# Patient Record
Sex: Male | Born: 1960 | Race: White | Hispanic: No | Marital: Married | Smoking: Former smoker
Health system: Southern US, Community
[De-identification: ages and names within clinical notes are randomized; demographics above are authoritative.]

## PROBLEM LIST (undated history)

## (undated) DIAGNOSIS — K219 Gastro-esophageal reflux disease without esophagitis: Secondary | ICD-10-CM

## (undated) DIAGNOSIS — M1990 Unspecified osteoarthritis, unspecified site: Secondary | ICD-10-CM

## (undated) DIAGNOSIS — T8859XA Other complications of anesthesia, initial encounter: Secondary | ICD-10-CM

## (undated) DIAGNOSIS — E669 Obesity, unspecified: Secondary | ICD-10-CM

## (undated) DIAGNOSIS — T4145XA Adverse effect of unspecified anesthetic, initial encounter: Secondary | ICD-10-CM

## (undated) DIAGNOSIS — G4710 Hypersomnia, unspecified: Secondary | ICD-10-CM

## (undated) DIAGNOSIS — M26609 Unspecified temporomandibular joint disorder, unspecified side: Secondary | ICD-10-CM

## (undated) DIAGNOSIS — G4730 Sleep apnea, unspecified: Secondary | ICD-10-CM

## (undated) DIAGNOSIS — Z8719 Personal history of other diseases of the digestive system: Secondary | ICD-10-CM

## (undated) DIAGNOSIS — I4430 Unspecified atrioventricular block: Secondary | ICD-10-CM

## (undated) DIAGNOSIS — Z973 Presence of spectacles and contact lenses: Secondary | ICD-10-CM

## (undated) DIAGNOSIS — C4490 Unspecified malignant neoplasm of skin, unspecified: Secondary | ICD-10-CM

## (undated) DIAGNOSIS — E119 Type 2 diabetes mellitus without complications: Secondary | ICD-10-CM

## (undated) DIAGNOSIS — E1140 Type 2 diabetes mellitus with diabetic neuropathy, unspecified: Secondary | ICD-10-CM

## (undated) DIAGNOSIS — G43909 Migraine, unspecified, not intractable, without status migrainosus: Secondary | ICD-10-CM

## (undated) DIAGNOSIS — M259 Joint disorder, unspecified: Secondary | ICD-10-CM

## (undated) DIAGNOSIS — I729 Aneurysm of unspecified site: Secondary | ICD-10-CM

## (undated) DIAGNOSIS — J329 Chronic sinusitis, unspecified: Secondary | ICD-10-CM

## (undated) HISTORY — DX: Sleep apnea, unspecified: G47.30

## (undated) HISTORY — DX: Unspecified malignant neoplasm of skin, unspecified: C44.90

## (undated) HISTORY — DX: Hypersomnia, unspecified: G47.10

## (undated) HISTORY — PX: KNEE ARTHROSCOPY: SUR90

## (undated) HISTORY — DX: Type 2 diabetes mellitus without complications: E11.9

## (undated) HISTORY — PX: COLONOSCOPY W/ BIOPSIES AND POLYPECTOMY: SHX1376

## (undated) HISTORY — DX: Migraine, unspecified, not intractable, without status migrainosus: G43.909

## (undated) HISTORY — PX: CARPAL TUNNEL RELEASE: SHX101

## (undated) HISTORY — DX: Unspecified temporomandibular joint disorder, unspecified side: M26.609

## (undated) HISTORY — PX: MOHS SURGERY: SUR867

## (undated) HISTORY — PX: TONSILLECTOMY: SUR1361

## (undated) HISTORY — DX: Joint disorder, unspecified: M25.9

## (undated) HISTORY — PX: LUMBAR DISC SURGERY: SHX700

## (undated) HISTORY — DX: Obesity, unspecified: E66.9

---

## 2008-09-04 DIAGNOSIS — I4430 Unspecified atrioventricular block: Secondary | ICD-10-CM

## 2008-09-04 HISTORY — DX: Unspecified atrioventricular block: I44.30

## 2013-06-10 ENCOUNTER — Telehealth: Payer: Self-pay

## 2013-06-10 NOTE — Telephone Encounter (Signed)
Pt was referred by Dr. Hassan Rowan at [facility] for colonoscopy. I spoke to pt and he said his last one was in [place] 3 years ago and he had polyps ( he doesn't know what kind). [place] Spaniel he was told he would need one every 3 years. He is scheduled an ov with Tana Coast, PA on 07/02/2013 at 10:00 AM.

## 2013-06-11 ENCOUNTER — Telehealth: Payer: Self-pay | Admitting: *Deleted

## 2013-06-11 NOTE — Telephone Encounter (Signed)
Thanks Chelsey!

## 2013-06-11 NOTE — Telephone Encounter (Signed)
I mailed pt a sign of release form, so we can get records from [facility], I also called pt and informed him of the release form, pt states he will get it back to our office, pt is aware we would like to get records before his appt.

## 2013-06-19 ENCOUNTER — Ambulatory Visit (INDEPENDENT_AMBULATORY_CARE_PROVIDER_SITE_OTHER): Payer: BC Managed Care – PPO | Admitting: Otolaryngology

## 2013-06-19 ENCOUNTER — Encounter (INDEPENDENT_AMBULATORY_CARE_PROVIDER_SITE_OTHER): Payer: Self-pay

## 2013-06-19 DIAGNOSIS — J310 Chronic rhinitis: Secondary | ICD-10-CM

## 2013-06-19 DIAGNOSIS — J343 Hypertrophy of nasal turbinates: Secondary | ICD-10-CM

## 2013-07-02 ENCOUNTER — Ambulatory Visit: Payer: Self-pay | Admitting: Gastroenterology

## 2013-07-02 ENCOUNTER — Telehealth: Payer: Self-pay | Admitting: Gastroenterology

## 2013-07-02 ENCOUNTER — Encounter: Payer: Self-pay | Admitting: Gastroenterology

## 2013-07-02 NOTE — Telephone Encounter (Signed)
MAILED LETTER °

## 2013-07-02 NOTE — Telephone Encounter (Signed)
Pt was a no show

## 2013-07-24 ENCOUNTER — Encounter (INDEPENDENT_AMBULATORY_CARE_PROVIDER_SITE_OTHER): Payer: Self-pay

## 2013-07-24 ENCOUNTER — Ambulatory Visit (INDEPENDENT_AMBULATORY_CARE_PROVIDER_SITE_OTHER): Payer: BC Managed Care – PPO | Admitting: Otolaryngology

## 2013-07-24 DIAGNOSIS — J343 Hypertrophy of nasal turbinates: Secondary | ICD-10-CM

## 2013-07-24 DIAGNOSIS — J310 Chronic rhinitis: Secondary | ICD-10-CM

## 2013-08-14 ENCOUNTER — Ambulatory Visit (INDEPENDENT_AMBULATORY_CARE_PROVIDER_SITE_OTHER): Payer: BC Managed Care – PPO | Admitting: Otolaryngology

## 2013-08-14 ENCOUNTER — Encounter (INDEPENDENT_AMBULATORY_CARE_PROVIDER_SITE_OTHER): Payer: Self-pay

## 2013-08-14 DIAGNOSIS — J343 Hypertrophy of nasal turbinates: Secondary | ICD-10-CM

## 2013-08-14 DIAGNOSIS — J310 Chronic rhinitis: Secondary | ICD-10-CM

## 2013-08-21 ENCOUNTER — Other Ambulatory Visit (HOSPITAL_COMMUNITY): Payer: Self-pay | Admitting: Family Medicine

## 2013-08-21 ENCOUNTER — Ambulatory Visit (HOSPITAL_COMMUNITY)
Admission: RE | Admit: 2013-08-21 | Discharge: 2013-08-21 | Disposition: A | Discharge status: Home / Self Care | Payer: BC Managed Care – PPO | Source: Ambulatory Visit | Attending: Family Medicine | Admitting: Family Medicine

## 2013-08-21 DIAGNOSIS — C4491 Basal cell carcinoma of skin, unspecified: Secondary | ICD-10-CM

## 2013-08-21 DIAGNOSIS — M542 Cervicalgia: Secondary | ICD-10-CM

## 2013-08-21 DIAGNOSIS — R209 Unspecified disturbances of skin sensation: Secondary | ICD-10-CM | POA: Insufficient documentation

## 2013-08-21 DIAGNOSIS — M5030 Other cervical disc degeneration, unspecified cervical region: Secondary | ICD-10-CM | POA: Insufficient documentation

## 2013-08-21 DIAGNOSIS — R200 Anesthesia of skin: Secondary | ICD-10-CM

## 2013-09-08 ENCOUNTER — Ambulatory Visit (INDEPENDENT_AMBULATORY_CARE_PROVIDER_SITE_OTHER): Payer: BC Managed Care – PPO | Admitting: Neurology

## 2013-09-08 ENCOUNTER — Encounter (INDEPENDENT_AMBULATORY_CARE_PROVIDER_SITE_OTHER): Payer: Self-pay

## 2013-09-08 ENCOUNTER — Encounter: Payer: Self-pay | Admitting: Neurology

## 2013-09-08 VITALS — BP 139/84 | HR 85 | Resp 17 | Ht 68.0 in | Wt 282.0 lb

## 2013-09-08 DIAGNOSIS — G4710 Hypersomnia, unspecified: Secondary | ICD-10-CM

## 2013-09-08 DIAGNOSIS — E662 Morbid (severe) obesity with alveolar hypoventilation: Secondary | ICD-10-CM

## 2013-09-08 DIAGNOSIS — G4730 Sleep apnea, unspecified: Principal | ICD-10-CM

## 2013-09-08 HISTORY — DX: Hypersomnia, unspecified: G47.10

## 2013-09-08 NOTE — Patient Instructions (Signed)
Calorie Counting Diet A calorie counting diet requires you to eat the number of calories that are right for you in a day. Calories are the measurement of how much energy you get from the food you eat. Eating the right amount of calories is important for staying at a healthy weight. If you eat too many calories, your body will store them as fat and you may gain weight. If you eat too few calories, you may lose weight. Counting the number of calories you eat during a day will help you know if you are eating the right amount. A Registered Dietitian can determine how many calories you need in a day. The amount of calories needed varies from person to person. If your goal is to lose weight, you will need to eat fewer calories. Losing weight can benefit you if you are overweight or have health problems such as heart disease, high blood pressure, or diabetes. If your goal is to gain weight, you will need to eat more calories. Gaining weight may be necessary if you have a certain health problem that causes your body to need more energy. TIPS Whether you are increasing or decreasing the number of calories you eat during a day, it may be hard to get used to changes in what you eat and drink. The following are tips to help you keep track of the number of calories you eat.  Measure foods at home with measuring cups. This helps you know the amount of food and number of calories you are eating.  Restaurants often serve food in amounts that are larger than 1 serving. While eating out, estimate how many servings of a food you are given. For example, a serving of cooked rice is  cup or about the size of half of a fist. Knowing serving sizes will help you be aware of how much food you are eating at restaurants.  Ask for smaller portion sizes or child-size portions at restaurants.  Plan to eat half of a meal at a restaurant. Take the rest home or share the other half with a friend.  Read the Nutrition Facts panel on  food labels for calorie content and serving size. You can find out how many servings are in a package, the size of a serving, and the number of calories each serving has.  For example, a package might contain 3 cookies. The Nutrition Facts panel on that package says that 1 serving is 1 cookie. Below that, it will say there are 3 servings in the container. The calories section of the Nutrition Facts label says there are 90 calories. This means there are 90 calories in 1 cookie (1 serving). If you eat 1 cookie you have eaten 90 calories. If you eat all 3 cookies, you have eaten 270 calories (3 servings x 90 calories = 270 calories). The list below tells you how big or small some common portion sizes are.  1 oz.........4 stacked dice.  3 oz.........Deck of cards.  1 tsp........Tip of little finger.  1 tbs........Thumb.  2 tbs........Golf ball.   cup.......Half of a fist.  1 cup........A fist. KEEP A FOOD LOG Write down every food item you eat, the amount you eat, and the number of calories in each food you eat during the day. At the end of the day, you can add up the total number of calories you have eaten. It may help to keep a list like the one below. Find out the calorie information by reading the   Nutrition Facts panel on food labels. Breakfast  Bran cereal (1 cup, 110 calories).  Fat-free milk ( cup, 45 calories). Snack  Apple (1 medium, 80 calories). Lunch  Spinach (1 cup, 20 calories).  Tomato ( medium, 20 calories).  Chicken breast strips (3 oz, 165 calories).  Shredded cheddar cheese ( cup, 110 calories).  Light Italian dressing (2 tbs, 60 calories).  Whole-wheat bread (1 slice, 80 calories).  Tub margarine (1 tsp, 35 calories).  Vegetable soup (1 cup, 160 calories). Dinner  Pork chop (3 oz, 190 calories).  Brown rice (1 cup, 215 calories).  Steamed broccoli ( cup, 20 calories).  Strawberries (1  cup, 65 calories).  Whipped cream (1 tbs, 50  calories). Daily Calorie Total: 1425 Document Released: 08/21/2005 Document Revised: 11/13/2011 Document Reviewed: 02/15/2007 ExitCare Patient Information 2014 [employer].  

## 2013-09-08 NOTE — Progress Notes (Signed)
[facility]  Provider:  Larey Seat, M D  Referring Provider: Rocky Morel* Primary Care Physician:  Rocky Morel, MD  Chief Complaint  Patient presents with  . OSA    NP, paper referral, Rm 10    HPI:  Nicholas Wheeler is a 53 y.o. male  Is seen here as a referral   from Dr. Ethlyn Gallery for a sleep consultation.   Mr. Nicholas Wheeler on reports today that his primary care physician is concerned about having a high likelihood of sleep apnea. Part of the high risk factor is his body mass index, but also his diabetes type 2. The patient has no history of a pulmonary disorder. He sometimes has suffered hypoglycemic episodes in the past when his blood sugars would drop to the 60s with physical activity. He also had left arm and hand numbness while sitting reported last December. This has been attributed to degenerative disc disease at the neck.  Patient had been diagnosed with sleep apnea as far back as the 1990s and has been using of asthma machine ever since. His main concern at that time was loud snoring that have bothered his wife. He had been seen at the [facility] and titrated to CPAP in 1998. 4 years ago he was evaluated at [facility] here in [place] for sleep disordered breathing. A split night polysomnography was performed on 01-24-10. Diagnostic portion revealed an AHI of 49. 62 obstructive apneas were noted. Oxygen nadir was 88%. He was fitted with an extra large ResMed Mirage full face mask was added humidity. A pressure of 14 cm water was prescribed and the patient had reported feeling more rested when using it. His diagnosis was severe obstructive sleep apnea syndrome with hypersomnia.  The patient CPAP machine has been well failed to serve him well, he reports that the humidifier is not longer working, but the machine shut itself off in the middle of the night. In other words he is no longer relying. He had an older machine available at home  and has record of this in the meanwhile to serve him. 14 cm water is used currently.  The patient  definitely needs a new  CPAP machine, it is a question if he needs a new sleep study or not to justify and qualify for a new machine.  The patient works from [employer] for [employer], and also he is regularly asked to work for 8 hours, he reports frequently working from 9 AM to up to 1 AM and he still has to also visit Customers on call. He estimates his overall date time of sleep at about 7 hours. He has not time to now ulcer he has a desire and feels that he would love to. He drinks caffeinated beverages, mainly in the morning 2 cups of coffee , he drinks on sweetened iced tea in p.m. 1 or 2 glasses, and does not drink sodas.  He is using CPAP each night, after going  to bed he will fall asleep quickly, currently his sleep is often interrupted by the mechanical difficulties of the CPAP machine. Before this problem a rules however he would sleep through the reports nor nocturia. He will sometimes rarely wake up as a morning headache, usually a bifrontal headache. He would wake up as a dry mouth in spite of using the humidifier.  He could never tolerate nasal pillows partially due to the pressure requirements , he had developed epistaxis and irritation of the nostrils.  Rhinitis  in season, he takes Zyrtec.    Family history of sleep disorders, mother has CPAP for 3 decades, OSA. Brothers have OSA.    The patient is interested in weight loss surgery.     Review of Systems: Out of a complete 14 system review, the patient complains of only the following symptoms, and all other reviewed systems are negative. Nasal rhinitis, Epworth on CPAP at  7 points,  FSS 58 points, GDS 3 points.   History   Social History  . Marital Status: Married    Spouse Name: elizabeth    Number of Children: 2  . Years of Education: [place]   Occupational History  . [employer]    Social History Main Topics  . Smoking status: Never  Smoker   . Smokeless tobacco: Not on file  . Alcohol Use: Yes  . Drug Use: No  . Sexual Activity: Not on file   Other Topics Concern  . Not on file   Social History Narrative  . No narrative on file    History reviewed. No pertinent family history.  Past Medical History  Diagnosis Date  . Diabetes   . Skin cancer   . Joint disorder     degenerated  . Migraine   . TMJ (temporomandibular joint disorder)   . Obesity (BMI 35.0-39.9 without comorbidity)   . Hypersomnia with sleep apnea, unspecified 09/08/2013    Past Surgical History  Procedure Laterality Date  . Lumbar disc surgery    . Knee arthroscopy    . Carpal tunnel release    . Tonsillectomy    . Mole surgery      Current Outpatient Prescriptions  Medication Sig Dispense Refill  . glipiZIDE (GLUCOTROL) 5 MG tablet Take 5 mg by mouth daily.      Marland Kitchen losartan (COZAAR) 25 MG tablet Take 25 mg by mouth daily.      . metFORMIN (GLUCOPHAGE) 500 MG tablet Take 500 mg by mouth 2 (two) times daily.      . montelukast (SINGULAIR) 10 MG tablet Take 10 mg by mouth daily.      . QNASL 80 MCG/ACT AERS Place 80 mcg into the nose daily.       No current facility-administered medications for this visit.    Allergies as of 09/08/2013 - Review Complete 09/08/2013  Allergen Reaction Noted  . Keflex [cephalexin]  09/08/2013    Vitals: BP 139/84  Pulse 85  Resp 17  Ht 5\' 8"  (1.727 m)  Wt 282 lb (127.914 kg)  BMI 42.89 kg/m2 Last Weight:  Wt Readings from Last 1 Encounters:  09/08/13 282 lb (127.914 kg)   Last Height:   Ht Readings from Last 1 Encounters:  09/08/13 5\' 8"  (1.727 m)    Physical exam:  General: The patient is awake, alert and appears not in acute distress. The patient is well groomed. Head: Normocephalic, atraumatic. Neck is supple. Mallampati 4, neck circumference: 20 inches .  Cardiovascular:  Regular rate and rhythm, without  murmurs or carotid bruit, and without distended neck veins. Respiratory:  Lungs are clear to auscultation. Skin:  Without evidence of edema, or rash Trunk: BMI is elevated and patient  has normal posture.  Neurologic exam : The patient is awake and alert, oriented to place and time.  Memory subjective described as intact.  There is a normal attention span & concentration ability. Speech is fluent without dysarthria, dysphonia or aphasia.  Mood and affect are appropriate.  Cranial nerves: Pupils are equal and  briskly reactive to light. Funduscopic exam without  evidence of pallor or edema. Extraocular movements  in vertical and horizontal planes intact and without nystagmus. Visual fields by finger perimetry are intact. Hearing to finger rub intact.  Facial sensation intact to fine touch. Facial motor strength is symmetric and tongue and uvula move midline.  Motor exam:   Normal tone and normal muscle bulk and symmetric normal strength in all extremities.  Sensory:  Fine touch, pinprick and vibration were tested in all extremities. Proprioception is normal.  Coordination: Rapid alternating movements in the fingers/hands is tested and normal, without evidence of ataxia, dysmetria or tremor.  Gait and station: Patient walks without assistive device . Deep tendon reflexes: in the  upper and lower extremities are symmetric and intact. Left knee had surgery - partial meniscus and ACL repair.   Babinski maneuver response is downgoing.   Assessment:  After physical and neurologic examination, review of laboratory studies, imaging, neurophysiology testing and pre-existing records, assessment is  1) OSA with well documented history, since last SPLIT study there was  an increase in CPA pressure needed to 14 cm water.   He likes FFM, and he likes CPAP. Has TMJ and didn't want to persue alternatives after a dental device failed.    Plan:  Treatment plan and additional workup :  If no Sleep study for requalification is needed ( according to [facility] ), I will be happy to  prescribe an S 10 machine for the patient.  If needed, he will undergo a SPLIt study , 3 % score and AHi of 15 or higher to be titrated. Supine sleep, CO2.   weight loss advise discussed and questions answered, visit 45 minutes,

## 2013-12-25 ENCOUNTER — Telehealth: Payer: Self-pay | Admitting: Neurology

## 2013-12-25 NOTE — Telephone Encounter (Signed)
Pt called back wanted to see what was going on, states the pharmacy says they have not recd anything. Janett Billow can you tell me who we need to refer this pt over to concerning this matter. Thanks

## 2013-12-25 NOTE — Telephone Encounter (Signed)
Patient called to state that he is going out of the [place] tomorrow morning and needs to get his CPAP script sorted out. Please return call to patient.

## 2013-12-25 NOTE — Telephone Encounter (Signed)
Pt's CPAP prescription and notes were faxed to [facility].  Fax confirmation was received.  Pt is aware.

## 2015-05-31 ENCOUNTER — Ambulatory Visit (INDEPENDENT_AMBULATORY_CARE_PROVIDER_SITE_OTHER): Payer: BLUE CROSS/BLUE SHIELD | Admitting: Diagnostic Neuroimaging

## 2015-05-31 ENCOUNTER — Encounter: Payer: Self-pay | Admitting: *Deleted

## 2015-05-31 VITALS — BP 141/87 | HR 75 | Ht 68.0 in | Wt 256.4 lb

## 2015-05-31 DIAGNOSIS — G43909 Migraine, unspecified, not intractable, without status migrainosus: Secondary | ICD-10-CM | POA: Diagnosis not present

## 2015-05-31 DIAGNOSIS — H532 Diplopia: Secondary | ICD-10-CM | POA: Diagnosis not present

## 2015-05-31 DIAGNOSIS — G43109 Migraine with aura, not intractable, without status migrainosus: Secondary | ICD-10-CM | POA: Diagnosis not present

## 2015-05-31 NOTE — Progress Notes (Signed)
[facility]  PATIENT: Nicholas Wheeler DOB: 08-04-1961  REFERRING CLINICIAN: Koberlein HISTORY FROM: patient and wife REASON FOR VISIT: new consult    HISTORICAL  CHIEF COMPLAINT:  Chief Complaint  Patient presents with  . Migraine    rm 7, New Patient, wife- Becky    HISTORY OF PRESENT ILLNESS:   54 year old right-handed male with history of diabetes, migraine, basal cell skin cancer, chronic back pain, here for evaluation of double vision and migraine headaches.  In the 1990s patient had onset of severe headaches. He had bilateral frontal, vertex and occipital headaches, vision changes, throbbing pain, nausea, sweating, migratory numbness, photophobia, phonophobia, sometimes word finding difficulties associated with these headaches. Ultimately he was diagnosed with migraine headaches. He was evaluated at [facility] at that time. He was tried on Imitrex which she could not tolerate due to side effects. He tried Topamax but this was ineffective. 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 Headaches have continued intermittently since they 1990s. Sometimes he will have one headache per year, other times he will have up to 15-30 days of headache in a row. In the past 2 years he is averaging 1 migraine attack every 3 months, each attack lasting up to 2 days. Triggering factors include flashing lights, computer screens, Parmesan cheese.  In the past 3 weeks patient has had constant headache with severe pain, increasing vision changes. He has not been able to work in the past 3 weeks. He typically spends up to 16 hours per day in front of her computer screen.  Also of note for the past 3 years patient has had onset of constant double vision of all objects in his visual fields, near and far. Double vision is persistent when he closes either the right eye, left eye or keeps both eyes open. The difference in distance between the  2 images, sometimes 3 or 4 images that he sees, changes depending on random factors versus increasing headaches. Sometimes if he focuses on the person's face, and the double vision is too intense, this "triggers a migraine". He has seen optometry in the past for eye issues, but not ophthalmology.  Patient also has diagnosis of sleep apnea, on CPAP therapy. He saw my colleague Dr. Brett Fairy in 2015 for this issue.  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 Since coming to [place] he has weaned off of this medication due to apparent difficulties in regular urine drug screening, follow up appts and associated cost.   REVIEW OF SYSTEMS: Full 14 system review of systems performed and notable only for fatigue swelling in legs double vision headache dizziness snoring joint pain allergies decreased energy.   ALLERGIES: Allergies  Allergen Reactions  . Keflex [Cephalexin] Swelling    Throat swelling  . Verapamil Other (See Comments)    AV block    HOME MEDICATIONS: Outpatient Prescriptions Prior to Visit  Medication Sig Dispense Refill  . metFORMIN (GLUCOPHAGE) 500 MG tablet Take 500 mg by mouth 2 (two) times daily.    Marland Kitchen glipiZIDE (GLUCOTROL) 5 MG tablet Take 5 mg by mouth daily.    Marland Kitchen losartan (COZAAR) 25 MG tablet Take 25 mg by mouth daily.    . montelukast (SINGULAIR) 10 MG tablet Take 10 mg by mouth daily.    . QNASL 80 MCG/ACT AERS Place 80 mcg into the  nose daily.     No facility-administered medications prior to visit.    PAST MEDICAL HISTORY: Past Medical History  Diagnosis Date  . Diabetes   . Skin cancer   . Joint disorder     degenerated  . Migraine   . TMJ (temporomandibular joint disorder)   . Obesity (BMI 35.0-39.9 without comorbidity)   . Hypersomnia with sleep apnea, unspecified 09/08/2013  . Migraine   . Sleep apnea     saw Dr Brett Fairy  in 09/2013    PAST SURGICAL HISTORY: Past Surgical History  Procedure Laterality Date  . Lumbar disc surgery    . Knee arthroscopy    . Carpal tunnel release    . Tonsillectomy    . Mole surgery      FAMILY HISTORY: Family History  Problem Relation Age of Onset  . Seizures Mother   . Schizophrenia Father   . Cancer Paternal Grandfather     SOCIAL HISTORY:  Social History   Social History  . Marital Status: Married    Spouse Name: elizabeth  . Number of Children: 2  . Years of Education: [name]   Occupational History  . [employer]    Social History Main Topics  . Smoking status: Former Research scientist (life sciences)  . Smokeless tobacco: Not on file     Comment: 05/31/15 quit 29 yrs ago  . Alcohol Use: Yes  . Drug Use: No  . Sexual Activity: Not on file   Other Topics Concern  . Not on file   Social History Narrative   Lives at home with wife    Caffeine use- coffee, 5 cups daily     PHYSICAL EXAM  GENERAL EXAM/CONSTITUTIONAL: Vitals:  Filed Vitals:   05/31/15 1444  BP: 141/87  Pulse: 75  Height: 5\' 8"  (1.727 m)  Weight: 256 lb 6.4 oz (116.302 kg)     Body mass index is 38.99 kg/(m^2).  Visual Acuity Screening   Right eye Left eye Both eyes  Without correction:     With correction: 20/40 20/70      Patient is in no distress; well developed, nourished and groomed; neck is supple  CARDIOVASCULAR:  Examination of carotid arteries is normal; no carotid bruits  Regular rate and rhythm, no murmurs  Examination of peripheral vascular system by observation and palpation is normal  EYES:  Ophthalmoscopic exam of optic discs and posterior segments is normal; no papilledema or hemorrhages  MUSCULOSKELETAL:  Gait, strength, tone, movements noted in Neurologic exam below  NEUROLOGIC: MENTAL STATUS:  No flowsheet data found.  awake, alert, oriented to person, place and time  recent and remote memory intact  normal attention and concentration  language fluent,  comprehension intact, naming intact,   fund of knowledge appropriate  CRANIAL NERVE:   2nd - no papilledema on fundoscopic exam  2nd, 3rd, 4th, 6th - pupils equal and reactive to light, visual fields full to confrontation, extraocular muscles intact, no nystagmus; SUBJECTIVE MONOCULAR AND BINOCULAR DOUBLE VISION (HORIZONTAL AND VERTICAL); SOMETIMES INTERMITTENT TRIPLE AND QUADRUPLE VISION (MONOCULAR AND BINOCULAR)  5th - facial sensation symmetric  7th - facial strength symmetric  8th - hearing intact  9th - palate elevates symmetrically, uvula midline  11th - shoulder shrug symmetric  12th - tongue protrusion midline  MOTOR:   normal bulk and tone, full strength in the BUE, BLE  SENSORY:   normal and symmetric to light touch, temperature, vibration; DECR PP IN THE FEET  COORDINATION:   finger-nose-finger, fine finger movements normal  REFLEXES:   deep tendon reflexes TRACE and symmetric  GAIT/STATION:   narrow based gait; romberg is negative    DIAGNOSTIC DATA (LABS, IMAGING, TESTING) - I reviewed patient records, labs, notes, testing and imaging myself where available.  No results found for: WBC, HGB, HCT, MCV, PLT No results found for: NA, K, CL, CO2, GLUCOSE, BUN, CREATININE, CALCIUM, PROT, ALBUMIN, AST, ALT, ALKPHOS, BILITOT, GFRNONAA, GFRAA No results found for: CHOL, HDL, LDLCALC, LDLDIRECT, TRIG, CHOLHDL No results found for: HGBA1C No results found for: VITAMINB12 No results found for: TSH  MRI BRAIN - per patient, had "normal" MRI brain and "vein mapping" at [facility] around 2000.    ASSESSMENT AND PLAN  54 y.o. year old male here with chronic persistent, fluctuating intensity double vision, since past 3-5 years. Also with migraine with aura since 0370W, including complicated migraine. In last 3 weeks has had more intense severe headaches, constant. Other factors include diabetes, obesity, hypertension, obstructive sleep apnea, sedentary  lifestyle.  Ddx: migraine phenomenon vs neuro-muscular junction d/o vs other CNS etiology   Double vision - Plan: MR Brain W Wo Contrast, Acetylcholine Receptor, Binding  Migraine with aura and without status migrainosus, not intractable - Plan: MR Brain W Wo Contrast  Complicated migraine - Plan: MR Brain W Wo Contrast    PLAN: - check MRI brain - check AchR ab - refer to neuro-ophthalmology ([facility]) for chronic diplopia issues (mono-ocular and binocular) - healthy lifestyle habits reviewed with patient  Orders Placed This Encounter  Procedures  . MR Brain W Wo Contrast  . Acetylcholine Receptor, Binding   Return in about 3 months (around 08/30/2015).    Penni Bombard, MD 8/88/9169, 4:50 PM Certified in Neurology, Neurophysiology and Neuroimaging  [facility] [address] [place], [place] [address] [phone]

## 2015-05-31 NOTE — Patient Instructions (Addendum)
Thank you for coming to see [place] at [facility]. I hope we have been able to provide you high quality care today.  You may receive a patient satisfaction survey over the next few weeks. We would appreciate your feedback and comments so that we may continue to improve ourselves and the health of our patients.  - check MRI brain  - check labs - I will setup referral to neuro-ophthalmolgy - continue current medications   ~~~~~~~~~~~~~~~~~~~~~~~~~~~~~~~~~~~~~~~~~~~~~~~~~~~~~~~~~~~~~~~~~  DR. PENUMALLI'S GUIDE TO HAPPY AND HEALTHY LIVING These are some of my general health and wellness recommendations. Some of them may apply to you better than others. Please use common sense as you try these suggestions and feel free to ask me any questions.   ACTIVITY/FITNESS Mental, social, emotional and physical stimulation are very important for brain and body health. Try learning a new activity (arts, music, language, sports, games).  Keep moving your body to the best of your abilities. You can do this at home, inside or outside, the park, community center, gym or anywhere you like. Consider a physical therapist or personal trainer to get started. Consider the app Sworkit. Fitness trackers such as smart-watches, smart-phones or Fitbits can help as well.   NUTRITION Eat more plants: colorful vegetables, nuts, seeds and berries.  Eat less sugar, salt, preservatives and processed foods.  Avoid toxins such as cigarettes and alcohol.  Drink water when you are thirsty. Warm water with a slice of lemon is an excellent morning drink to start the day.  Consider these websites for more information The Nutrition Source (https://www.henry-hernandez.biz/) Precision Nutrition (WindowBlog.ch)   RELAXATION Consider practicing mindfulness meditation or other relaxation techniques such as deep breathing, prayer, yoga, tai chi, massage. See website  mindful.org or the apps Headspace or Calm to help get started.   SLEEP Try to get at least 7-8+ hours sleep per day. Regular exercise and reduced caffeine will help you sleep better. Practice good sleep hygeine techniques. See website sleep.org for more information.   PLANNING Prepare estate planning, living will, healthcare POA documents. Sometimes this is best planned with the help of an attorney. Theconversationproject.org and agingwithdignity.org are excellent resources.

## 2015-06-02 ENCOUNTER — Telehealth: Payer: Self-pay | Admitting: *Deleted

## 2015-06-02 LAB — ACETYLCHOLINE RECEPTOR, BINDING: ACHR BINDING AB, SERUM: 0.1 nmol/L (ref 0.00–0.24)

## 2015-06-02 NOTE — Telephone Encounter (Signed)
Left vm informing patient, per Dr Leta Baptist, his lab result is normal. Left this caller's name, number for questions.

## 2015-06-02 NOTE — Telephone Encounter (Signed)
-----   Message from Penni Bombard, MD sent at 06/02/2015  7:51 AM EDT ----- pls call with normal lab result. -VRP

## 2015-06-23 ENCOUNTER — Ambulatory Visit (INDEPENDENT_AMBULATORY_CARE_PROVIDER_SITE_OTHER): Payer: BLUE CROSS/BLUE SHIELD

## 2015-06-23 DIAGNOSIS — H532 Diplopia: Secondary | ICD-10-CM

## 2015-06-23 DIAGNOSIS — G43909 Migraine, unspecified, not intractable, without status migrainosus: Secondary | ICD-10-CM | POA: Diagnosis not present

## 2015-06-23 DIAGNOSIS — G43109 Migraine with aura, not intractable, without status migrainosus: Secondary | ICD-10-CM

## 2015-06-24 MED ORDER — GADOPENTETATE DIMEGLUMINE 469.01 MG/ML IV SOLN: 20.0000 mL | Freq: Once | INTRAVENOUS | Status: AC | PRN

## 2015-06-29 ENCOUNTER — Telehealth: Payer: Self-pay | Admitting: *Deleted

## 2015-06-29 NOTE — Telephone Encounter (Signed)
Spoke with patient and informed him, per Dr Leta Baptist, his MRI results are normal. He verbalized understanding. He inquired about his referral to opthalmology. Informed him his referral was sent to [facility]  on 06/02/15. He requests this be followed up on and that he be called back. Will route to [employer] to FU on this referral.

## 2015-06-30 ENCOUNTER — Other Ambulatory Visit: Payer: Self-pay | Admitting: *Deleted

## 2015-06-30 DIAGNOSIS — H532 Diplopia: Secondary | ICD-10-CM

## 2015-06-30 NOTE — Telephone Encounter (Signed)
Patient has apt with Dr. Dr. Katy Fitch 08/01/2015. At 1:45pm Patient has been called and aware of details.

## 2015-06-30 NOTE — Telephone Encounter (Signed)
Spoke with patient and informed him a new referral has been placed to [facility]. Informed him Rance Muir will call today to make his apt and call him with date/time. He verbalized understanding, appreciation.

## 2015-07-07 NOTE — H&P (Signed)
  NTS SOAP Note  Vital Signs:  Vitals as of: 00/09/7492: Systolic 496: Diastolic 96: Heart Rate 69: Temp 97.70F: Height 68ft 7.5in: Weight 262Lbs 0 Ounces: BMI 40.43  BMI : 40.43 kg/m2  Subjective: This 54 year old male presents for of personal h/o colon polyps.  Last had a TCS four years ago.  Denies any gi complaints.  No family h/o colon carcinoma.  Review of Symptoms:  Constitutional:fatigue headache Eyes:blurred vision bilateral sinus problems Cardiovascular:  unremarkable Respiratory:unremarkable Gastrointestinal:  unremarkable   Genitourinary:unremarkable   joint, neck, and back pain Skin:unremarkable Hematolgic/Lymphatic:unremarkable   Allergic/Immunologic:unremarkable   Past Medical History:  Reviewed  Past Medical History  Surgical History: knee, carpal tunnel Medical Problems: colon polyps, NIDDM Allergies: keflex, verapmainl, old ct dye Medications: propranolol, glucophage, zyrtect   Social History:Reviewed  Social History  Preferred Language: English Race:  White Ethnicity: Not Hispanic / Latino Age: 54 year Marital Status:  M Alcohol: no   Smoking Status: Never smoker reviewed on 07/06/2015 Functional Status reviewed on 07/06/2015 ------------------------------------------------ Bathing: Normal Cooking: Normal Dressing: Normal Driving: Normal Eating: Normal Managing Meds: Normal Oral Care: Normal Shopping: Normal Toileting: Normal Transferring: Normal Walking: Normal Cognitive Status reviewed on 07/06/2015 ------------------------------------------------ Attention: Normal Decision Making: Normal Language: Normal Memory: Normal Motor: Normal Perception: Normal Problem Solving: Normal Visual and Spatial: Normal   Family History:Reviewed  Family Health History Mother, Living; Congestive heart failure;  Father, Living; Healthy;     Objective Information: General:Well appearing, well nourished in no  distress. Heart:RRR, no murmur Lungs:  CTA bilaterally, no wheezes, rhonchi, rales.  Breathing unlabored. Abdomen:Soft, NT/ND, no HSM, no masses. deferred to procedure  Assessment:colon polyps  Diagnoses: V12.72  Z86.010 History of polyp of colon (Personal history of colonic polyps)  Procedures: 75916 - OFFICE OUTPATIENT NEW 20 MINUTES    Plan:  Scheduled for TCS on 07/20/15.   Patient Education:Alternative treatments to surgery were discussed with patient (and family).  Risks and benefits  of procedure including bleeding and perforation were fully explained to the patient (and family) who gave informed consent. Patient/family questions were addressed.  Follow-up:Pending Surgery

## 2015-07-08 ENCOUNTER — Encounter: Payer: Self-pay | Admitting: Diagnostic Neuroimaging

## 2015-07-12 NOTE — Telephone Encounter (Signed)
I called patient x 2. No answer.

## 2015-07-16 ENCOUNTER — Encounter: Payer: Self-pay | Admitting: Diagnostic Neuroimaging

## 2015-07-16 DIAGNOSIS — G43109 Migraine with aura, not intractable, without status migrainosus: Secondary | ICD-10-CM

## 2015-07-19 NOTE — Telephone Encounter (Signed)
I called patient. Still with intermittent visual disturbances (seeing flashing lights). Still with intermittent headaches. May represent migraine phenomenon. He is not able to work. Still with constellation of symptoms. Will set up second opinion at [facility] neurology.    Penni Bombard, MD [id number], [id number] PM Certified in Neurology, Neurophysiology and Neuroimaging  [facility] [address] [place], [place] [address] [phone]

## 2015-07-20 ENCOUNTER — Ambulatory Visit (HOSPITAL_COMMUNITY)
Admission: RE | Admit: 2015-07-20 | Payer: BLUE CROSS/BLUE SHIELD | Source: Ambulatory Visit | Admitting: General Surgery

## 2015-07-20 ENCOUNTER — Encounter (HOSPITAL_COMMUNITY): Admission: RE | Payer: Self-pay | Source: Ambulatory Visit

## 2015-07-20 SURGERY — COLONOSCOPY
Anesthesia: Moderate Sedation

## 2015-08-04 ENCOUNTER — Telehealth: Payer: Self-pay | Admitting: *Deleted

## 2015-08-04 ENCOUNTER — Encounter: Payer: Self-pay | Admitting: Diagnostic Neuroimaging

## 2015-08-04 ENCOUNTER — Encounter: Payer: Self-pay | Admitting: *Deleted

## 2015-08-04 NOTE — Telephone Encounter (Signed)
Spoke with patient and informed him, per Dr Leta Baptist that he will wait for second opinion before considering LP. Patient verbalized understanding, appreciation for call.

## 2015-08-04 NOTE — Telephone Encounter (Signed)
Spoke with patient re: My Chart message requesting information, last notes for his work.  Patient states he needs notes from Dr Katy Fitch; informed him this office cannot release notes from other physician practices, and advised he call Dr Zenia Resides office and request. He inquired about LP, stating he and Dr Leta Baptist discussed that on the phone. Informed him that Dr Leta Baptist is likely going to wait until patient has second opinion at with [facility] in order to prevent having patient go through invasive procedure unnecessarily.  Patient requested this RN verify with Dr Leta Baptist and call him back. Will discuss with dr.

## 2015-08-04 NOTE — Telephone Encounter (Signed)
Hold off on LP. Recommend second opinion first. -VRP

## 2015-09-07 ENCOUNTER — Ambulatory Visit: Payer: BLUE CROSS/BLUE SHIELD | Admitting: Diagnostic Neuroimaging

## 2015-09-09 ENCOUNTER — Ambulatory Visit: Payer: BLUE CROSS/BLUE SHIELD | Admitting: Diagnostic Neuroimaging

## 2015-09-09 ENCOUNTER — Telehealth: Payer: Self-pay | Admitting: Diagnostic Neuroimaging

## 2015-09-09 NOTE — Telephone Encounter (Signed)
Pt called and says he is running a fever and will not come in for his appt today. He wants to know if the physician can call him to discuss his referral to another physician . Please call and advise [phone]

## 2015-09-10 NOTE — Telephone Encounter (Signed)
I called patient. I reviewed outside neurologist consultation note from Dr. Verlin Fester (headache specialist, [facility]) with patient. I agree with outside neurology assessment. Although patient may have some amount of migraine headaches, I do not think his reading, focusing and vision problems are migraine related at this time. They do not fit with any particular typical neurologic disease pattern or neuro anatomical correlate. Therefore possibility of conversion reaction or stress related etiology should be raised. Patient does not report any specific overt psychological stress factors.  In terms of migraine headache management, he has failed Imitrex, verapamil, topiramate, propranolol, Fioricet, amitriptyline, nortriptyline, Atarax. Headache specialist also had no further medication treatment options at this time. Unfortunately I do not have additional medications to try for patient to help his headaches or his vision symptoms.  At this point I recommend follow-up with his PCP. They may consider referral to psychiatrist or psychologist to explore possibility of deep-seated or undiagnosed stress/psychological factors. Patient agree with plan.   Penni Bombard, MD [id number], [id number] PM Certified in Neurology, Neurophysiology and Neuroimaging  [facility] [address], [place], [place] [address] [phone]

## 2015-09-10 NOTE — Telephone Encounter (Signed)
Called home phone, no answer. Called mobile number and spoke with pt who states he does not want referral to another dr, but wants to discuss his apt with [facility] dr with Dr Leta Baptist. He states he "wants to know what to do next " for his headaches.  Scheduled him for FU with Dr Leta Baptist. He then stated [facility] dr prescribed Hydroxizine HCL 10 mg for his headaches. Pt states it does not help HA at all but gives him relief from sinus pressure and allergy symptoms. He requests refill on Fioricet today, stating it is helpful, pharmacy verified. Informed him would route to Dr Leta Baptist. He verbalized understanding, appreciation.

## 2015-09-14 ENCOUNTER — Encounter: Payer: Self-pay | Admitting: Diagnostic Neuroimaging

## 2015-09-15 ENCOUNTER — Telehealth: Payer: Self-pay | Admitting: Diagnostic Neuroimaging

## 2015-09-15 NOTE — Telephone Encounter (Signed)
Appointment canceled.

## 2015-09-15 NOTE — Telephone Encounter (Signed)
I called patient and advised him to follow up with PCP. -VRP

## 2015-09-15 NOTE — Telephone Encounter (Signed)
Spoke with patient and informed him per Dr Leta Baptist, he recommends patient FU with PCP for other possible referrals. Patient stated that he does have apt with PCP but wants to see Dr Leta Baptist on his currently scheduled FU on 09/23/15 to discuss "the unknown issue in the focal center". He stated "both Dr Katy Fitch and Dr Cena Benton agreed on that." Patient stated he was not going to see a psychologist, stating  "that is not the issue".  Patient verbalized appreciation for call.

## 2015-09-23 ENCOUNTER — Ambulatory Visit: Payer: Self-pay | Admitting: Diagnostic Neuroimaging

## 2015-10-27 ENCOUNTER — Encounter: Payer: Self-pay | Admitting: Diagnostic Neuroimaging

## 2015-11-25 ENCOUNTER — Encounter: Payer: Self-pay | Admitting: Diagnostic Neuroimaging

## 2015-12-03 ENCOUNTER — Other Ambulatory Visit (HOSPITAL_COMMUNITY): Payer: Self-pay | Admitting: Interventional Radiology

## 2015-12-03 DIAGNOSIS — I729 Aneurysm of unspecified site: Secondary | ICD-10-CM

## 2015-12-17 ENCOUNTER — Ambulatory Visit (HOSPITAL_COMMUNITY)
Admission: RE | Admit: 2015-12-17 | Discharge: 2015-12-17 | Disposition: A | Discharge status: Home / Self Care | Payer: BLUE CROSS/BLUE SHIELD | Source: Ambulatory Visit | Attending: Interventional Radiology | Admitting: Interventional Radiology

## 2015-12-17 DIAGNOSIS — I729 Aneurysm of unspecified site: Secondary | ICD-10-CM

## 2015-12-17 DIAGNOSIS — I671 Cerebral aneurysm, nonruptured: Secondary | ICD-10-CM | POA: Diagnosis not present

## 2015-12-17 DIAGNOSIS — R51 Headache: Secondary | ICD-10-CM | POA: Diagnosis not present

## 2015-12-23 ENCOUNTER — Other Ambulatory Visit (HOSPITAL_COMMUNITY): Payer: Self-pay | Admitting: Interventional Radiology

## 2015-12-23 DIAGNOSIS — I729 Aneurysm of unspecified site: Secondary | ICD-10-CM

## 2015-12-30 DIAGNOSIS — H539 Unspecified visual disturbance: Secondary | ICD-10-CM | POA: Diagnosis not present

## 2015-12-30 DIAGNOSIS — E119 Type 2 diabetes mellitus without complications: Secondary | ICD-10-CM | POA: Diagnosis not present

## 2016-01-20 ENCOUNTER — Other Ambulatory Visit: Payer: Self-pay | Admitting: Radiology

## 2016-01-20 NOTE — Progress Notes (Unsigned)
Patient ID: Nicholas Wheeler, male   DOB: [birth year], 55 y.o.   MRN: [id number]  Pt scheduled for cerebral arteriogram in IR 5/19 am  Allergy list includes "IVP dye" Lists Hives as reaction  Spoke to pt on phone this am Asked him about said allergy He states he had hives many years ago with IVP dye Has since had CT scans with contrast without reaction  Discussed with Dr Estanislado Pandy Will not plan for any premedication

## 2016-01-21 ENCOUNTER — Other Ambulatory Visit (HOSPITAL_COMMUNITY): Payer: Self-pay | Admitting: Interventional Radiology

## 2016-01-21 ENCOUNTER — Ambulatory Visit (HOSPITAL_COMMUNITY)
Admission: RE | Admit: 2016-01-21 | Discharge: 2016-01-21 | Disposition: A | Discharge status: Home / Self Care | Payer: BLUE CROSS/BLUE SHIELD | Source: Ambulatory Visit | Attending: Interventional Radiology | Admitting: Interventional Radiology

## 2016-01-21 ENCOUNTER — Encounter (HOSPITAL_COMMUNITY): Payer: Self-pay

## 2016-01-21 DIAGNOSIS — Z881 Allergy status to other antibiotic agents status: Secondary | ICD-10-CM | POA: Insufficient documentation

## 2016-01-21 DIAGNOSIS — Z6838 Body mass index (BMI) 38.0-38.9, adult: Secondary | ICD-10-CM | POA: Insufficient documentation

## 2016-01-21 DIAGNOSIS — E119 Type 2 diabetes mellitus without complications: Secondary | ICD-10-CM | POA: Diagnosis not present

## 2016-01-21 DIAGNOSIS — Z87891 Personal history of nicotine dependence: Secondary | ICD-10-CM | POA: Insufficient documentation

## 2016-01-21 DIAGNOSIS — Z7982 Long term (current) use of aspirin: Secondary | ICD-10-CM | POA: Insufficient documentation

## 2016-01-21 DIAGNOSIS — I671 Cerebral aneurysm, nonruptured: Secondary | ICD-10-CM | POA: Insufficient documentation

## 2016-01-21 DIAGNOSIS — I729 Aneurysm of unspecified site: Secondary | ICD-10-CM

## 2016-01-21 DIAGNOSIS — E669 Obesity, unspecified: Secondary | ICD-10-CM | POA: Diagnosis not present

## 2016-01-21 DIAGNOSIS — Z91041 Radiographic dye allergy status: Secondary | ICD-10-CM | POA: Insufficient documentation

## 2016-01-21 DIAGNOSIS — G4710 Hypersomnia, unspecified: Secondary | ICD-10-CM | POA: Insufficient documentation

## 2016-01-21 DIAGNOSIS — Z79899 Other long term (current) drug therapy: Secondary | ICD-10-CM | POA: Diagnosis not present

## 2016-01-21 DIAGNOSIS — R51 Headache: Secondary | ICD-10-CM | POA: Diagnosis not present

## 2016-01-21 DIAGNOSIS — Z7984 Long term (current) use of oral hypoglycemic drugs: Secondary | ICD-10-CM | POA: Insufficient documentation

## 2016-01-21 DIAGNOSIS — Z85828 Personal history of other malignant neoplasm of skin: Secondary | ICD-10-CM | POA: Insufficient documentation

## 2016-01-21 DIAGNOSIS — G4730 Sleep apnea, unspecified: Secondary | ICD-10-CM | POA: Insufficient documentation

## 2016-01-21 DIAGNOSIS — Z888 Allergy status to other drugs, medicaments and biological substances status: Secondary | ICD-10-CM | POA: Insufficient documentation

## 2016-01-21 LAB — GLUCOSE, CAPILLARY
GLUCOSE-CAPILLARY: 128 mg/dL — AB (ref 65–99)
Glucose-Capillary: 173 mg/dL — ABNORMAL HIGH (ref 65–99)

## 2016-01-21 LAB — CBC
HCT: 43.2 % (ref 39.0–52.0)
HEMOGLOBIN: 14.6 g/dL (ref 13.0–17.0)
MCH: 28.4 pg (ref 26.0–34.0)
MCHC: 33.8 g/dL (ref 30.0–36.0)
MCV: 84 fL (ref 78.0–100.0)
Platelets: 233 10*3/uL (ref 150–400)
RBC: 5.14 MIL/uL (ref 4.22–5.81)
RDW: 13 % (ref 11.5–15.5)
WBC: 8.2 10*3/uL (ref 4.0–10.5)

## 2016-01-21 LAB — BASIC METABOLIC PANEL
ANION GAP: 9 (ref 5–15)
BUN: 11 mg/dL (ref 6–20)
CALCIUM: 8.8 mg/dL — AB (ref 8.9–10.3)
CO2: 26 mmol/L (ref 22–32)
Chloride: 102 mmol/L (ref 101–111)
Creatinine, Ser: 1.07 mg/dL (ref 0.61–1.24)
Glucose, Bld: 165 mg/dL — ABNORMAL HIGH (ref 65–99)
Potassium: 4.2 mmol/L (ref 3.5–5.1)
SODIUM: 137 mmol/L (ref 135–145)

## 2016-01-21 LAB — PROTIME-INR
INR: 1.01 (ref 0.00–1.49)
PROTHROMBIN TIME: 13.5 s (ref 11.6–15.2)

## 2016-01-21 LAB — APTT: APTT: 35 s (ref 24–37)

## 2016-01-21 MED ORDER — FENTANYL CITRATE (PF) 100 MCG/2ML IJ SOLN
INTRAMUSCULAR | Status: DC | PRN
  Administered 2016-01-21: 25 ug via INTRAVENOUS

## 2016-01-21 MED ORDER — IOPAMIDOL (ISOVUE-300) INJECTION 61%
INTRAVENOUS | Status: AC
  Administered 2016-01-21: 65 mL
  Filled 2016-01-21: qty 150

## 2016-01-21 MED ORDER — HEPARIN SODIUM (PORCINE) 1000 UNIT/ML IJ SOLN
INTRAMUSCULAR | Status: AC | PRN
  Administered 2016-01-21: 1000 [IU] via INTRAVENOUS

## 2016-01-21 MED ORDER — SODIUM CHLORIDE 0.9 % IV SOLN
Freq: Once | INTRAVENOUS | Status: AC
  Administered 2016-01-21: 09:00:00 via INTRAVENOUS

## 2016-01-21 MED ORDER — SODIUM CHLORIDE 0.9 % IV SOLN: INTRAVENOUS | Status: AC

## 2016-01-21 MED ORDER — FENTANYL CITRATE (PF) 100 MCG/2ML IJ SOLN
INTRAMUSCULAR | Status: AC
  Filled 2016-01-21: qty 2

## 2016-01-21 MED ORDER — HEPARIN SODIUM (PORCINE) 1000 UNIT/ML IJ SOLN
INTRAMUSCULAR | Status: AC
  Filled 2016-01-21: qty 1

## 2016-01-21 MED ORDER — MIDAZOLAM HCL 2 MG/2ML IJ SOLN
INTRAMUSCULAR | Status: AC | PRN
  Administered 2016-01-21: 1 mg via INTRAVENOUS

## 2016-01-21 MED ORDER — OXYCODONE-ACETAMINOPHEN 5-325 MG PO TABS
1.0000 | ORAL_TABLET | Freq: Four times a day (QID) | ORAL | Status: DC | PRN
  Administered 2016-01-21: 1 via ORAL

## 2016-01-21 MED ORDER — IOPAMIDOL (ISOVUE-300) INJECTION 61%
INTRAVENOUS | Status: AC
  Administered 2016-01-21: 25 mL
  Filled 2016-01-21: qty 50

## 2016-01-21 MED ORDER — FENTANYL CITRATE (PF) 100 MCG/2ML IJ SOLN
INTRAMUSCULAR | Status: AC | PRN
  Administered 2016-01-21 (×2): 25 ug via INTRAVENOUS

## 2016-01-21 MED ORDER — OXYCODONE-ACETAMINOPHEN 5-325 MG PO TABS
ORAL_TABLET | ORAL | Status: AC
  Filled 2016-01-21: qty 1

## 2016-01-21 MED ORDER — LIDOCAINE HCL 1 % IJ SOLN
INTRAMUSCULAR | Status: AC
  Administered 2016-01-21: 10 mL
  Filled 2016-01-21: qty 20

## 2016-01-21 MED ORDER — MIDAZOLAM HCL 2 MG/2ML IJ SOLN
INTRAMUSCULAR | Status: AC
  Filled 2016-01-21: qty 2

## 2016-01-21 NOTE — Procedures (Signed)
S/P 4 vessel cerebral arteriogram. Rt CFA approach  Findings. 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

## 2016-01-21 NOTE — Sedation Documentation (Signed)
5 Fr. Exoseal to right groin 

## 2016-01-21 NOTE — H&P (Signed)
Chief Complaint: headache with MCA aneurysm  Referring Physician: Andrey Spearman  Supervising Physician: Luanne Bras  Patient Status: Out-pt  HPI: Nicholas Wheeler is an 55 y.o. male who was referred to Dr. Estanislado Pandy secondary to chronic headaches as well as a recent MRA finding of a 67mm (R) MCA aneurysm.  The patient has been having a "constant" HA for the last 7-8 months he says.  He presents today for a cerebral angiogram for further evaluation.  No change in his history since he recently saw Dr. Estanislado Pandy.  Past Medical History:  Past Medical History  Diagnosis Date  . Diabetes ([place])   . Skin cancer   . Joint disorder     degenerated  . Migraine   . TMJ (temporomandibular joint disorder)   . Obesity (BMI 35.0-39.9 without comorbidity) ([place])   . Hypersomnia with sleep apnea, unspecified 09/08/2013  . Migraine   . Sleep apnea     saw Dr Brett Fairy in 09/2013    Past Surgical History:  Past Surgical History  Procedure Laterality Date  . Lumbar disc surgery    . Knee arthroscopy    . Carpal tunnel release    . Tonsillectomy    . Mole surgery      Family History:  Family History  Problem Relation Age of Onset  . Seizures Mother   . Schizophrenia Father   . Cancer Paternal Grandfather     Social History:  reports that he has quit smoking. He does not have any smokeless tobacco history on file. He reports that he drinks alcohol. He reports that he does not use illicit drugs.  Allergies:  Allergies  Allergen Reactions  . Glipizide Other (See Comments)    Makes heart feel funny  . Ivp Dye [Iodinated Diagnostic Agents] Hives    Dye used in the 80s   . Keflex [Cephalexin] Swelling    Throat swelling  . Verapamil Other (See Comments)    AV block    Medications:   Medication List    ASK your doctor about these medications        aspirin 81 MG tablet  Take 81 mg by mouth daily.     cetirizine 10 MG tablet  Commonly known as:  ZYRTEC  Take 10 mg by  mouth daily.     EQL NASAL SPRAY NO DRIP 0.05 % nasal spray  Generic drug:  oxymetazoline  Place 1-2 sprays into both nostrils daily as needed for congestion.     glucosamine-chondroitin 500-400 MG tablet  Take 1 tablet by mouth 2 (two) times daily.     loteprednol 0.5 % ophthalmic suspension  Commonly known as:  LOTEMAX  Place 1 drop into both eyes daily as needed (Chronic Red Eye).     metFORMIN 1000 MG tablet  Commonly known as:  GLUCOPHAGE  Take 1,000 mg by mouth 2 (two) times daily with a meal.     multivitamin with minerals tablet  Take 1 tablet by mouth daily.     propranolol 10 MG tablet  Commonly known as:  INDERAL  Take 10 mg by mouth 2 (two) times daily.     Saw Palmetto 160 MG Caps  Take 1 capsule by mouth 2 (two) times daily.        Please HPI for pertinent positives, otherwise complete 10 system ROS negative.  Mallampati Score: MD Evaluation Airway: WNL Heart: WNL Abdomen: WNL Chest/ Lungs: WNL ASA  Classification: 2 Mallampati/Airway Score: Two  Physical Exam: BP  129/85 mmHg  Pulse 77  Temp(Src) 98.9 F (37.2 C)  Resp 18  Ht 5\' 7"  (1.702 m)  Wt 247 lb (112.038 kg)  BMI 38.68 kg/m2  SpO2 98% Body mass index is 38.68 kg/(m^2). General: pleasant, obese white male who is laying in bed in NAD HEENT: head is normocephalic, atraumatic.  Sclera are noninjected.  PERRL.  Ears and nose without any masses or lesions.  Mouth is pink and moist Heart: regular, rate, and rhythm.  Normal s1,s2. No obvious murmurs, gallops, or rubs noted.  Palpable radial and pedal pulses bilaterally Lungs: CTAB, no wheezes, rhonchi, or rales noted.  Respiratory effort nonlabored Abd: soft, NT, ND, +BS, no masses, hernias, or organomegaly MS: all 4 extremities are symmetrical with no cyanosis, clubbing, or edema. Psych: A&Ox3 with an appropriate affect.   Labs: Results for orders placed or performed during the hospital encounter of 01/21/16 (from the past 48 hour(s))  CBC      Status: None   Collection Time: 01/21/16  9:00 AM  Result Value Ref Range   WBC 8.2 4.0 - 10.5 K/uL   RBC 5.14 4.22 - 5.81 MIL/uL   Hemoglobin 14.6 13.0 - 17.0 g/dL   HCT 43.2 39.0 - 52.0 %   MCV 84.0 78.0 - 100.0 fL   MCH 28.4 26.0 - 34.0 pg   MCHC 33.8 30.0 - 36.0 g/dL   RDW 13.0 11.5 - 15.5 %   Platelets 233 150 - 400 K/uL  Glucose, capillary     Status: Abnormal   Collection Time: 01/21/16  9:08 AM  Result Value Ref Range   Glucose-Capillary 173 (H) 65 - 99 mg/dL   Comment 1 Notify RN     Imaging: No results found.  Assessment/Plan 1. 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 -vitals have been reviewed.  Labs, except CBC, are currently still pending. -Risks and Benefits discussed with the patient including, but not limited to bleeding, infection, vascular injury or contrast induced renal failure. All of the patient's questions were answered, patient is agreeable to proceed. Consent signed and in chart.  Thank you for this interesting consult.  I greatly enjoyed meeting Nicholas Wheeler and look forward to participating in their care.  A copy of this report was sent to the requesting provider on this date.  Electronically Signed: Henreitta Cea 01/21/2016, 9:48 AM   I spent a total of    30 minutes in face to face in clinical consultation, greater than 50% of which was counseling/coordinating care for (R) MCA aneurysm

## 2016-01-21 NOTE — Sedation Documentation (Signed)
ETCO2 removed per Dr. Deveshwar 

## 2016-01-21 NOTE — Discharge Instructions (Signed)
Angiogram, Care After °Refer to this sheet in the next few weeks. These instructions provide you with information about caring for yourself after your procedure. Your health care provider may also give you more specific instructions. Your treatment has been planned according to current medical practices, but problems sometimes occur. Call your health care provider if you have any problems or questions after your procedure. °WHAT TO EXPECT AFTER THE PROCEDURE °After your procedure, it is typical to have the following: °· Bruising at the catheter insertion site that usually fades within 1-2 weeks. °· Blood collecting in the tissue (hematoma) that may be painful to the touch. It should usually decrease in size and tenderness within 1-2 weeks. °HOME CARE INSTRUCTIONS °· Take medicines only as directed by your health care provider. °· You may shower 24-48 hours after the procedure or as directed by your health care provider. Remove the bandage (dressing) and gently wash the site with plain soap and water. Pat the area dry with a clean towel. Do not rub the site, because this may cause bleeding. °· Do not take baths, swim, or use a hot tub until your health care provider approves. °· Check your insertion site every day for redness, swelling, or drainage. °· Do not apply powder or lotion to the site. °· Do not lift over 10 lb (4.5 kg) for 5 days after your procedure or as directed by your health care provider. °· Ask your health care provider when it is okay to: °¨ Return to work or school. °¨ Resume usual physical activities or sports. °¨ Resume sexual activity. °· Do not drive home if you are discharged the same day as the procedure. Have someone else drive you. °· You may drive 24 hours after the procedure unless otherwise instructed by your health care provider. °· Do not operate machinery or power tools for 24 hours after the procedure or as directed by your health care provider. °· If your procedure was done as an  outpatient procedure, which means that you went home the same day as your procedure, a responsible adult should be with you for the first 24 hours after you arrive home. °· Keep all follow-up visits as directed by your health care provider. This is important. °SEEK MEDICAL CARE IF: °· You have a fever. °· You have chills. °· You have increased bleeding from the catheter insertion site. Hold pressure on the site and call 911. °SEEK IMMEDIATE MEDICAL CARE IF: °· You have unusual pain at the catheter insertion site. °· You have redness, warmth, or swelling at the catheter insertion site. °· You have drainage (other than a small amount of blood on the dressing) from the catheter insertion site. °· The catheter insertion site is bleeding, and the bleeding does not stop after 30 minutes of holding steady pressure on the site. °· The area near or just beyond the catheter insertion site becomes pale, cool, tingly, or numb. °  °This information is not intended to replace advice given to you by your health care provider. Make sure you discuss any questions you have with your health care provider. °  °Document Released: 03/09/2005 Document Revised: 09/11/2014 Document Reviewed: 01/22/2013 °Elsevier Interactive Patient Education ©2016 Elsevier Inc. ° °

## 2016-01-26 ENCOUNTER — Other Ambulatory Visit (HOSPITAL_COMMUNITY): Payer: Self-pay | Admitting: Interventional Radiology

## 2016-01-26 DIAGNOSIS — I671 Cerebral aneurysm, nonruptured: Secondary | ICD-10-CM

## 2016-02-09 ENCOUNTER — Encounter (HOSPITAL_COMMUNITY)
Admission: RE | Admit: 2016-02-09 | Discharge: 2016-02-09 | Disposition: A | Discharge status: Home / Self Care | Payer: BLUE CROSS/BLUE SHIELD | Source: Ambulatory Visit | Attending: Interventional Radiology | Admitting: Interventional Radiology

## 2016-02-09 ENCOUNTER — Other Ambulatory Visit: Payer: Self-pay | Admitting: Radiology

## 2016-02-09 ENCOUNTER — Encounter (HOSPITAL_COMMUNITY): Payer: Self-pay

## 2016-02-09 DIAGNOSIS — Z7902 Long term (current) use of antithrombotics/antiplatelets: Secondary | ICD-10-CM | POA: Insufficient documentation

## 2016-02-09 DIAGNOSIS — E119 Type 2 diabetes mellitus without complications: Secondary | ICD-10-CM | POA: Insufficient documentation

## 2016-02-09 DIAGNOSIS — Z87891 Personal history of nicotine dependence: Secondary | ICD-10-CM | POA: Diagnosis not present

## 2016-02-09 DIAGNOSIS — Z79899 Other long term (current) drug therapy: Secondary | ICD-10-CM | POA: Diagnosis not present

## 2016-02-09 DIAGNOSIS — Z7984 Long term (current) use of oral hypoglycemic drugs: Secondary | ICD-10-CM | POA: Diagnosis not present

## 2016-02-09 DIAGNOSIS — I729 Aneurysm of unspecified site: Secondary | ICD-10-CM | POA: Insufficient documentation

## 2016-02-09 DIAGNOSIS — Z01812 Encounter for preprocedural laboratory examination: Secondary | ICD-10-CM | POA: Diagnosis not present

## 2016-02-09 DIAGNOSIS — K219 Gastro-esophageal reflux disease without esophagitis: Secondary | ICD-10-CM | POA: Diagnosis not present

## 2016-02-09 DIAGNOSIS — Z01818 Encounter for other preprocedural examination: Secondary | ICD-10-CM | POA: Insufficient documentation

## 2016-02-09 DIAGNOSIS — Z7982 Long term (current) use of aspirin: Secondary | ICD-10-CM | POA: Insufficient documentation

## 2016-02-09 HISTORY — DX: Type 2 diabetes mellitus with diabetic neuropathy, unspecified: E11.40

## 2016-02-09 HISTORY — DX: Aneurysm of unspecified site: I72.9

## 2016-02-09 HISTORY — DX: Presence of spectacles and contact lenses: Z97.3

## 2016-02-09 HISTORY — DX: Gastro-esophageal reflux disease without esophagitis: K21.9

## 2016-02-09 LAB — CBC WITH DIFFERENTIAL/PLATELET
BASOS PCT: 0 %
Basophils Absolute: 0 10*3/uL (ref 0.0–0.1)
EOS ABS: 0.3 10*3/uL (ref 0.0–0.7)
EOS PCT: 3 %
HCT: 44.7 % (ref 39.0–52.0)
Hemoglobin: 14.7 g/dL (ref 13.0–17.0)
LYMPHS ABS: 2 10*3/uL (ref 0.7–4.0)
Lymphocytes Relative: 20 %
MCH: 28.3 pg (ref 26.0–34.0)
MCHC: 32.9 g/dL (ref 30.0–36.0)
MCV: 86 fL (ref 78.0–100.0)
Monocytes Absolute: 0.8 10*3/uL (ref 0.1–1.0)
Monocytes Relative: 8 %
Neutro Abs: 6.8 10*3/uL (ref 1.7–7.7)
Neutrophils Relative %: 69 %
PLATELETS: 252 10*3/uL (ref 150–400)
RBC: 5.2 MIL/uL (ref 4.22–5.81)
RDW: 13.1 % (ref 11.5–15.5)
WBC: 10 10*3/uL (ref 4.0–10.5)

## 2016-02-09 LAB — PROTIME-INR
INR: 0.96 (ref 0.00–1.49)
PROTHROMBIN TIME: 13 s (ref 11.6–15.2)

## 2016-02-09 LAB — COMPREHENSIVE METABOLIC PANEL
ALK PHOS: 51 U/L (ref 38–126)
ALT: 19 U/L (ref 17–63)
AST: 24 U/L (ref 15–41)
Albumin: 3.9 g/dL (ref 3.5–5.0)
Anion gap: 8 (ref 5–15)
BILIRUBIN TOTAL: 0.6 mg/dL (ref 0.3–1.2)
BUN: 9 mg/dL (ref 6–20)
CALCIUM: 9.1 mg/dL (ref 8.9–10.3)
CO2: 25 mmol/L (ref 22–32)
CREATININE: 1 mg/dL (ref 0.61–1.24)
Chloride: 104 mmol/L (ref 101–111)
Glucose, Bld: 150 mg/dL — ABNORMAL HIGH (ref 65–99)
Potassium: 4.2 mmol/L (ref 3.5–5.1)
SODIUM: 137 mmol/L (ref 135–145)
TOTAL PROTEIN: 6.9 g/dL (ref 6.5–8.1)

## 2016-02-09 LAB — APTT: aPTT: 35 seconds (ref 24–37)

## 2016-02-09 LAB — GLUCOSE, CAPILLARY: Glucose-Capillary: 142 mg/dL — ABNORMAL HIGH (ref 65–99)

## 2016-02-09 NOTE — Progress Notes (Signed)
Pt denies SOB, chest pain, and being under the care of a cardiologist. Pt denies having a cardiac cath. Pt denies having a chest x ray within the last year but stated that an EKG was done this year at [facility]; records requested. Pt stated that his fasting blood sugar ranges from 120-180. Delsa Sale, RN provided pt with pre-op medication instructions and a prescription that needs to be started 13 hours  prior to procedure. Pt stated that he will start his Plavix this morning and that he generally takes Aspirin in the A.M. already. Pt advised to take both medications together in the morning ( per Delsa Sale, Therapist, sports).  Pt chart forwarded to anesthesia as requested for consult.

## 2016-02-09 NOTE — Pre-Procedure Instructions (Signed)
DEREKE BINION  02/09/2016      [id number] [place] [place] [address] Phone: [phone] Fax: [phone]  [id number] [place], [place] [address] Phone: [phone] Fax: [phone]    Your procedure is scheduled on Wednesday, February 16, 2016  Report to [facility] Admitting at 6:30 A.M.  Call this number if you have problems the morning of surgery:  [phone]   Remember: Please be sure to follow doctors instructions regarding pre-medication with  Prednisone and Benadryl starting 13 hours prior to procedure; call Anderson Malta at [phone] with any questions.   Do not eat food or drink liquids after midnight Tuesday, February 15, 2016  Take these medicines the morning of surgery with A SIP OF WATER : Aspirin, Plavix, propranolol (INDERAL), cetirizine (ZYRTEC), if needed: oxymetazoline  nasal spray for congestion  Stop taking vitamins, fish oil and herbal medications such as Saw Palmetto and Glucosamine-Chondroitin . Do not take any NSAIDs ie: Ibuprofen, Advil, Naproxen, BC and Goody Powder; stop now.   How to Manage Your Diabetes Before and After Surgery  Why is it important to control my blood sugar before and after surgery? . Improving blood sugar levels before and after surgery helps healing and can limit problems. . A way of improving blood sugar control is eating a healthy diet by: o  Eating less sugar and carbohydrates o  Increasing activity/exercise o  Talking with your doctor about reaching your blood sugar goals . High blood sugars (greater than 180 mg/dL) can raise your risk of infections and slow your recovery, so you will need to focus on controlling your diabetes during the weeks before surgery. . Make sure that the doctor who takes care of your diabetes knows about your planned surgery including the date and location.  How  do I manage my blood sugar before surgery? . Check your blood sugar at least 4 times a day, starting 2 days before surgery, to make sure that the level is not too high or low. o Check your blood sugar the morning of your surgery when you wake up and every 2 hours until you get to the Short Stay unit. . If your blood sugar is less than 70 mg/dL, you will need to treat for low blood sugar: o Do not take insulin. o Treat a low blood sugar (less than 70 mg/dL) with  cup of clear juice (cranberry or apple), 4 glucose tablets, OR glucose gel. o Recheck blood sugar in 15 minutes after treatment (to make sure it is greater than 70 mg/dL). If your blood sugar is not greater than 70 mg/dL on recheck, call [phone] for further instructions. . Report your blood sugar to the short stay nurse when you get to Short Stay.  . If you are admitted to the hospital after surgery: o Your blood sugar will be checked by the staff and you will probably be given insulin after surgery (instead of oral diabetes medicines) to make sure you have good blood sugar levels. o The goal for blood sugar control after surgery is 80-180 mg/dL.  WHAT DO I DO ABOUT MY DIABETES MEDICATION?   Marland Kitchen Do not take oral diabetes medicines (pills) the morning of surgery such as metFORMIN (GLUCOPHAGE)  Patient Signature:  Date:   Nurse Signature:  Date:   Reviewed and Endorsed by [facility] Patient Education Committee, August 2015  Do not wear jewelry, make-up or nail polish.  Do not wear lotions, powders, or perfumes.  You may not wear deodorant.  Do not shave 48 hours prior to surgery.  Men may shave face and neck.  Do not bring valuables to the hospital.  [facility] is not responsible for any belongings or valuables.  Contacts, dentures or bridgework may not be worn into surgery.  Leave your suitcase in the car.  After surgery it may be brought to your room.  For patients admitted to the hospital, discharge time will be  determined by your treatment team.  Patients discharged the day of surgery will not be allowed to drive home.   Name and phone number of your driver:   Special instructions: Shower the night before surgery and the morning of surgery with CHG.  Please read over the following fact sheets that you were given. Pain Booklet, Coughing and Deep Breathing and Surgical Site Infection Prevention

## 2016-02-10 ENCOUNTER — Encounter (HOSPITAL_COMMUNITY): Payer: Self-pay | Admitting: Vascular Surgery

## 2016-02-10 LAB — HEMOGLOBIN A1C
Hgb A1c MFr Bld: 6.8 % — ABNORMAL HIGH (ref 4.8–5.6)
Mean Plasma Glucose: 148 mg/dL

## 2016-02-10 NOTE — Progress Notes (Addendum)
Anesthesia Chart Review:  Pt is a 55 year old male scheduled for aneurysm embolization with Dr. Estanislado Pandy on 02/16/2016.   PCP is Dr. Micheline Rough.   PMH includes:  DM, OSA, GERD. Former smoker. BMI 40  Medications include: ASA, plavix, metformin, propranolol  Preoperative labs reviewed.  HgbA1c 6.8, glucose 150  EKG received from PCP's office does not have a date on it. EKG will be obtained DOS.   If EKG acceptable DOS, I anticipate pt can proceed with surgery as scheduled.   Willeen Cass, FNP-BC [facility] Short Stay Surgical Center/Anesthesiology Phone: [phone] 02/11/2016 4:25 PM

## 2016-02-14 ENCOUNTER — Other Ambulatory Visit: Payer: Self-pay | Admitting: Radiology

## 2016-02-14 LAB — PLATELET INHIBITION P2Y12: Platelet Function  P2Y12: 2 [PRU] — ABNORMAL LOW (ref 194–418)

## 2016-02-15 ENCOUNTER — Other Ambulatory Visit: Payer: Self-pay | Admitting: Physician Assistant

## 2016-02-16 ENCOUNTER — Ambulatory Visit (HOSPITAL_COMMUNITY)
Admission: RE | Admit: 2016-02-16 | Payer: BLUE CROSS/BLUE SHIELD | Source: Ambulatory Visit | Admitting: Interventional Radiology

## 2016-02-16 ENCOUNTER — Ambulatory Visit (HOSPITAL_COMMUNITY): Admission: RE | Admit: 2016-02-16 | Payer: BLUE CROSS/BLUE SHIELD | Source: Ambulatory Visit

## 2016-02-16 ENCOUNTER — Encounter (HOSPITAL_COMMUNITY): Admission: RE | Payer: Self-pay | Source: Ambulatory Visit

## 2016-02-16 SURGERY — RADIOLOGY WITH ANESTHESIA
Anesthesia: General

## 2016-02-21 ENCOUNTER — Other Ambulatory Visit: Payer: Self-pay | Admitting: Radiology

## 2016-02-21 LAB — PLATELET INHIBITION P2Y12: PLATELET FUNCTION P2Y12: 0 [PRU] — AB (ref 194–418)

## 2016-02-25 ENCOUNTER — Telehealth (HOSPITAL_COMMUNITY): Payer: Self-pay | Admitting: *Deleted

## 2016-02-25 NOTE — Telephone Encounter (Signed)
Called pt with instructions from Dr. Denzil Hughes 45mg  once a day and ASA 81mg  daily repeat P2Y12 in 5 days.  Wife voiced understanding of instructions and repeated back

## 2016-03-01 ENCOUNTER — Other Ambulatory Visit: Payer: Self-pay | Admitting: Physician Assistant

## 2016-03-01 LAB — PLATELET INHIBITION P2Y12: Platelet Function  P2Y12: 6 [PRU] — ABNORMAL LOW (ref 194–418)

## 2016-03-02 ENCOUNTER — Telehealth (HOSPITAL_COMMUNITY): Payer: Self-pay | Admitting: *Deleted

## 2016-03-02 NOTE — Telephone Encounter (Signed)
Left message for patient to return call.  Dr. Estanislado Pandy wants to make medication change r/t lab work.

## 2016-03-06 NOTE — Telephone Encounter (Signed)
Cpt return called instructed to decrease Brilinta to !/2 tab every other day and continue ASA.  He will come back next week for repeat P2Y12.

## 2016-03-15 ENCOUNTER — Other Ambulatory Visit: Payer: Self-pay | Admitting: Radiology

## 2016-03-15 LAB — PLATELET INHIBITION P2Y12: Platelet Function  P2Y12: 185 [PRU] — ABNORMAL LOW (ref 194–418)

## 2016-03-20 ENCOUNTER — Telehealth (HOSPITAL_COMMUNITY): Payer: Self-pay | Admitting: *Deleted

## 2016-03-20 NOTE — Telephone Encounter (Signed)
Called and spoke with patients wife.  Per Dr. Estanislado Pandy pt to conitnue current dose of Brilinta !/2 tab twice a day and ASA 81mg  once a day.  Anderson Malta will call to get patient scheduled

## 2016-04-04 DIAGNOSIS — H1032 Unspecified acute conjunctivitis, left eye: Secondary | ICD-10-CM | POA: Diagnosis not present

## 2016-04-04 DIAGNOSIS — J0100 Acute maxillary sinusitis, unspecified: Secondary | ICD-10-CM | POA: Diagnosis not present

## 2016-04-11 ENCOUNTER — Other Ambulatory Visit: Payer: Self-pay | Admitting: Radiology

## 2016-04-14 ENCOUNTER — Other Ambulatory Visit: Payer: Self-pay | Admitting: Radiology

## 2016-04-14 LAB — PLATELET INHIBITION P2Y12: PLATELET FUNCTION P2Y12: 190 [PRU] — AB (ref 194–418)

## 2016-04-19 ENCOUNTER — Telehealth (HOSPITAL_COMMUNITY): Payer: Self-pay | Admitting: *Deleted

## 2016-04-19 ENCOUNTER — Encounter (HOSPITAL_COMMUNITY): Payer: Self-pay

## 2016-04-19 ENCOUNTER — Other Ambulatory Visit: Payer: Self-pay | Admitting: Radiology

## 2016-04-19 ENCOUNTER — Encounter (HOSPITAL_COMMUNITY)
Admission: RE | Admit: 2016-04-19 | Discharge: 2016-04-19 | Disposition: A | Discharge status: Home / Self Care | Payer: BLUE CROSS/BLUE SHIELD | Source: Ambulatory Visit | Attending: Interventional Radiology | Admitting: Interventional Radiology

## 2016-04-19 DIAGNOSIS — I671 Cerebral aneurysm, nonruptured: Secondary | ICD-10-CM | POA: Diagnosis not present

## 2016-04-19 DIAGNOSIS — Z87891 Personal history of nicotine dependence: Secondary | ICD-10-CM | POA: Insufficient documentation

## 2016-04-19 DIAGNOSIS — Z79899 Other long term (current) drug therapy: Secondary | ICD-10-CM | POA: Insufficient documentation

## 2016-04-19 DIAGNOSIS — E1140 Type 2 diabetes mellitus with diabetic neuropathy, unspecified: Secondary | ICD-10-CM | POA: Diagnosis not present

## 2016-04-19 DIAGNOSIS — Z7982 Long term (current) use of aspirin: Secondary | ICD-10-CM | POA: Diagnosis not present

## 2016-04-19 DIAGNOSIS — G4710 Hypersomnia, unspecified: Secondary | ICD-10-CM | POA: Insufficient documentation

## 2016-04-19 DIAGNOSIS — Z7902 Long term (current) use of antithrombotics/antiplatelets: Secondary | ICD-10-CM | POA: Insufficient documentation

## 2016-04-19 DIAGNOSIS — Z7984 Long term (current) use of oral hypoglycemic drugs: Secondary | ICD-10-CM | POA: Diagnosis not present

## 2016-04-19 DIAGNOSIS — Z01818 Encounter for other preprocedural examination: Secondary | ICD-10-CM | POA: Insufficient documentation

## 2016-04-19 DIAGNOSIS — Z01812 Encounter for preprocedural laboratory examination: Secondary | ICD-10-CM | POA: Insufficient documentation

## 2016-04-19 DIAGNOSIS — G4733 Obstructive sleep apnea (adult) (pediatric): Secondary | ICD-10-CM | POA: Insufficient documentation

## 2016-04-19 HISTORY — DX: Chronic sinusitis, unspecified: J32.9

## 2016-04-19 LAB — APTT: aPTT: 36 seconds (ref 24–36)

## 2016-04-19 LAB — CBC WITH DIFFERENTIAL/PLATELET
Basophils Absolute: 0 10*3/uL (ref 0.0–0.1)
Basophils Relative: 0 %
Eosinophils Absolute: 0.3 10*3/uL (ref 0.0–0.7)
Eosinophils Relative: 3 %
HEMATOCRIT: 45.2 % (ref 39.0–52.0)
HEMOGLOBIN: 14.9 g/dL (ref 13.0–17.0)
LYMPHS ABS: 2.7 10*3/uL (ref 0.7–4.0)
Lymphocytes Relative: 28 %
MCH: 28.5 pg (ref 26.0–34.0)
MCHC: 33 g/dL (ref 30.0–36.0)
MCV: 86.4 fL (ref 78.0–100.0)
MONOS PCT: 6 %
Monocytes Absolute: 0.6 10*3/uL (ref 0.1–1.0)
NEUTROS ABS: 6.2 10*3/uL (ref 1.7–7.7)
NEUTROS PCT: 63 %
Platelets: 257 10*3/uL (ref 150–400)
RBC: 5.23 MIL/uL (ref 4.22–5.81)
RDW: 12.8 % (ref 11.5–15.5)
WBC: 9.8 10*3/uL (ref 4.0–10.5)

## 2016-04-19 LAB — PLATELET INHIBITION P2Y12: Platelet Function  P2Y12: 195 [PRU] (ref 194–418)

## 2016-04-19 LAB — COMPREHENSIVE METABOLIC PANEL
ALBUMIN: 3.9 g/dL (ref 3.5–5.0)
ALK PHOS: 52 U/L (ref 38–126)
ALT: 20 U/L (ref 17–63)
ANION GAP: 8 (ref 5–15)
AST: 19 U/L (ref 15–41)
BILIRUBIN TOTAL: 0.7 mg/dL (ref 0.3–1.2)
BUN: 10 mg/dL (ref 6–20)
CALCIUM: 9 mg/dL (ref 8.9–10.3)
CO2: 22 mmol/L (ref 22–32)
CREATININE: 0.95 mg/dL (ref 0.61–1.24)
Chloride: 106 mmol/L (ref 101–111)
GFR calc non Af Amer: >60 mL/min (ref >60)
GLUCOSE: 126 mg/dL — AB (ref 65–99)
Potassium: 4.4 mmol/L (ref 3.5–5.1)
Sodium: 136 mmol/L (ref 135–145)
TOTAL PROTEIN: 6.7 g/dL (ref 6.5–8.1)

## 2016-04-19 LAB — PROTIME-INR
INR: 0.98
Prothrombin Time: 13 seconds (ref 11.4–15.2)

## 2016-04-19 LAB — GLUCOSE, CAPILLARY: Glucose-Capillary: 140 mg/dL — ABNORMAL HIGH (ref 65–99)

## 2016-04-19 NOTE — Progress Notes (Signed)
Pt denies SOB, chest pain, and being under the care of a cardiologist. Pt denies having a cardiac cath. Pt denies having a chest x ray within the last year but stated that an EKG was done this year at [facility]; records requested. Pt stated that his fasting blood glucose ranges from 120-180. During pt PAT visit on 02/09/16, Anderson Malta, RN, provided pt with pre-op medication instructions and prescriptions for Benadryl and Prednisone. Today, pt stated that he will get the prescriptions filled and verbalized understanding of pre-med instructions. Pt stated that he will start medication 13 hours prior to procedure.  Pt chart forwarded to anesthesia  for consult.

## 2016-04-19 NOTE — Pre-Procedure Instructions (Signed)
Nicholas Wheeler  04/19/2016      [id number] [place] [place] [address] Phone: [phone] Fax: [phone]  [id number] [place], [place] [address] Phone: [phone] Fax: [phone]    Your procedure is scheduled on Monday, April 24, 2016  Report to [facility] Admitting at 6:00 A.M. ( per MD)  Call this number if you have problems the morning of surgery:  [phone]   Remember:  Do not eat food or drink liquids after midnight Sunday, April 23, 2016  Take these medicines the morning of surgery with A SIP OF WATER : Aspirin, Zyrtec, Propranolol ( Inderal) Ticagrelor (Brilinta),  Polytrim eye drops, if needed: nasal spray, Lotemax eye drops  Stop taking vitamins, fish oil and herbal medications such as Saw Palmetto and Glucosamine-Chondroitin. Do not take any NSAIDs ie: Ibuprofen, Advil, Naproxen, BC and Goody powder; stop now.    How to Manage Your Diabetes Before and After Surgery  Why is it important to control my blood sugar before and after surgery? . Improving blood sugar levels before and after surgery helps healing and can limit problems. . A way of improving blood sugar control is eating a healthy diet by: o  Eating less sugar and carbohydrates o  Increasing activity/exercise o  Talking with your doctor about reaching your blood sugar goals . High blood sugars (greater than 180 mg/dL) can raise your risk of infections and slow your recovery, so you will need to focus on controlling your diabetes during the weeks before surgery. . Make sure that the doctor who takes care of your diabetes knows about your planned surgery including the date and location.  How do I manage my blood sugar before surgery? . Check your blood sugar at least 4 times a day, starting 2 days before surgery, to make sure that the level is not too  high or low. o Check your blood sugar the morning of your surgery when you wake up and every 2 hours until you get to the Short Stay unit. . If your blood sugar is less than 70 mg/dL, you will need to treat for low blood sugar: o Do not take insulin. o Treat a low blood sugar (less than 70 mg/dL) with  cup of clear juice (cranberry or apple), 4 glucose tablets, OR glucose gel. o Recheck blood sugar in 15 minutes after treatment (to make sure it is greater than 70 mg/dL). If your blood sugar is not greater than 70 mg/dL on recheck, call [phone] for further instructions. . Report your blood sugar to the short stay nurse when you get to Short Stay.  . If you are admitted to the hospital after surgery: o Your blood sugar will be checked by the staff and you will probably be given insulin after surgery (instead of oral diabetes medicines) to make sure you have good blood sugar levels. o The goal for blood sugar control after surgery is 80-180 mg/dL.  WHAT DO I DO ABOUT MY DIABETES MEDICATION?   Marland Kitchen Do not take oral diabetes medicines (pills) the morning of surgery such as Metformin ( Glucophage)  Patient Signature:  Date:   Nurse Signature:  Date:   Reviewed and Endorsed by [facility] Patient Education Committee, August 2015  Do not wear jewelry, make-up or nail polish.  Do not wear lotions, powders, or perfumes.  You may  not wear deoderant.  Do not shave 48 hours prior to surgery.  Men may shave face and neck.  Do not bring valuables to the hospital.  [facility] is not responsible for any belongings or valuables.  Contacts, dentures or bridgework may not be worn into surgery.  Leave your suitcase in the car.  After surgery it may be brought to your room.  For patients admitted to the hospital, discharge time will be determined by your treatment team.  Patients discharged the day of surgery will not be allowed to drive home.   Name and phone number of your driver:   Special  instructions: Shower the night before surgery and the morning of surgery with CHG.  Please read over the following fact sheets that you were given. Pain Booklet, Coughing and Deep Breathing and Surgical Site Infection Prevention

## 2016-04-19 NOTE — Telephone Encounter (Signed)
Called and left message for pt related to medication.  He is to take Brilinta 90mg  in am and 45mg  in pm and to continue taking ASA 81mg  daily.  Left return number for pt to call if he has questions

## 2016-04-20 ENCOUNTER — Telehealth (HOSPITAL_COMMUNITY): Payer: Self-pay | Admitting: *Deleted

## 2016-04-20 LAB — HEMOGLOBIN A1C
Hgb A1c MFr Bld: 7.9 % — ABNORMAL HIGH (ref 4.8–5.6)
MEAN PLASMA GLUCOSE: 180 mg/dL

## 2016-04-20 NOTE — Progress Notes (Signed)
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 Estanislado Pandy. It appears procedure was initially scheduled for 02/16/16, but was postponed due to decreased p2y12, and he was changed from Plavix to [place].  History includes former smoker, DM2 with diabetic neuropathy, migraines, TMJ, hypersomnia with OSA, GERD, tonsillectomy. BMI is consistent with morbid obesity.   PCP is listed as Dr. Micheline Rough with [facility].  Meds include ASA 81mg , Zyrtec, metformin, propranolol, saw palmetto, Brilinta. He will be getting pre-op medications (Benadryl, Prednisone) due to his contrast allergy.   BP (!) 145/84   Pulse 73   Temp 36.7 C   Resp 20   Ht 5\' 7"  (1.702 m)   Wt 259 lb 11.2 oz (117.8 kg)   SpO2 97%   BMI 40.67 kg/m   06/08/15 EKG ([place]): NSR. Isolated Q wave in III (insignificant in aVF). Baseline wanderer in I, II.  01/21/16 Bilateral CCA and innominate angiography and bilateral vertebral artery angiograms: IMPRESSION: Approximately 4.1 x 3.7 mm bilobed irregular saccular aneurysm arising at the junction of the anterior temporal branch with the main trunk of the right middle cerebral artery.  Preoperative labs note. A1c 7.9.  P2y12 195. (By 04/19/16 notation by Frederico Hamman, RN, "He is to take Brilinta 90mg  in am and 45mg  in pm and to continue taking ASA 81mg  daily.") I spoke with Anderson Malta in IR. Patient will need a repeat p2y12 on the day of surgery.   If p2y12 result felt acceptable and otherwise no acute changes then I would anticipate that he could proceed as planned.  George Hugh [facility] Short Stay Center/Anesthesiology Phone [phone] 04/20/2016 11:19 AM

## 2016-04-20 NOTE — Telephone Encounter (Signed)
Called and clarified with patient that he now after discussion with Dr. Estanislado Pandy is to take Brilinta 1/2 tab twice a day and to continute the ASA 81mg  daily. He voiced understanding

## 2016-04-23 MED ORDER — ASPIRIN EC 325 MG PO TBEC
325.0000 mg | DELAYED_RELEASE_TABLET | Freq: Once | ORAL | Status: AC
  Administered 2016-04-24: 325 mg via ORAL
  Filled 2016-04-23 (×2): qty 1

## 2016-04-23 MED ORDER — VANCOMYCIN HCL 1000 MG IV SOLR
1000.0000 mg | INTRAVENOUS | Status: AC
  Filled 2016-04-23: qty 1000

## 2016-04-23 MED ORDER — SODIUM CHLORIDE 0.9 % IV SOLN: Freq: Once | INTRAVENOUS | Status: DC

## 2016-04-24 ENCOUNTER — Encounter (HOSPITAL_COMMUNITY): Payer: Self-pay | Admitting: Certified Registered Nurse Anesthetist

## 2016-04-24 ENCOUNTER — Ambulatory Visit (HOSPITAL_COMMUNITY): Payer: BLUE CROSS/BLUE SHIELD | Admitting: Anesthesiology

## 2016-04-24 ENCOUNTER — Encounter (HOSPITAL_COMMUNITY)
Admission: AD | Disposition: A | Discharge status: Home / Self Care | Payer: Self-pay | Source: Ambulatory Visit | Attending: Interventional Radiology

## 2016-04-24 ENCOUNTER — Encounter (HOSPITAL_COMMUNITY): Payer: Self-pay

## 2016-04-24 ENCOUNTER — Inpatient Hospital Stay (HOSPITAL_COMMUNITY)
Admission: AD | Admit: 2016-04-24 | Discharge: 2016-04-25 | DRG: 026 | Disposition: A | Discharge status: Home / Self Care | Payer: BLUE CROSS/BLUE SHIELD | Source: Ambulatory Visit | Attending: Interventional Radiology | Admitting: Interventional Radiology

## 2016-04-24 ENCOUNTER — Ambulatory Visit (HOSPITAL_COMMUNITY)
Admission: RE | Admit: 2016-04-24 | Discharge: 2016-04-24 | Disposition: A | Discharge status: Home / Self Care | Payer: BLUE CROSS/BLUE SHIELD | Source: Ambulatory Visit | Attending: Interventional Radiology | Admitting: Interventional Radiology

## 2016-04-24 ENCOUNTER — Ambulatory Visit (HOSPITAL_COMMUNITY): Payer: BLUE CROSS/BLUE SHIELD | Admitting: Vascular Surgery

## 2016-04-24 DIAGNOSIS — G43909 Migraine, unspecified, not intractable, without status migrainosus: Secondary | ICD-10-CM | POA: Diagnosis not present

## 2016-04-24 DIAGNOSIS — Z79899 Other long term (current) drug therapy: Secondary | ICD-10-CM | POA: Diagnosis not present

## 2016-04-24 DIAGNOSIS — Z7902 Long term (current) use of antithrombotics/antiplatelets: Secondary | ICD-10-CM

## 2016-04-24 DIAGNOSIS — Z7984 Long term (current) use of oral hypoglycemic drugs: Secondary | ICD-10-CM

## 2016-04-24 DIAGNOSIS — Z6841 Body Mass Index (BMI) 40.0 and over, adult: Secondary | ICD-10-CM | POA: Diagnosis not present

## 2016-04-24 DIAGNOSIS — E1140 Type 2 diabetes mellitus with diabetic neuropathy, unspecified: Secondary | ICD-10-CM | POA: Diagnosis present

## 2016-04-24 DIAGNOSIS — Z87891 Personal history of nicotine dependence: Secondary | ICD-10-CM | POA: Diagnosis not present

## 2016-04-24 DIAGNOSIS — E669 Obesity, unspecified: Secondary | ICD-10-CM | POA: Diagnosis present

## 2016-04-24 DIAGNOSIS — G4730 Sleep apnea, unspecified: Secondary | ICD-10-CM | POA: Diagnosis not present

## 2016-04-24 DIAGNOSIS — K219 Gastro-esophageal reflux disease without esophagitis: Secondary | ICD-10-CM | POA: Diagnosis not present

## 2016-04-24 DIAGNOSIS — I671 Cerebral aneurysm, nonruptured: Secondary | ICD-10-CM | POA: Diagnosis present

## 2016-04-24 DIAGNOSIS — Z7982 Long term (current) use of aspirin: Secondary | ICD-10-CM | POA: Diagnosis not present

## 2016-04-24 DIAGNOSIS — Z85828 Personal history of other malignant neoplasm of skin: Secondary | ICD-10-CM

## 2016-04-24 HISTORY — PX: RADIOLOGY WITH ANESTHESIA: SHX6223

## 2016-04-24 HISTORY — PX: IR GENERIC HISTORICAL: IMG1180011

## 2016-04-24 LAB — BASIC METABOLIC PANEL
Anion gap: 11 (ref 5–15)
BUN: 11 mg/dL (ref 6–20)
CHLORIDE: 102 mmol/L (ref 101–111)
CO2: 23 mmol/L (ref 22–32)
CREATININE: 0.99 mg/dL (ref 0.61–1.24)
Calcium: 9.5 mg/dL (ref 8.9–10.3)
GFR calc non Af Amer: >60 mL/min (ref >60)
Glucose, Bld: 227 mg/dL — ABNORMAL HIGH (ref 65–99)
Potassium: 4.7 mmol/L (ref 3.5–5.1)
Sodium: 136 mmol/L (ref 135–145)

## 2016-04-24 LAB — GLUCOSE, CAPILLARY
GLUCOSE-CAPILLARY: 232 mg/dL — AB (ref 65–99)
GLUCOSE-CAPILLARY: 160 mg/dL — AB (ref 65–99)
GLUCOSE-CAPILLARY: 153 mg/dL — AB (ref 65–99)
Glucose-Capillary: 199 mg/dL — ABNORMAL HIGH (ref 65–99)

## 2016-04-24 LAB — HEPARIN LEVEL (UNFRACTIONATED): Heparin Unfractionated: <0.1 IU/mL — ABNORMAL LOW (ref 0.30–0.70)

## 2016-04-24 LAB — POCT ACTIVATED CLOTTING TIME
ACTIVATED CLOTTING TIME: 164 s
Activated Clotting Time: 153 seconds
Activated Clotting Time: 158 seconds

## 2016-04-24 LAB — PLATELET INHIBITION P2Y12: Platelet Function  P2Y12: 46 [PRU] — ABNORMAL LOW (ref 194–418)

## 2016-04-24 LAB — MRSA PCR SCREENING: MRSA BY PCR: NEGATIVE

## 2016-04-24 SURGERY — RADIOLOGY WITH ANESTHESIA
Anesthesia: General

## 2016-04-24 MED ORDER — IOPAMIDOL (ISOVUE-300) INJECTION 61%
INTRAVENOUS | Status: AC
  Administered 2016-04-24: 75 mL
  Filled 2016-04-24: qty 150

## 2016-04-24 MED ORDER — POLYMYXIN B-TRIMETHOPRIM 10000-0.1 UNIT/ML-% OP SOLN
1.0000 [drp] | Freq: Every day | OPHTHALMIC | Status: DC
  Filled 2016-04-24: qty 10

## 2016-04-24 MED ORDER — ACETAMINOPHEN 650 MG RE SUPP: 650.0000 mg | Freq: Four times a day (QID) | RECTAL | Status: DC | PRN

## 2016-04-24 MED ORDER — ACETAMINOPHEN 500 MG PO TABS
1000.0000 mg | ORAL_TABLET | Freq: Four times a day (QID) | ORAL | Status: DC | PRN
  Administered 2016-04-24 (×2): 1000 mg via ORAL
  Filled 2016-04-24 (×2): qty 2

## 2016-04-24 MED ORDER — MIDAZOLAM HCL 2 MG/2ML IJ SOLN
INTRAMUSCULAR | Status: DC | PRN
  Administered 2016-04-24: 2 mg via INTRAVENOUS

## 2016-04-24 MED ORDER — ESMOLOL HCL 100 MG/10ML IV SOLN: INTRAVENOUS | Status: DC | PRN

## 2016-04-24 MED ORDER — PHENYLEPHRINE HCL 10 MG/ML IJ SOLN
INTRAMUSCULAR | Status: DC | PRN
  Administered 2016-04-24: 120 ug via INTRAVENOUS
  Administered 2016-04-24 (×2): 80 ug via INTRAVENOUS

## 2016-04-24 MED ORDER — FENTANYL CITRATE (PF) 100 MCG/2ML IJ SOLN
INTRAMUSCULAR | Status: DC | PRN
  Administered 2016-04-24 (×2): 50 ug via INTRAVENOUS
  Administered 2016-04-24: 100 ug via INTRAVENOUS

## 2016-04-24 MED ORDER — NICARDIPINE HCL IN NACL 20-0.86 MG/200ML-% IV SOLN
3.0000 mg/h | INTRAVENOUS | Status: DC
  Filled 2016-04-24: qty 200

## 2016-04-24 MED ORDER — LIDOCAINE HCL (CARDIAC) 20 MG/ML IV SOLN
INTRAVENOUS | Status: DC | PRN
  Administered 2016-04-24: 80 mg via INTRAVENOUS

## 2016-04-24 MED ORDER — NITROGLYCERIN 1 MG/10 ML FOR IR/CATH LAB
INTRA_ARTERIAL | Status: AC
  Filled 2016-04-24: qty 10

## 2016-04-24 MED ORDER — LACTATED RINGERS IV SOLN
INTRAVENOUS | Status: DC | PRN
  Administered 2016-04-24 (×2): via INTRAVENOUS

## 2016-04-24 MED ORDER — ONDANSETRON HCL 4 MG/2ML IJ SOLN: 4.0000 mg | Freq: Once | INTRAMUSCULAR | Status: DC | PRN

## 2016-04-24 MED ORDER — PROPRANOLOL HCL 10 MG PO TABS
10.0000 mg | ORAL_TABLET | Freq: Two times a day (BID) | ORAL | Status: DC
  Administered 2016-04-25: 10 mg via ORAL
  Filled 2016-04-24 (×2): qty 1

## 2016-04-24 MED ORDER — LABETALOL HCL 5 MG/ML IV SOLN
INTRAVENOUS | Status: DC | PRN
  Administered 2016-04-24 (×4): 5 mg via INTRAVENOUS

## 2016-04-24 MED ORDER — HEPARIN (PORCINE) IN NACL 100-0.45 UNIT/ML-% IJ SOLN
700.0000 [IU]/h | INTRAMUSCULAR | Status: AC
  Administered 2016-04-25: 700 [IU]/h via INTRAVENOUS
  Filled 2016-04-24: qty 250

## 2016-04-24 MED ORDER — HEPARIN SODIUM (PORCINE) 1000 UNIT/ML IJ SOLN
INTRAMUSCULAR | Status: DC | PRN
  Administered 2016-04-24: 500 [IU] via INTRAVENOUS
  Administered 2016-04-24: 3000 [IU] via INTRAVENOUS
  Administered 2016-04-24 (×2): 500 [IU] via INTRAVENOUS

## 2016-04-24 MED ORDER — PHENYLEPHRINE HCL 10 MG/ML IJ SOLN
INTRAVENOUS | Status: DC | PRN
  Administered 2016-04-24: 5 ug/min via INTRAVENOUS

## 2016-04-24 MED ORDER — LOTEPREDNOL ETABONATE 0.5 % OP SUSP
1.0000 [drp] | Freq: Every day | OPHTHALMIC | Status: DC | PRN
  Filled 2016-04-24: qty 5

## 2016-04-24 MED ORDER — ONDANSETRON HCL 4 MG/2ML IJ SOLN: 4.0000 mg | Freq: Four times a day (QID) | INTRAMUSCULAR | Status: DC | PRN

## 2016-04-24 MED ORDER — ROCURONIUM BROMIDE 100 MG/10ML IV SOLN
INTRAVENOUS | Status: DC | PRN
  Administered 2016-04-24: 10 mg via INTRAVENOUS
  Administered 2016-04-24: 30 mg via INTRAVENOUS
  Administered 2016-04-24: 50 mg via INTRAVENOUS

## 2016-04-24 MED ORDER — SODIUM CHLORIDE 0.9 % IV SOLN
INTRAVENOUS | Status: DC
  Administered 2016-04-24 (×2): via INTRAVENOUS

## 2016-04-24 MED ORDER — PROPOFOL 10 MG/ML IV BOLUS
INTRAVENOUS | Status: DC | PRN
  Administered 2016-04-24: 150 mg via INTRAVENOUS
  Administered 2016-04-24: 40 mg via INTRAVENOUS
  Administered 2016-04-24: 50 mg via INTRAVENOUS

## 2016-04-24 MED ORDER — OXYMETAZOLINE HCL 0.05 % NA SOLN
1.0000 | Freq: Every day | NASAL | Status: DC | PRN
  Filled 2016-04-24: qty 15

## 2016-04-24 MED ORDER — INSULIN ASPART 100 UNIT/ML ~~LOC~~ SOLN
2.0000 [IU] | SUBCUTANEOUS | Status: DC
  Administered 2016-04-24 (×2): 4 [IU] via SUBCUTANEOUS
  Administered 2016-04-25: 2 [IU] via SUBCUTANEOUS
  Administered 2016-04-25: 4 [IU] via SUBCUTANEOUS
  Administered 2016-04-25: 2 [IU] via SUBCUTANEOUS

## 2016-04-24 MED ORDER — LORATADINE 10 MG PO TABS
10.0000 mg | ORAL_TABLET | Freq: Every day | ORAL | Status: DC
  Administered 2016-04-25: 10 mg via ORAL
  Filled 2016-04-24: qty 1

## 2016-04-24 MED ORDER — CEFAZOLIN SODIUM-DEXTROSE 2-4 GM/100ML-% IV SOLN
INTRAVENOUS | Status: AC
  Filled 2016-04-24: qty 100

## 2016-04-24 MED ORDER — SUGAMMADEX SODIUM 200 MG/2ML IV SOLN
INTRAVENOUS | Status: DC | PRN
  Administered 2016-04-24: 200 mg via INTRAVENOUS

## 2016-04-24 MED ORDER — IOPAMIDOL (ISOVUE-300) INJECTION 61%
INTRAVENOUS | Status: AC
  Administered 2016-04-24: 15 mL
  Filled 2016-04-24: qty 50

## 2016-04-24 MED ORDER — ONDANSETRON HCL 4 MG/2ML IJ SOLN
INTRAMUSCULAR | Status: DC | PRN
Start: 2016-04-24 — End: 2016-04-24
  Administered 2016-04-24: 4 mg via INTRAVENOUS

## 2016-04-24 MED ORDER — ESMOLOL HCL 100 MG/10ML IV SOLN
INTRAVENOUS | Status: DC | PRN
  Administered 2016-04-24: 70 mg via INTRAVENOUS
  Administered 2016-04-24: 30 mg via INTRAVENOUS

## 2016-04-24 MED ORDER — TICAGRELOR 90 MG PO TABS
45.0000 mg | ORAL_TABLET | Freq: Two times a day (BID) | ORAL | Status: DC
Start: 2016-04-24 — End: 2016-04-25
  Administered 2016-04-24 – 2016-04-25 (×2): 45 mg via ORAL
  Filled 2016-04-24 (×2): qty 1

## 2016-04-24 MED ORDER — NICARDIPINE HCL IN NACL 20-0.86 MG/200ML-% IV SOLN
5.0000 mg/h | INTRAVENOUS | Status: DC
  Administered 2016-04-24 (×2): 5 mg/h via INTRAVENOUS
  Administered 2016-04-25: 2.5 mg/h via INTRAVENOUS
  Filled 2016-04-24 (×3): qty 200

## 2016-04-24 MED ORDER — IOPAMIDOL (ISOVUE-300) INJECTION 61%
INTRAVENOUS | Status: AC
  Filled 2016-04-24: qty 100

## 2016-04-24 MED ORDER — CEFAZOLIN SODIUM-DEXTROSE 2-3 GM-% IV SOLR
INTRAVENOUS | Status: DC | PRN
  Administered 2016-04-24: 2 g via INTRAVENOUS

## 2016-04-24 MED ORDER — FENTANYL CITRATE (PF) 100 MCG/2ML IJ SOLN: 25.0000 ug | INTRAMUSCULAR | Status: DC | PRN

## 2016-04-24 MED ORDER — FENTANYL CITRATE (PF) 100 MCG/2ML IJ SOLN
25.0000 ug | Freq: Four times a day (QID) | INTRAMUSCULAR | Status: DC | PRN
  Administered 2016-04-24 – 2016-04-25 (×4): 25 ug via INTRAVENOUS
  Filled 2016-04-24 (×4): qty 2

## 2016-04-24 MED ORDER — HEPARIN (PORCINE) IN NACL 100-0.45 UNIT/ML-% IJ SOLN
700.0000 [IU]/h | INTRAMUSCULAR | Status: DC
  Administered 2016-04-24 (×2): 500 [IU]/h via INTRAVENOUS
  Filled 2016-04-24: qty 250

## 2016-04-24 MED ORDER — ASPIRIN 325 MG PO TABS
325.0000 mg | ORAL_TABLET | Freq: Every day | ORAL | Status: DC
  Administered 2016-04-25: 325 mg via ORAL
  Filled 2016-04-24: qty 1

## 2016-04-24 MED ORDER — HEPARIN (PORCINE) IN NACL 100-0.45 UNIT/ML-% IJ SOLN
INTRAMUSCULAR | Status: AC
  Filled 2016-04-24: qty 250

## 2016-04-24 MED ORDER — SAW PALMETTO 160 MG PO CAPS: 160.0000 mg | ORAL_CAPSULE | Freq: Two times a day (BID) | ORAL | Status: DC

## 2016-04-24 MED ORDER — ONDANSETRON HCL 4 MG/2ML IJ SOLN: INTRAMUSCULAR | Status: DC | PRN

## 2016-04-24 NOTE — Procedures (Signed)
S/P RT common  Carotid  arteriogram followed by staged embolization of RT MCA aneurysm with an LVIS JR stent device

## 2016-04-24 NOTE — Progress Notes (Signed)
ANTICOAGULATION CONSULT NOTE - Follow Up Consult  Pharmacy Consult for heparin Indication: embolization of R MCA aneurysm  Allergies  Allergen Reactions  . Keflex [Cephalexin] Anaphylaxis and Swelling    Throat swelling  . Plavix [Clopidogrel] Other (See Comments)    " bleeding and chest burning"  . Ivp Dye [Iodinated Diagnostic Agents] Hives    Dye used in the 80s   . Verapamil Other (See Comments)    AV block  . Glipizide Other (See Comments)    Makes heart feel funny    Patient Measurements: Height: 5\' 7"  (170.2 cm) Weight: 259 lb 11.2 oz (117.8 kg) IBW/kg (Calculated) : 66.1 Heparin Dosing Weight: 93kg  Vital Signs: Temp: 98.3 F (36.8 C) (08/21 1600) Temp Source: Axillary (08/21 1600) BP: 146/73 (08/21 2100) Pulse Rate: 85 (08/21 2100)  Labs:  Recent Labs  04/24/16 0615 04/24/16 2014  [place]  --  <0.10*  CREATININE 0.99  --     Estimated Creatinine Clearance: 102.3 mL/min (by C-G formula based on SCr of 0.99 mg/dL).   Assessment: Nicholas Wheeler s/p embolization of R MCA aneurysm, on IV heparin 500 units/hr, heparin level undetectable. Original order to stop heparin tomorrow at noon.   Goal of Therapy:  Heparin level 0.1-0.25 units/ml Monitor platelets by anticoagulation protocol: Yes   Plan:  - increase heparin rate to 700 units/hr - f/u AM heparin level  Maryanna Shape, PharmD, BCPS  Clinical Pharmacist  Pager: [phone]   04/24/2016,9:26 PM

## 2016-04-24 NOTE — H&P (Deleted)
  The note originally documented on this encounter has been moved the the encounter in which it belongs.  

## 2016-04-24 NOTE — Progress Notes (Signed)
Anesthesia-Kelsey, CRNA present for case

## 2016-04-24 NOTE — Transfer of Care (Signed)
Immediate Anesthesia Transfer of Care Note  Patient: Nicholas Wheeler  Procedure(s) Performed: Procedure(s): EMBOLIZATION (N/A)  Patient Location: PACU  Anesthesia Type:General  Level of Consciousness: awake, alert , oriented and patient cooperative  Airway & Oxygen Therapy: Patient Spontanous Breathing and Patient connected to face mask oxygen  Post-op Assessment: Report given to RN and Post -op Vital signs reviewed and stable  Post vital signs: Reviewed and stable  Last Vitals:  Vitals:   04/24/16 0759  BP: (!) 143/98  Pulse: 89  Resp: 20  Temp: 36.6 C    Last Pain:  Vitals:   04/24/16 0759  TempSrc: Oral  PainSc: 3       Patients Stated Pain Goal: 1 ([id number])  Complications: No apparent anesthesia complications

## 2016-04-24 NOTE — Anesthesia Preprocedure Evaluation (Signed)
Anesthesia Evaluation  Patient identified by MRN, date of birth, ID band Patient awake    Reviewed: Allergy & Precautions, H&P , NPO status , Patient's Chart, lab work & pertinent test results  History of Anesthesia Complications Negative for: history of anesthetic complications  Airway Mallampati: II  TM Distance: >3 FB Neck ROM: full    Dental no notable dental hx.    Pulmonary sleep apnea , former smoker,    Pulmonary exam normal breath sounds clear to auscultation       Cardiovascular + Peripheral Vascular Disease  Normal cardiovascular exam Rhythm:regular Rate:Normal     Neuro/Psych  Headaches,    GI/Hepatic Neg liver ROS, GERD  ,  Endo/Other  diabetesMorbid obesity  Renal/GU negative Renal ROS     Musculoskeletal   Abdominal   Peds  Hematology negative hematology ROS (+)   Anesthesia Other Findings   Reproductive/Obstetrics negative OB ROS                             Anesthesia Physical Anesthesia Plan  ASA: III  Anesthesia Plan: General   Post-op Pain Management:    Induction: Intravenous  Airway Management Planned: Oral ETT  Additional Equipment: Arterial line  Intra-op Plan:   Post-operative Plan: Possible Post-op intubation/ventilation  Informed Consent: I have reviewed the patients History and Physical, chart, labs and discussed the procedure including the risks, benefits and alternatives for the proposed anesthesia with the patient or authorized representative who has indicated his/her understanding and acceptance.   Dental Advisory Given  Plan Discussed with: Anesthesiologist, CRNA and Surgeon  Anesthesia Plan Comments:         Anesthesia Quick Evaluation

## 2016-04-24 NOTE — Anesthesia Procedure Notes (Signed)
Procedure Name: Intubation Date/Time: 04/24/2016 9:05 AM Performed by: Salli Quarry Nizhoni Parlow Pre-anesthesia Checklist: Patient identified, Emergency Drugs available, Suction available and Patient being monitored Patient Re-evaluated:Patient Re-evaluated prior to inductionOxygen Delivery Method: Circle System Utilized Preoxygenation: Pre-oxygenation with 100% oxygen Intubation Type: IV induction Ventilation: Two handed mask ventilation required and Mask ventilation with difficulty Laryngoscope Size: McGraph and 4 Grade View: Grade II Tube type: Oral Tube size: 7.5 mm Number of attempts: 1 Airway Equipment and Method: Stylet Placement Confirmation: ETT inserted through vocal cords under direct vision,  positive ETCO2 and breath sounds checked- equal and bilateral Secured at: 23 cm Tube secured with: Tape Dental Injury: Teeth and Oropharynx as per pre-operative assessment

## 2016-04-24 NOTE — Anesthesia Postprocedure Evaluation (Signed)
Anesthesia Post Note  Patient: Nicholas Wheeler  Procedure(s) Performed: Procedure(s) (LRB): EMBOLIZATION (N/A)  Patient location during evaluation: PACU Anesthesia Type: General Level of consciousness: awake and alert Pain management: pain level controlled Vital Signs Assessment: post-procedure vital signs reviewed and stable Respiratory status: spontaneous breathing, nonlabored ventilation, respiratory function stable and patient connected to nasal cannula oxygen Cardiovascular status: blood pressure returned to baseline and stable Postop Assessment: no signs of nausea or vomiting Anesthetic complications: no    Last Vitals:  Vitals:   04/24/16 0759 04/24/16 1200  BP: (!) 143/98 113/73  Pulse: 89 93  Resp: 20 19  Temp: 36.6 C 36.6 C    Last Pain:  Vitals:   04/24/16 1200  TempSrc:   PainSc: 0-No pain          RLE Sensation: No tingling;No pain;No numbness (04/24/16 1200)      Zenaida Deed

## 2016-04-24 NOTE — Progress Notes (Signed)
Referring Physician(s): Dr Redmond School  Supervising Physician: Luanne Bras  Patient Status:  Inpatient  Chief Complaint:  R middle cerebral artery aneurysm  Subjective:  S/P RT common  Carotid  arteriogram followed by staged embolization of RT MCA aneurysm with an LVIS JR stent device  Doing well Denies N/V +headache Denies visual or speech issues Drinking broth  Allergies: Keflex [cephalexin]; Plavix [clopidogrel]; Ivp dye [iodinated diagnostic agents]; Verapamil; and Glipizide  Medications: Prior to Admission medications   Medication Sig Start Date End Date Taking? Authorizing Provider  aspirin 81 MG tablet Take 81 mg by mouth daily.   Yes Historical Provider, MD  cetirizine (ZYRTEC) 10 MG tablet Take 10 mg by mouth daily.   Yes Historical Provider, MD  loteprednol (LOTEMAX) 0.5 % ophthalmic suspension Place 1 drop into both eyes daily as needed (Chronic Red Eye).   Yes Historical Provider, MD  metFORMIN (GLUCOPHAGE) 1000 MG tablet Take 1,000 mg by mouth 2 (two) times daily with a meal.   Yes Historical Provider, MD  oxymetazoline (EQL NASAL SPRAY NO DRIP) 0.05 % nasal spray Place 1-2 sprays into both nostrils daily as needed for congestion.   Yes Historical Provider, MD  propranolol (INDERAL) 10 MG tablet Take 10 mg by mouth 2 (two) times daily. 05/18/15  Yes Historical Provider, MD  ticagrelor (BRILINTA) 90 MG TABS tablet Take 45 mg by mouth every other day.    Yes Luanne Bras, MD  trimethoprim-polymyxin b (POLYTRIM) ophthalmic solution Place 1 drop into the left eye 6 (six) times daily. 04/04/16  Yes Historical Provider, MD  glucosamine-chondroitin 500-400 MG tablet Take 1 tablet by mouth 2 (two) times daily.    Historical Provider, MD  Multiple Vitamins-Minerals (MULTIVITAMIN WITH MINERALS) tablet Take 1 tablet by mouth daily.    Historical Provider, MD  Saw Palmetto 160 MG CAPS Take 160 mg by mouth 2 (two) times daily.     Historical Provider, MD      Vital Signs: BP 103/67   Pulse 82   Temp 98.1 F (36.7 C) (Axillary)   Resp 19   Ht 5\' 7"  (1.702 m)   Wt 259 lb 11.2 oz (117.8 kg)   SpO2 97%   BMI 40.67 kg/m   Physical Exam  Constitutional: He is oriented to person, place, and time. He appears well-nourished.  HENT:  Face symmetrical Tongue midline   Abdominal: Soft. Bowel sounds are normal.  Musculoskeletal: Normal range of motion.  Good strength and sensation B  Neurological: He is alert and oriented to person, place, and time.  Skin: Skin is warm and dry.  Rt groin NT No bleeding No hematoma Rt foot 2+ pulses  Psychiatric: He has a normal mood and affect. His behavior is normal. Judgment and thought content normal.  Nursing note and vitals reviewed.   Imaging: No results found.  Labs:  CBC:  Recent Labs  01/21/16 0900 02/09/16 0954 04/19/16 1306  WBC 8.2 10.0 9.8  HGB 14.6 14.7 14.9  HCT 43.2 44.7 45.2  PLT 233 252 257    COAGS:  Recent Labs  01/21/16 0900 02/09/16 0954 04/19/16 1306  INR 1.01 0.96 0.98  APTT 35 35 36    BMP:  Recent Labs  01/21/16 0900 02/09/16 0954 04/19/16 1306 04/24/16 0615  NA 137 137 136 136  K 4.2 4.2 4.4 4.7  CL 102 104 106 102  CO2 26 25 22 23   GLUCOSE 165* 150* 126* 227*  BUN 11 9 10 11   CALCIUM  8.8* 9.1 9.0 9.5  CREATININE 1.07 1.00 0.95 0.99  GFRNONAA >60 >60 >60 >60  GFRAA >60 >60 >60 >60    LIVER FUNCTION TESTS:  Recent Labs  02/09/16 0954 04/19/16 1306  BILITOT 0.6 0.7  AST 24 19  ALT 19 20  ALKPHOS 51 52  PROT 6.9 6.7  ALBUMIN 3.9 3.9    Assessment and Plan:  R MCA aneurysm embolization  Doing well Dr Estanislado Pandy has seen and examined pt Will recheck in am---plan for possible dc in am  Electronically Signed: Mitchell Epling A 04/24/2016, 3:48 PM   I spent a total of 15 Minutes at the the patient's bedside AND on the patient's hospital floor or unit, greater than 50% of which was counseling/coordinating care for R MCA  aneurysm embolization

## 2016-04-24 NOTE — H&P (Signed)
Chief Complaint: Patient was seen in consultation today for right middle cerebral artery aneurysm embolization at the request of Dr Redmond School  Referring Physician(s): Dr Redmond School  Supervising Physician: Luanne Bras  Patient Status: Outpatient  History of Present Illness: Nicholas Wheeler is a 55 y.o. male   Hx Migraine headaches Work up revealed R MCA aneurysm 01/21/2016 Cerebral arteriogram: IMPRESSION: Approximately 4.1 x 3.7 mm bilobed irregular saccular aneurysm arising at the junction of the anterior temporal branch with the main trunk of the right middle cerebral artery.  Referred to Dr Estanislado Pandy for evaluation and possible treatment Now scheduled for cerebral arteriogram with possible embolization of Right meddle cerebral artery aneurysm.  Past Medical History:  Diagnosis Date  . Aneurysm ([place])   . Diabetes ([place])   . Diabetic neuropathy ([place])   . GERD (gastroesophageal reflux disease)   . Hypersomnia with sleep apnea, unspecified 09/08/2013  . Joint disorder    degenerated  . Migraine   . Migraine   . Obesity (BMI 35.0-39.9 without comorbidity) ([place])   . Recurrent sinus infections   . Skin cancer   . Sleep apnea    saw Dr Brett Fairy in 09/2013; wears CPAP set at 14  . TMJ (temporomandibular joint disorder)   . Wears glasses     Past Surgical History:  Procedure Laterality Date  . CARPAL TUNNEL RELEASE    . COLONOSCOPY W/ BIOPSIES AND POLYPECTOMY    . KNEE ARTHROSCOPY    . LUMBAR DISC SURGERY    . mole surgery    . TONSILLECTOMY      Allergies: Keflex [cephalexin]; Plavix [clopidogrel]; Ivp dye [iodinated diagnostic agents]; Verapamil; and Glipizide  Medications: Prior to Admission medications   Medication Sig Start Date End Date Taking? Authorizing Provider  aspirin 81 MG tablet Take 81 mg by mouth daily.   Yes Historical Provider, MD  cetirizine (ZYRTEC) 10 MG tablet Take 10 mg by mouth daily.   Yes Historical Provider, MD    glucosamine-chondroitin 500-400 MG tablet Take 1 tablet by mouth 2 (two) times daily.   Yes Historical Provider, MD  loteprednol (LOTEMAX) 0.5 % ophthalmic suspension Place 1 drop into both eyes daily as needed (Chronic Red Eye).   Yes Historical Provider, MD  metFORMIN (GLUCOPHAGE) 1000 MG tablet Take 1,000 mg by mouth 2 (two) times daily with a meal.   Yes Historical Provider, MD  Multiple Vitamins-Minerals (MULTIVITAMIN WITH MINERALS) tablet Take 1 tablet by mouth daily.   Yes Historical Provider, MD  oxymetazoline (EQL NASAL SPRAY NO DRIP) 0.05 % nasal spray Place 1-2 sprays into both nostrils daily as needed for congestion.   Yes Historical Provider, MD  propranolol (INDERAL) 10 MG tablet Take 10 mg by mouth 2 (two) times daily. 05/18/15  Yes Historical Provider, MD  Saw Palmetto 160 MG CAPS Take 160 mg by mouth 2 (two) times daily.    Yes Historical Provider, MD  ticagrelor (BRILINTA) 90 MG TABS tablet Take 45 mg by mouth every other day.    Yes Luanne Bras, MD  trimethoprim-polymyxin b (POLYTRIM) ophthalmic solution Place 1 drop into the left eye 6 (six) times daily. 04/04/16  Yes Historical Provider, MD     Family History  Problem Relation Age of Onset  . Seizures Mother   . Schizophrenia Father   . Cancer Paternal Grandfather     Social History   Social History  . Marital status: Married    Spouse name: elizabeth  . Number of children: 2  .  Years of education: [name]   Occupational History  . [employer]    Social History Main Topics  . Smoking status: Former Research scientist (life sciences)  . Smokeless tobacco: Never Used     Comment: quit smoking 30 years ago 02/09/16  . Alcohol use Yes     Comment: occasional  . Drug use: No  . Sexual activity: Not Asked   Other Topics Concern  . None   Social History Narrative   Lives at home with wife    Caffeine use- coffee, 5 cups daily     Review of Systems: A 12 point ROS discussed and pertinent positives are indicated in the HPI above.  All other  systems are negative.  Review of Systems  Constitutional: Negative for activity change, appetite change, fatigue and fever.  HENT: Negative for tinnitus and trouble swallowing.   Eyes: Negative for visual disturbance.  Respiratory: Negative for wheezing.   Cardiovascular: Negative for chest pain.  Gastrointestinal: Negative for abdominal pain.  Musculoskeletal: Negative for gait problem.  Neurological: Positive for headaches. Negative for dizziness, tremors, seizures, syncope, speech difficulty, weakness, light-headedness and numbness.  Psychiatric/Behavioral: Negative for behavioral problems and confusion.    Vital Signs: There were no vitals taken for this visit.  Physical Exam  Constitutional: He is oriented to person, place, and time. He appears well-developed and well-nourished.  HENT:  Head: Atraumatic.  Eyes: EOM are normal.  Neck: Neck supple.  Cardiovascular: Normal rate, regular rhythm and normal heart sounds.   No murmur heard. Pulmonary/Chest: Effort normal and breath sounds normal. He has no wheezes.  Abdominal: Soft. Bowel sounds are normal. There is no tenderness.  Musculoskeletal: Normal range of motion. He exhibits no edema.  Neurological: He is alert and oriented to person, place, and time.  Skin: Skin is warm and dry.  Psychiatric: He has a normal mood and affect. His behavior is normal. Judgment and thought content normal.  Nursing note and vitals reviewed.   Mallampati Score:  MD Evaluation Airway: WNL Heart: WNL Abdomen: WNL Chest/ Lungs: WNL ASA  Classification: 3 Mallampati/Airway Score: One  Imaging: No results found.  Labs:  CBC:  Recent Labs  01/21/16 0900 02/09/16 0954 04/19/16 1306  WBC 8.2 10.0 9.8  HGB 14.6 14.7 14.9  HCT 43.2 44.7 45.2  PLT 233 252 257    COAGS:  Recent Labs  01/21/16 0900 02/09/16 0954 04/19/16 1306  INR 1.01 0.96 0.98  APTT 35 35 36    BMP:  Recent Labs  01/21/16 0900 02/09/16 0954  04/19/16 1306 04/24/16 0615  NA 137 137 136 136  K 4.2 4.2 4.4 4.7  CL 102 104 106 102  CO2 26 25 22 23   GLUCOSE 165* 150* 126* 227*  BUN 11 9 10 11   CALCIUM 8.8* 9.1 9.0 9.5  CREATININE 1.07 1.00 0.95 0.99  GFRNONAA >60 >60 >60 >60  GFRAA >60 >60 >60 >60    LIVER FUNCTION TESTS:  Recent Labs  02/09/16 0954 04/19/16 1306  BILITOT 0.6 0.7  AST 24 19  ALT 19 20  ALKPHOS 51 52  PROT 6.9 6.7  ALBUMIN 3.9 3.9    TUMOR MARKERS: No results for input(s): AFPTM, CEA, CA199, CHROMGRNA in the last 8760 hours.  Assessment and Plan:  Hx Migraine headaches Right middle cerebral artery aneurysm Scheduled today for embolization in IR with Dr Estanislado Pandy Risks and Benefits discussed with the patient including, but not limited to bleeding, infection, vascular injury, contrast induced renal failure, stroke or even death. All  of the patient's questions were answered, patient is agreeable to proceed. Consent signed and in chart.  Pt aware if intervention performed he will be admitted to Neuro ICU overnight and planned for discharge following day. He and family are aware and agreeable  Thank you for this interesting consult.  I greatly enjoyed meeting LUCIANO SHANNAHAN and look forward to participating in their care.  A copy of this report was sent to the requesting provider on this date.  Electronically Signed: Monia Sabal A 04/24/2016, 8:14 AM   I spent a total of  30 Minutes   in face to face in clinical consultation, greater than 50% of which was counseling/coordinating care for R MCA aneurysm embolization

## 2016-04-25 ENCOUNTER — Encounter (HOSPITAL_COMMUNITY): Payer: Self-pay | Admitting: Interventional Radiology

## 2016-04-25 LAB — GLUCOSE, CAPILLARY
GLUCOSE-CAPILLARY: 141 mg/dL — AB (ref 65–99)
GLUCOSE-CAPILLARY: 179 mg/dL — AB (ref 65–99)
GLUCOSE-CAPILLARY: 189 mg/dL — AB (ref 65–99)
Glucose-Capillary: 137 mg/dL — ABNORMAL HIGH (ref 65–99)

## 2016-04-25 LAB — CBC WITH DIFFERENTIAL/PLATELET
BASOS PCT: 0 %
Basophils Absolute: 0 10*3/uL (ref 0.0–0.1)
EOS ABS: 0.1 10*3/uL (ref 0.0–0.7)
Eosinophils Relative: 1 %
HCT: 41 % (ref 39.0–52.0)
Hemoglobin: 13.5 g/dL (ref 13.0–17.0)
Lymphocytes Relative: 18 %
Lymphs Abs: 2.3 10*3/uL (ref 0.7–4.0)
MCH: 28.4 pg (ref 26.0–34.0)
MCHC: 32.9 g/dL (ref 30.0–36.0)
MCV: 86.3 fL (ref 78.0–100.0)
MONO ABS: 0.9 10*3/uL (ref 0.1–1.0)
MONOS PCT: 7 %
NEUTROS PCT: 74 %
Neutro Abs: 9.4 10*3/uL — ABNORMAL HIGH (ref 1.7–7.7)
Platelets: 258 10*3/uL (ref 150–400)
RBC: 4.75 MIL/uL (ref 4.22–5.81)
RDW: 13.3 % (ref 11.5–15.5)
WBC: 12.7 10*3/uL — ABNORMAL HIGH (ref 4.0–10.5)

## 2016-04-25 LAB — BASIC METABOLIC PANEL
Anion gap: 7 (ref 5–15)
BUN: 8 mg/dL (ref 6–20)
CALCIUM: 8.2 mg/dL — AB (ref 8.9–10.3)
CO2: 26 mmol/L (ref 22–32)
CREATININE: 0.87 mg/dL (ref 0.61–1.24)
Chloride: 107 mmol/L (ref 101–111)
Glucose, Bld: 136 mg/dL — ABNORMAL HIGH (ref 65–99)
Potassium: 3.7 mmol/L (ref 3.5–5.1)
Sodium: 140 mmol/L (ref 135–145)

## 2016-04-25 LAB — PLATELET INHIBITION P2Y12: PLATELET FUNCTION P2Y12: 166 [PRU] — AB (ref 194–418)

## 2016-04-25 NOTE — Discharge Summary (Signed)
Patient ID: Nicholas Wheeler MRN: [id number] DOB/AGE: [birth year] 55 y.o.  Admit date: 04/24/2016 Discharge date: 04/25/2016  Supervising Physician: Luanne Bras  Admission Diagnoses: Right middle cerebral artery aneurysm  Discharge Diagnoses:  Active Problems:   Brain aneurysm   Discharged Condition: stable  Hospital Course: Pt was being worked up for migraine headaches when R MCA aneurysm was discovered. Referred to Dr Estanislado Pandy and has undergone cerebral arteriogram with Right middle cerebral artery aneurysm embolization with LVIS JR stent device 8/21. Pt has tolerated procedure well. Only complaint is continued migraine - but does respond to Fentanyl po. Eating well; slept well Denies N/V. Passing gas Denies visual or speech issues. Dr Estanislado Pandy has seen and evaluated pt. Plan for discharge home today.    Consults: None  Significant Diagnostic Studies: Cerebral arteriogram  Treatments:  CEREBRAL ANGIOGRAM [id number] (Custom)]       S/P RT common  Carotid  arteriogram followed by staged embolization of RT MCA aneurysm with an LVIS JR stent device        Discharge Exam: Blood pressure 128/63, pulse 79, temperature 97.9 F (36.6 C), temperature source Oral, resp. rate 15, height 5\' 7"  (1.702 m), weight 259 lb 11.2 oz (117.8 kg), SpO2 96 %.  A/O Appropriate Pleasant Complains of ongoing migraine headache Fentanyl helps  Face symmetrical; tongue midline Smile = Puffs cheeks =  Heart: RRR Lungs: CTA Abd: +BS; NT no masses Extr: FROM; good strength; good sensation B Rt groin site clean and dry No bleeding; no hematoma Rt foot 2+ pulses UOP great; yellow  Results for orders placed or performed during the hospital encounter of 04/24/16  MRSA PCR Screening  Result Value Ref Range   MRSA by PCR NEGATIVE NEGATIVE  Basic metabolic panel  Result Value Ref Range   Sodium 136 135 - 145 mmol/L   Potassium 4.7 3.5 - 5.1 mmol/L   Chloride 102 101 - 111  mmol/L   CO2 23 22 - 32 mmol/L   Glucose, Bld 227 (H) 65 - 99 mg/dL   BUN 11 6 - 20 mg/dL   Creatinine, Ser 0.99 0.61 - 1.24 mg/dL   Calcium 9.5 8.9 - 10.3 mg/dL   GFR calc non Af Amer >60 >60 mL/min   GFR calc Af Amer >60 >60 mL/min   Anion gap 11 5 - 15  Platelet inhibition p2y12 (not at [facility])  Result Value Ref Range   Platelet Function  P2Y12 46 (L) 194 - 418 PRU  Glucose, capillary  Result Value Ref Range   Glucose-Capillary 232 (H) 65 - 99 mg/dL  Glucose, capillary  Result Value Ref Range   Glucose-Capillary 199 (H) 65 - 99 mg/dL   Comment 1 Notify RN    Comment 2 Document in Chart   Heparin level (unfractionated)  Result Value Ref Range   Heparin Unfractionated <0.10 (L) 0.30 - 0.70 IU/mL  Glucose, capillary  Result Value Ref Range   Glucose-Capillary 160 (H) 65 - 99 mg/dL  Basic metabolic panel  Result Value Ref Range   Sodium 140 135 - 145 mmol/L   Potassium 3.7 3.5 - 5.1 mmol/L   Chloride 107 101 - 111 mmol/L   CO2 26 22 - 32 mmol/L   Glucose, Bld 136 (H) 65 - 99 mg/dL   BUN 8 6 - 20 mg/dL   Creatinine, Ser 0.87 0.61 - 1.24 mg/dL   Calcium 8.2 (L) 8.9 - 10.3 mg/dL   GFR calc non Af Amer >60 >60 mL/min   GFR  calc Af Amer >60 >60 mL/min   Anion gap 7 5 - 15  CBC WITH DIFFERENTIAL  Result Value Ref Range   WBC 12.7 (H) 4.0 - 10.5 K/uL   RBC 4.75 4.22 - 5.81 MIL/uL   Hemoglobin 13.5 13.0 - 17.0 g/dL   HCT 41.0 39.0 - 52.0 %   MCV 86.3 78.0 - 100.0 fL   MCH 28.4 26.0 - 34.0 pg   MCHC 32.9 30.0 - 36.0 g/dL   RDW 13.3 11.5 - 15.5 %   Platelets 258 150 - 400 K/uL   Neutrophils Relative % 74 %   Neutro Abs 9.4 (H) 1.7 - 7.7 K/uL   Lymphocytes Relative 18 %   Lymphs Abs 2.3 0.7 - 4.0 K/uL   Monocytes Relative 7 %   Monocytes Absolute 0.9 0.1 - 1.0 K/uL   Eosinophils Relative 1 %   Eosinophils Absolute 0.1 0.0 - 0.7 K/uL   Basophils Relative 0 %   Basophils Absolute 0.0 0.0 - 0.1 K/uL  Glucose, capillary  Result Value Ref Range   Glucose-Capillary 153 (H)  65 - 99 mg/dL  Glucose, capillary  Result Value Ref Range   Glucose-Capillary 179 (H) 65 - 99 mg/dL  Platelet inhibition p2y12 (Not at [facility])  Result Value Ref Range   Platelet Function  P2Y12 166 (L) 194 - 418 PRU     Disposition: Right middle cerebral artery aneurysm embolization 8/21 in IR with Dr Estanislado Pandy Plan for 2 week follow up  Continue all meds; restart Glucophage 8/24 Continue ASA 81 mg; Brilinta 45 mg BID Pt and wife have good understanding of all discharge instructions Dr Estanislado Pandy has seen and examined pt Pt will hear form scheduler for appt time and date Referral to Dr Michel Santee for migraine management provided to pt  Discharge Instructions    Call MD for:  difficulty breathing, headache or visual disturbances    Complete by:  As directed   Call MD for:  extreme fatigue    Complete by:  As directed   Call MD for:  hives    Complete by:  As directed   Call MD for:  persistant dizziness or light-headedness    Complete by:  As directed   Call MD for:  persistant nausea and vomiting    Complete by:  As directed   Call MD for:  redness, tenderness, or signs of infection (pain, swelling, redness, odor or green/yellow discharge around incision site)    Complete by:  As directed   Call MD for:  severe uncontrolled pain    Complete by:  As directed   Call MD for:  temperature >100.4    Complete by:  As directed   Diet - low sodium heart healthy    Complete by:  As directed   Discharge instructions    Complete by:  As directed   Resume all meds--Continue Brilinta 45 mg BID and ASA 325 mg daily; restart Glucophage 8/24; 2 week follow up appt with Dr Estanislado Pandy; call [phone] if questions or concerns   Discharge wound care:    Complete by:  As directed   May shower today; keep clean band aid on Rt grin site daily x 1 week   Driving Restrictions    Complete by:  As directed   No driving x 2 weeks   Increase activity slowly    Complete by:  As directed   Lifting restrictions     Complete by:  As directed   No lifting  over 10 lbs x 2 weeks       Medication List    TAKE these medications   aspirin 81 MG tablet Take 81 mg by mouth daily.   cetirizine 10 MG tablet Commonly known as:  ZYRTEC Take 10 mg by mouth daily.   EQL NASAL SPRAY NO DRIP 0.05 % nasal spray Generic drug:  oxymetazoline Place 1-2 sprays into both nostrils daily as needed for congestion.   glucosamine-chondroitin 500-400 MG tablet Take 1 tablet by mouth 2 (two) times daily.   loteprednol 0.5 % ophthalmic suspension Commonly known as:  LOTEMAX Place 1 drop into both eyes daily as needed (Chronic Red Eye).   metFORMIN 1000 MG tablet Commonly known as:  GLUCOPHAGE Take 1,000 mg by mouth 2 (two) times daily with a meal.   multivitamin with minerals tablet Take 1 tablet by mouth daily.   propranolol 10 MG tablet Commonly known as:  INDERAL Take 10 mg by mouth 2 (two) times daily.   Saw Palmetto 160 MG Caps Take 160 mg by mouth 2 (two) times daily.   ticagrelor 90 MG Tabs tablet Commonly known as:  BRILINTA Take 45 mg by mouth every other day.   trimethoprim-polymyxin b ophthalmic solution Commonly known as:  POLYTRIM Place 1 drop into the left eye 6 (six) times daily.      Follow-up Information    DEVESHWAR, Fritz Pickerel, MD Follow up in 2 week(s).   Specialty:  Interventional Radiology Why:  2 week follow up with Dr Estanislado Pandy ; will hear from scheduler for time and date; call [phone] with questions or concerns Contact information: [address] [place] [address] [phone]            Electronically Signed: Monia Sabal A 04/25/2016, 9:51 AM   I have spent Greater Than 30 Minutes discharging Nicholas Wheeler.

## 2016-04-28 ENCOUNTER — Encounter (HOSPITAL_COMMUNITY): Payer: Self-pay | Admitting: Interventional Radiology

## 2016-05-01 ENCOUNTER — Other Ambulatory Visit (HOSPITAL_COMMUNITY): Payer: Self-pay | Admitting: Interventional Radiology

## 2016-05-01 DIAGNOSIS — I671 Cerebral aneurysm, nonruptured: Secondary | ICD-10-CM

## 2016-05-05 DIAGNOSIS — J320 Chronic maxillary sinusitis: Secondary | ICD-10-CM | POA: Diagnosis not present

## 2016-05-11 ENCOUNTER — Telehealth (HOSPITAL_COMMUNITY): Payer: Self-pay | Admitting: *Deleted

## 2016-05-11 ENCOUNTER — Ambulatory Visit (HOSPITAL_COMMUNITY): Admission: RE | Admit: 2016-05-11 | Payer: BLUE CROSS/BLUE SHIELD | Source: Ambulatory Visit

## 2016-05-11 NOTE — Telephone Encounter (Signed)
Returned call to patient after speaking with Dr. Estanislado Pandy.  Pt is to conitnue taking 1/2 tablet of Brilinta twice a day and ASA 81mg  daily.  He has appointment for Friday 05/19/16 at 1400.

## 2016-05-12 ENCOUNTER — Ambulatory Visit (HOSPITAL_COMMUNITY): Payer: BLUE CROSS/BLUE SHIELD

## 2016-05-19 ENCOUNTER — Ambulatory Visit (HOSPITAL_COMMUNITY)
Admission: RE | Admit: 2016-05-19 | Discharge: 2016-05-19 | Disposition: A | Discharge status: Home / Self Care | Payer: BLUE CROSS/BLUE SHIELD | Source: Ambulatory Visit | Attending: Interventional Radiology | Admitting: Interventional Radiology

## 2016-05-19 ENCOUNTER — Other Ambulatory Visit: Payer: Self-pay | Admitting: General Surgery

## 2016-05-19 DIAGNOSIS — I671 Cerebral aneurysm, nonruptured: Secondary | ICD-10-CM

## 2016-05-19 DIAGNOSIS — R51 Headache: Secondary | ICD-10-CM | POA: Diagnosis not present

## 2016-05-19 HISTORY — PX: IR GENERIC HISTORICAL: IMG1180011

## 2016-05-19 LAB — PLATELET INHIBITION P2Y12: Platelet Function  P2Y12: 2 [PRU] — ABNORMAL LOW (ref 194–418)

## 2016-05-22 ENCOUNTER — Encounter (HOSPITAL_COMMUNITY): Payer: Self-pay | Admitting: Interventional Radiology

## 2016-05-26 ENCOUNTER — Telehealth (HOSPITAL_COMMUNITY): Payer: Self-pay | Admitting: *Deleted

## 2016-05-26 NOTE — Telephone Encounter (Signed)
Called and spoke with patient.  Per Dr. Estanislado Pandy pt is to take 1/2 tablet of Brilinta once a day and continue ASA 81 mg daily.  Repeat p2y12 on Oct 2nd.  Pt verbalized understanding

## 2016-06-02 ENCOUNTER — Other Ambulatory Visit: Payer: Self-pay | Admitting: General Surgery

## 2016-06-02 LAB — PLATELET INHIBITION P2Y12: PLATELET FUNCTION P2Y12: 92 [PRU] — AB (ref 194–418)

## 2016-06-05 ENCOUNTER — Telehealth (HOSPITAL_COMMUNITY): Payer: Self-pay | Admitting: *Deleted

## 2016-06-05 NOTE — Telephone Encounter (Signed)
Per Dr. Estanislado Pandy pt is to continue current dose of Brilinta which is 1/2  Tab daily and ASA 81mg .  Pt voiced understanding

## 2016-06-19 DIAGNOSIS — E1140 Type 2 diabetes mellitus with diabetic neuropathy, unspecified: Secondary | ICD-10-CM | POA: Diagnosis not present

## 2016-06-19 DIAGNOSIS — Z1211 Encounter for screening for malignant neoplasm of colon: Secondary | ICD-10-CM | POA: Diagnosis not present

## 2016-06-19 DIAGNOSIS — J320 Chronic maxillary sinusitis: Secondary | ICD-10-CM | POA: Diagnosis not present

## 2016-06-19 DIAGNOSIS — E1165 Type 2 diabetes mellitus with hyperglycemia: Secondary | ICD-10-CM | POA: Diagnosis not present

## 2016-06-19 DIAGNOSIS — I671 Cerebral aneurysm, nonruptured: Secondary | ICD-10-CM | POA: Diagnosis not present

## 2016-06-19 DIAGNOSIS — Z23 Encounter for immunization: Secondary | ICD-10-CM | POA: Diagnosis not present

## 2016-06-23 DIAGNOSIS — J320 Chronic maxillary sinusitis: Secondary | ICD-10-CM | POA: Diagnosis not present

## 2016-06-23 DIAGNOSIS — K047 Periapical abscess without sinus: Secondary | ICD-10-CM | POA: Diagnosis not present

## 2016-06-28 ENCOUNTER — Encounter: Payer: Self-pay | Admitting: Podiatry

## 2016-06-28 ENCOUNTER — Ambulatory Visit (INDEPENDENT_AMBULATORY_CARE_PROVIDER_SITE_OTHER): Payer: BLUE CROSS/BLUE SHIELD | Admitting: Podiatry

## 2016-06-28 VITALS — BP 139/82 | HR 99 | Resp 18

## 2016-06-28 DIAGNOSIS — M79676 Pain in unspecified toe(s): Secondary | ICD-10-CM

## 2016-06-28 DIAGNOSIS — E1142 Type 2 diabetes mellitus with diabetic polyneuropathy: Secondary | ICD-10-CM | POA: Diagnosis not present

## 2016-06-28 DIAGNOSIS — M79675 Pain in left toe(s): Secondary | ICD-10-CM

## 2016-06-28 DIAGNOSIS — M79604 Pain in right leg: Secondary | ICD-10-CM

## 2016-06-28 DIAGNOSIS — M79605 Pain in left leg: Secondary | ICD-10-CM

## 2016-06-28 NOTE — Progress Notes (Signed)
   Subjective:    Patient ID: Nicholas Wheeler, male    DOB: June 05, 1961, 55 y.o.   MRN: [id number]  HPI    This patient presents today stating that he is a diabetic for approximately 5 years and has numbness , tingling, hot scalding-like sensation in his feet, and  is using over-the-counter lidocaine to reduce some of the symptoms . He describes some partial relief from the lidocaine but not complete he states that sometimes the symptoms make it difficult for him to fall asleep. He also states he has difficulty trimming his toenails request the toenails debrided today  He denies any history of foot ulceration, claudication or at dictation  Review of Systems  HENT: Positive for sinus pressure and sneezing.        Ring in ears  Eyes: Positive for visual disturbance.  Musculoskeletal: Positive for back pain.       Muscle and joint pain  Allergic/Immunologic: Positive for environmental allergies.  Neurological: Positive for dizziness, light-headedness, numbness and headaches.  All other systems reviewed and are negative.      Objective:   Physical Exam  Patient's wife is present in the treatment room  Vascular: No peripheral edema noted bilaterally DP and PT pulses 2/4 bilaterally Capillary reflex immediate bilaterally  Neurological: Sensation to 10 g monofilament wire intact 5/5 bilaterally Vibratory sensation reactive bilaterally Ankle reflex equal and reactive bilaterally  Dermatological: Old skin lesions bilaterally Small area of scaling skin plantar sulcus area fifth left toe without any surrounding erythema, edema, warmth, drainage The toenails elongated incurvated and are normal trophic in texture and color  Musculoskeletal: Pes planus bilaterally Manual motor testing: Dorsi flexion, plantar flexion, inversion, eversion 5/5 bilaterally      Assessment & Plan:   Assessment: Satisfactory vascular status Protective sensation intact Diabetic peripheral  neuropathy  Plan: Reviewed the results of examination with patient today. We discussed treatment options for diabetic peripheral neuropathy including the use of topical lidocaine versus compounding medication versus anti-seizure medicine such as gabapentin. Patient states that he does not want to use any oral medication at this time. I encouraged him to use lidocaine on a more frequent basis rather than at nighttime perhaps use it several times during the day. If the symptoms worsen or he wishes changes mind would consider compounding medication oral consider possibility of gabapentin or Lyrica  Reappoint at patient's request or yearly

## 2016-06-28 NOTE — Patient Instructions (Signed)
Today your diabetic foot exam demonstrated adequate circulation and intact protective sensation. There was a small scaling area at the base of the fifth left toe which is healed. Apply skin lotion to the area daily You also have symptoms that are consistent with diabetic peripheral neuropathy and currently your topical application of lidocaine is one method of reducing some of the symptoms. Also, there are topical medications prescribed by the compounding pharmacy that may have the benefit of lidocaine does not provide adequate relief as well as possible oral medication Return as needed or yearly  Diabetes and Foot Care Diabetes may cause you to have problems because of poor blood supply (circulation) to your feet and legs. This may cause the skin on your feet to become thinner, break easier, and heal more slowly. Your skin may become dry, and the skin may peel and crack. You may also have nerve damage in your legs and feet causing decreased feeling in them. You may not notice minor injuries to your feet that could lead to infections or more serious problems. Taking care of your feet is one of the most important things you can do for yourself.  HOME CARE INSTRUCTIONS  Wear shoes at all times, even in the house. Do not go barefoot. Bare feet are easily injured.  Check your feet daily for blisters, cuts, and redness. If you cannot see the bottom of your feet, use a mirror or ask someone for help.  Wash your feet with warm water (do not use hot water) and mild soap. Then pat your feet and the areas between your toes until they are completely dry. Do not soak your feet as this can dry your skin.  Apply a moisturizing lotion or petroleum jelly (that does not contain alcohol and is unscented) to the skin on your feet and to dry, brittle toenails. Do not apply lotion between your toes.  Trim your toenails straight across. Do not dig under them or around the cuticle. File the edges of your nails with an  emery board or nail file.  Do not cut corns or calluses or try to remove them with medicine.  Wear clean socks or stockings every day. Make sure they are not too tight. Do not wear knee-high stockings since they may decrease blood flow to your legs.  Wear shoes that fit properly and have enough cushioning. To break in new shoes, wear them for just a few hours a day. This prevents you from injuring your feet. Always look in your shoes before you put them on to be sure there are no objects inside.  Do not cross your legs. This may decrease the blood flow to your feet.  If you find a minor scrape, cut, or break in the skin on your feet, keep it and the skin around it clean and dry. These areas may be cleansed with mild soap and water. Do not cleanse the area with peroxide, alcohol, or iodine.  When you remove an adhesive bandage, be sure not to damage the skin around it.  If you have a wound, look at it several times a day to make sure it is healing.  Do not use heating pads or hot water bottles. They may burn your skin. If you have lost feeling in your feet or legs, you may not know it is happening until it is too late.  Make sure your health care provider performs a complete foot exam at least annually or more often if you have foot  problems. Report any cuts, sores, or bruises to your health care provider immediately. SEEK MEDICAL CARE IF:   You have an injury that is not healing.  You have cuts or breaks in the skin.  You have an ingrown nail.  You notice redness on your legs or feet.  You feel burning or tingling in your legs or feet.  You have pain or cramps in your legs and feet.  Your legs or feet are numb.  Your feet always feel cold. SEEK IMMEDIATE MEDICAL CARE IF:   There is increasing redness, swelling, or pain in or around a wound.  There is a red line that goes up your leg.  Pus is coming from a wound.  You develop a fever or as directed by your health care  provider.  You notice a bad smell coming from an ulcer or wound.   This information is not intended to replace advice given to you by your health care provider. Make sure you discuss any questions you have with your health care provider.   Document Released: 08/18/2000 Document Revised: 04/23/2013 Document Reviewed: 01/28/2013 Elsevier Interactive Patient Education [employer].

## 2016-06-29 DIAGNOSIS — G43119 Migraine with aura, intractable, without status migrainosus: Secondary | ICD-10-CM | POA: Diagnosis not present

## 2016-06-29 DIAGNOSIS — H532 Diplopia: Secondary | ICD-10-CM | POA: Diagnosis not present

## 2016-06-29 DIAGNOSIS — I671 Cerebral aneurysm, nonruptured: Secondary | ICD-10-CM | POA: Diagnosis not present

## 2016-06-29 DIAGNOSIS — G44221 Chronic tension-type headache, intractable: Secondary | ICD-10-CM | POA: Diagnosis not present

## 2016-07-04 ENCOUNTER — Other Ambulatory Visit (HOSPITAL_COMMUNITY): Payer: Self-pay | Admitting: Otolaryngology

## 2016-07-04 DIAGNOSIS — K047 Periapical abscess without sinus: Secondary | ICD-10-CM | POA: Diagnosis not present

## 2016-07-04 DIAGNOSIS — J320 Chronic maxillary sinusitis: Secondary | ICD-10-CM | POA: Diagnosis not present

## 2016-07-12 ENCOUNTER — Ambulatory Visit (HOSPITAL_COMMUNITY)
Admission: RE | Admit: 2016-07-12 | Discharge: 2016-07-12 | Disposition: A | Discharge status: Home / Self Care | Payer: BLUE CROSS/BLUE SHIELD | Source: Ambulatory Visit | Attending: Otolaryngology | Admitting: Otolaryngology

## 2016-07-12 DIAGNOSIS — J320 Chronic maxillary sinusitis: Secondary | ICD-10-CM

## 2016-07-12 DIAGNOSIS — J329 Chronic sinusitis, unspecified: Secondary | ICD-10-CM | POA: Diagnosis not present

## 2016-07-13 ENCOUNTER — Other Ambulatory Visit: Payer: Self-pay | Admitting: Neurology

## 2016-07-13 DIAGNOSIS — R51 Headache: Secondary | ICD-10-CM

## 2016-07-13 DIAGNOSIS — R519 Headache, unspecified: Secondary | ICD-10-CM

## 2016-07-13 DIAGNOSIS — H532 Diplopia: Secondary | ICD-10-CM

## 2016-07-20 DIAGNOSIS — D1801 Hemangioma of skin and subcutaneous tissue: Secondary | ICD-10-CM | POA: Diagnosis not present

## 2016-07-20 DIAGNOSIS — D225 Melanocytic nevi of trunk: Secondary | ICD-10-CM | POA: Diagnosis not present

## 2016-07-20 DIAGNOSIS — Z85828 Personal history of other malignant neoplasm of skin: Secondary | ICD-10-CM | POA: Diagnosis not present

## 2016-07-20 DIAGNOSIS — L918 Other hypertrophic disorders of the skin: Secondary | ICD-10-CM | POA: Diagnosis not present

## 2016-07-20 DIAGNOSIS — L738 Other specified follicular disorders: Secondary | ICD-10-CM | POA: Diagnosis not present

## 2016-07-25 ENCOUNTER — Ambulatory Visit
Admission: RE | Admit: 2016-07-25 | Discharge: 2016-07-25 | Disposition: A | Discharge status: Home / Self Care | Payer: BLUE CROSS/BLUE SHIELD | Source: Ambulatory Visit | Attending: Neurology | Admitting: Neurology

## 2016-07-25 DIAGNOSIS — R51 Headache: Secondary | ICD-10-CM

## 2016-07-25 DIAGNOSIS — H532 Diplopia: Secondary | ICD-10-CM

## 2016-07-25 DIAGNOSIS — R519 Headache, unspecified: Secondary | ICD-10-CM

## 2016-07-25 MED ORDER — GADOBENATE DIMEGLUMINE 529 MG/ML IV SOLN
20.0000 mL | Freq: Once | INTRAVENOUS | Status: AC | PRN
  Administered 2016-07-25: 20 mL via INTRAVENOUS

## 2016-08-03 ENCOUNTER — Other Ambulatory Visit (HOSPITAL_COMMUNITY): Payer: Self-pay | Admitting: Interventional Radiology

## 2016-08-03 DIAGNOSIS — I729 Aneurysm of unspecified site: Secondary | ICD-10-CM

## 2016-08-08 DIAGNOSIS — H532 Diplopia: Secondary | ICD-10-CM | POA: Diagnosis not present

## 2016-08-08 DIAGNOSIS — I671 Cerebral aneurysm, nonruptured: Secondary | ICD-10-CM | POA: Diagnosis not present

## 2016-08-08 DIAGNOSIS — G43719 Chronic migraine without aura, intractable, without status migrainosus: Secondary | ICD-10-CM | POA: Diagnosis not present

## 2016-08-08 DIAGNOSIS — R51 Headache: Secondary | ICD-10-CM | POA: Diagnosis not present

## 2016-08-09 DIAGNOSIS — G4733 Obstructive sleep apnea (adult) (pediatric): Secondary | ICD-10-CM | POA: Diagnosis not present

## 2016-08-14 ENCOUNTER — Other Ambulatory Visit: Payer: Self-pay | Admitting: Neurology

## 2016-08-14 DIAGNOSIS — G4452 New daily persistent headache (NDPH): Secondary | ICD-10-CM

## 2016-08-18 ENCOUNTER — Ambulatory Visit (HOSPITAL_COMMUNITY): Admission: RE | Admit: 2016-08-18 | Payer: BLUE CROSS/BLUE SHIELD | Source: Ambulatory Visit

## 2016-08-22 ENCOUNTER — Other Ambulatory Visit: Payer: Self-pay | Admitting: Radiology

## 2016-08-22 ENCOUNTER — Ambulatory Visit (HOSPITAL_COMMUNITY): Admission: RE | Admit: 2016-08-22 | Payer: BLUE CROSS/BLUE SHIELD | Source: Ambulatory Visit

## 2016-08-23 ENCOUNTER — Other Ambulatory Visit: Payer: Self-pay | Admitting: General Surgery

## 2016-08-24 ENCOUNTER — Other Ambulatory Visit (HOSPITAL_COMMUNITY): Payer: Self-pay | Admitting: Interventional Radiology

## 2016-08-24 ENCOUNTER — Ambulatory Visit (HOSPITAL_COMMUNITY)
Admission: RE | Admit: 2016-08-24 | Discharge: 2016-08-24 | Disposition: A | Discharge status: Home / Self Care | Payer: BLUE CROSS/BLUE SHIELD | Source: Ambulatory Visit | Attending: Interventional Radiology | Admitting: Interventional Radiology

## 2016-08-24 DIAGNOSIS — Z8679 Personal history of other diseases of the circulatory system: Secondary | ICD-10-CM | POA: Diagnosis not present

## 2016-08-24 DIAGNOSIS — E1140 Type 2 diabetes mellitus with diabetic neuropathy, unspecified: Secondary | ICD-10-CM | POA: Insufficient documentation

## 2016-08-24 DIAGNOSIS — I671 Cerebral aneurysm, nonruptured: Secondary | ICD-10-CM | POA: Insufficient documentation

## 2016-08-24 DIAGNOSIS — G43909 Migraine, unspecified, not intractable, without status migrainosus: Secondary | ICD-10-CM | POA: Insufficient documentation

## 2016-08-24 DIAGNOSIS — Z85828 Personal history of other malignant neoplasm of skin: Secondary | ICD-10-CM | POA: Diagnosis not present

## 2016-08-24 DIAGNOSIS — G4710 Hypersomnia, unspecified: Secondary | ICD-10-CM | POA: Insufficient documentation

## 2016-08-24 DIAGNOSIS — Z7984 Long term (current) use of oral hypoglycemic drugs: Secondary | ICD-10-CM | POA: Insufficient documentation

## 2016-08-24 DIAGNOSIS — Z6841 Body Mass Index (BMI) 40.0 and over, adult: Secondary | ICD-10-CM | POA: Diagnosis not present

## 2016-08-24 DIAGNOSIS — Z87891 Personal history of nicotine dependence: Secondary | ICD-10-CM | POA: Diagnosis not present

## 2016-08-24 DIAGNOSIS — K219 Gastro-esophageal reflux disease without esophagitis: Secondary | ICD-10-CM | POA: Diagnosis not present

## 2016-08-24 DIAGNOSIS — I729 Aneurysm of unspecified site: Secondary | ICD-10-CM

## 2016-08-24 DIAGNOSIS — G4730 Sleep apnea, unspecified: Secondary | ICD-10-CM | POA: Diagnosis not present

## 2016-08-24 DIAGNOSIS — Z7982 Long term (current) use of aspirin: Secondary | ICD-10-CM | POA: Insufficient documentation

## 2016-08-24 DIAGNOSIS — M26609 Unspecified temporomandibular joint disorder, unspecified side: Secondary | ICD-10-CM | POA: Insufficient documentation

## 2016-08-24 DIAGNOSIS — R51 Headache: Secondary | ICD-10-CM | POA: Diagnosis present

## 2016-08-24 DIAGNOSIS — E669 Obesity, unspecified: Secondary | ICD-10-CM | POA: Insufficient documentation

## 2016-08-24 HISTORY — PX: IR GENERIC HISTORICAL: IMG1180011

## 2016-08-24 LAB — BASIC METABOLIC PANEL
ANION GAP: 12 (ref 5–15)
BUN: 12 mg/dL (ref 6–20)
CALCIUM: 9.3 mg/dL (ref 8.9–10.3)
CHLORIDE: 102 mmol/L (ref 101–111)
CO2: 21 mmol/L — AB (ref 22–32)
Creatinine, Ser: 1.1 mg/dL (ref 0.61–1.24)
GFR calc non Af Amer: >60 mL/min (ref >60)
Glucose, Bld: 263 mg/dL — ABNORMAL HIGH (ref 65–99)
Potassium: 4.3 mmol/L (ref 3.5–5.1)
Sodium: 135 mmol/L (ref 135–145)

## 2016-08-24 LAB — CBC
HCT: 46.2 % (ref 39.0–52.0)
HEMOGLOBIN: 16.3 g/dL (ref 13.0–17.0)
MCH: 28.5 pg (ref 26.0–34.0)
MCHC: 35.3 g/dL (ref 30.0–36.0)
MCV: 80.9 fL (ref 78.0–100.0)
Platelets: 260 10*3/uL (ref 150–400)
RBC: 5.71 MIL/uL (ref 4.22–5.81)
RDW: 12.8 % (ref 11.5–15.5)
WBC: 12.1 10*3/uL — ABNORMAL HIGH (ref 4.0–10.5)

## 2016-08-24 LAB — PROTIME-INR
INR: 1.03
PROTHROMBIN TIME: 13.5 s (ref 11.4–15.2)

## 2016-08-24 LAB — GLUCOSE, CAPILLARY
GLUCOSE-CAPILLARY: 236 mg/dL — AB (ref 65–99)
GLUCOSE-CAPILLARY: 250 mg/dL — AB (ref 65–99)
Glucose-Capillary: 211 mg/dL — ABNORMAL HIGH (ref 65–99)

## 2016-08-24 LAB — APTT: aPTT: 31 seconds (ref 24–36)

## 2016-08-24 MED ORDER — DIPHENHYDRAMINE HCL 50 MG PO CAPS
50.0000 mg | ORAL_CAPSULE | Freq: Once | ORAL | Status: AC
  Administered 2016-08-24: 50 mg via ORAL
  Filled 2016-08-24: qty 1

## 2016-08-24 MED ORDER — INSULIN ASPART 100 UNIT/ML ~~LOC~~ SOLN
0.0000 [IU] | Freq: Three times a day (TID) | SUBCUTANEOUS | Status: DC
Start: 2016-08-24 — End: 2016-08-25
  Administered 2016-08-24 (×2): 5 [IU] via SUBCUTANEOUS

## 2016-08-24 MED ORDER — LIDOCAINE HCL 1 % IJ SOLN
INTRAMUSCULAR | Status: AC
  Filled 2016-08-24: qty 20

## 2016-08-24 MED ORDER — INSULIN ASPART 100 UNIT/ML ~~LOC~~ SOLN
SUBCUTANEOUS | Status: AC
  Filled 2016-08-24: qty 1

## 2016-08-24 MED ORDER — SODIUM CHLORIDE 0.9 % IV SOLN: Freq: Once | INTRAVENOUS | Status: DC

## 2016-08-24 MED ORDER — FENTANYL CITRATE (PF) 100 MCG/2ML IJ SOLN
INTRAMUSCULAR | Status: AC
  Filled 2016-08-24: qty 2

## 2016-08-24 MED ORDER — MIDAZOLAM HCL 2 MG/2ML IJ SOLN
INTRAMUSCULAR | Status: AC | PRN
  Administered 2016-08-24: 1 mg via INTRAVENOUS

## 2016-08-24 MED ORDER — LIDOCAINE HCL 1 % IJ SOLN
INTRAMUSCULAR | Status: AC | PRN
  Administered 2016-08-24: 10 mL

## 2016-08-24 MED ORDER — INSULIN ASPART 100 UNIT/ML ~~LOC~~ SOLN
SUBCUTANEOUS | Status: AC
  Administered 2016-08-24: 5 [IU] via SUBCUTANEOUS
  Filled 2016-08-24: qty 1

## 2016-08-24 MED ORDER — MIDAZOLAM HCL 2 MG/2ML IJ SOLN
INTRAMUSCULAR | Status: AC
  Filled 2016-08-24: qty 2

## 2016-08-24 MED ORDER — HEPARIN SODIUM (PORCINE) 1000 UNIT/ML IJ SOLN
INTRAMUSCULAR | Status: AC | PRN
  Administered 2016-08-24: 1000 [IU] via INTRAVENOUS

## 2016-08-24 MED ORDER — IOPAMIDOL (ISOVUE-300) INJECTION 61%
INTRAVENOUS | Status: AC
  Filled 2016-08-24: qty 150

## 2016-08-24 MED ORDER — SODIUM CHLORIDE 0.9 % IV SOLN: INTRAVENOUS | Status: AC

## 2016-08-24 MED ORDER — FENTANYL CITRATE (PF) 100 MCG/2ML IJ SOLN
INTRAMUSCULAR | Status: AC | PRN
  Administered 2016-08-24: 25 ug via INTRAVENOUS

## 2016-08-24 MED ORDER — HEPARIN SODIUM (PORCINE) 1000 UNIT/ML IJ SOLN
INTRAMUSCULAR | Status: AC
  Filled 2016-08-24: qty 2

## 2016-08-24 NOTE — Sedation Documentation (Signed)
5 Fr. Exoseal to right groin 

## 2016-08-24 NOTE — Discharge Instructions (Signed)

## 2016-08-24 NOTE — H&P (Signed)
Chief Complaint: Patient was seen in consultation today for cerebral arteriogram  Referring Physician(s):  Dr. Redmond School  Supervising Physician: Luanne Bras  Patient Status: [facility] - Out-pt  History of Present Illness: Nicholas Wheeler is a 55 y.o. 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  Patient underwent Right common carotid arteriogram followed by stage embolization of right MCA aneurysm with an LVIS JR stent device on 04/24/16.   Most recent MRI (07/25/16) showed: 1. No acute abnormality of the brain or abnormal enhancement. 2. 4 mm focus of enhancement in the right MCA cistern in region of prior aneurysm may represent residual/recurrent aneurysm. Further characterization with CTA or MRA of the head is recommended. 3. Mild paranasal sinus disease.  Patient returns today for follow-up cerebral arteriogram.   Patient has been NPO.  He has taken Brilinta and aspirin today.  Patient does have a contrast allergy.   He took prednisone overnight. RN getting benadryl from pharmacy.  Labs currently being drawn. Note elevated HR and BP.  Will re-check prior to procedure.   Past Medical History:  Diagnosis Date  . Aneurysm ([place])   . Diabetes ([place])   . Diabetic neuropathy ([place])   . GERD (gastroesophageal reflux disease)   . Hypersomnia with sleep apnea, unspecified 09/08/2013  . Joint disorder    degenerated  . Migraine   . Migraine   . Obesity (BMI 35.0-39.9 without comorbidity)   . Recurrent sinus infections   . Skin cancer   . Sleep apnea    saw Dr Brett Fairy in 09/2013; wears CPAP set at 14  . TMJ (temporomandibular joint disorder)   . Wears glasses     Past Surgical History:  Procedure Laterality Date  . CARPAL TUNNEL RELEASE    . COLONOSCOPY W/ BIOPSIES AND POLYPECTOMY    . IR GENERIC HISTORICAL  04/24/2016   IR TRANSCATH/EMBOLIZ 04/24/2016 Luanne Bras, MD [facility]-INTERV RAD  . IR GENERIC HISTORICAL  04/24/2016   IR  ANGIOGRAM FOLLOW UP STUDY 04/24/2016 Luanne Bras, MD [facility]  . IR GENERIC HISTORICAL  04/24/2016   IR NEURO EACH ADD'L AFTER BASIC UNI RIGHT (MS) 04/24/2016 Luanne Bras, MD [facility]  . IR GENERIC HISTORICAL  04/24/2016   IR ANGIO INTRA EXTRACRAN SEL INTERNAL CAROTID UNI R MOD SED 04/24/2016 Luanne Bras, MD [facility]  . IR GENERIC HISTORICAL  05/19/2016   IR RADIOLOGIST EVAL & MGMT 05/19/2016 [facility]  . KNEE ARTHROSCOPY    . LUMBAR DISC SURGERY    . mole surgery    . RADIOLOGY WITH ANESTHESIA N/A 04/24/2016   Procedure: EMBOLIZATION;  Surgeon: Luanne Bras, MD;  Location: [facility];  Service: Radiology;  Laterality: N/A;  . TONSILLECTOMY      Allergies: Keflex [cephalexin]; Plavix [clopidogrel]; Ivp dye [iodinated diagnostic agents]; Verapamil; and Glipizide  Medications: Prior to Admission medications   Medication Sig Start Date End Date Taking? Authorizing Provider  aspirin 81 MG tablet Take 81 mg by mouth daily.   Yes Historical Provider, MD  cetirizine (ZYRTEC) 10 MG tablet Take 10 mg by mouth daily.   Yes Historical Provider, MD  loteprednol (LOTEMAX) 0.5 % ophthalmic suspension Place 1 drop into both eyes daily as needed (Chronic Red Eye).   Yes Historical Provider, MD  metFORMIN (GLUCOPHAGE) 1000 MG tablet Take 1,000 mg by mouth 2 (two) times daily with a meal.   Yes Historical Provider, MD  oxymetazoline (EQL NASAL SPRAY NO DRIP) 0.05 %  nasal spray Place 1-2 sprays into both nostrils daily as needed for congestion.   Yes Historical Provider, MD  ticagrelor (BRILINTA) 90 MG TABS tablet Take 45 mg by mouth daily.    Yes Luanne Bras, MD  glucosamine-chondroitin 500-400 MG tablet Take 1 tablet by mouth 2 (two) times daily.    Historical Provider, MD  Multiple Vitamins-Minerals (MULTIVITAMIN WITH MINERALS) tablet Take 1 tablet by mouth daily.    Historical Provider, MD  Saw Palmetto 160 MG CAPS Take 160 mg by mouth 2 (two) times daily.      Historical Provider, MD     Family History  Problem Relation Age of Onset  . Seizures Mother   . Schizophrenia Father   . Cancer Paternal Grandfather     Social History   Social History  . Marital status: Married    Spouse name: elizabeth  . Number of children: 2  . Years of education: [name]   Occupational History  . [employer]    Social History Main Topics  . Smoking status: Former Research scientist (life sciences)  . Smokeless tobacco: Never Used     Comment: quit smoking 30 years ago 02/09/16  . Alcohol use Yes     Comment: occasional  . Drug use: No  . Sexual activity: Not on file   Other Topics Concern  . Not on file   Social History Narrative   Lives at home with wife    Caffeine use- coffee, 5 cups daily    Review of Systems  Constitutional: Negative for chills and fever.  Respiratory: Negative for cough and shortness of breath.   Cardiovascular: Negative for chest pain.  Neurological: Negative for headaches.  Psychiatric/Behavioral: Negative for behavioral problems and confusion.    Vital Signs: BP (!) 172/95   Pulse (!) 108   Temp 97.9 F (36.6 C)   Resp 18   Ht 5\' 7"  (1.702 m)   Wt 260 lb (117.9 kg)   SpO2 97%   BMI 40.72 kg/m   Physical Exam  Constitutional: He is oriented to person, place, and time. He appears well-developed. No distress.  HENT:  Head: Normocephalic.  Cardiovascular: Regular rhythm.   No murmur heard. tachycardia  Pulmonary/Chest: Effort normal and breath sounds normal. No respiratory distress.  Abdominal: Soft.  Neurological: He is alert and oriented to person, place, and time.  Skin: Skin is warm and dry.  Psychiatric: He has a normal mood and affect. His behavior is normal. Judgment and thought content normal.  Nursing note and vitals reviewed.   Mallampati Score:  MD Evaluation Airway: WNL Heart: WNL Abdomen: WNL Chest/ Lungs: WNL ASA  Classification: 3 Mallampati/Airway Score: Two  Imaging: Mr Jeri Cos [id number] Contrast  Result Date:  07/26/2016 CLINICAL DATA:  55 y/o M; 15 years of migraines and previous aneurysm stented 3 months ago with continued headaches. EXAM: MRI HEAD WITHOUT AND WITH CONTRAST TECHNIQUE: Multiplanar, multiecho pulse sequences of the brain and surrounding structures were obtained without and with intravenous contrast. CONTRAST:  16mL MULTIHANCE GADOBENATE DIMEGLUMINE 529 MG/ML IV SOLN COMPARISON:  07/12/2016 CT head and 06/23/2015 MRI head. FINDINGS: Brain: No diffusion signal abnormality. Right MCA cistern susceptibility artifact from stent. Punctate focus of susceptibility hypointensity and right cerebellar hemisphere is compatible with hemosiderin deposition from old microhemorrhage. No focal mass effect. No significant T2 FLAIR signal abnormality of the brain. Vascular: 4 mm focus of enhancement within the right MCA cistern in region of prior aneurysm may represent residual/ recurrent MCA aneurysm. Skull and upper cervical  spine: Normal marrow signal. Sinuses/Orbits: Mild mucosal thickening in anterior ethmoid air cells and right sphenoid sinus. No abnormal signal of mastoid air cells. Orbits are unremarkable. Other: None. IMPRESSION: 1. No acute abnormality of the brain or abnormal enhancement. 2. 4 mm focus of enhancement in the right MCA cistern in region of prior aneurysm may represent residual/recurrent aneurysm. Further characterization with CTA or MRA of the head is recommended. 3. Mild paranasal sinus disease. These results will be called to the ordering clinician or representative by the Radiologist Assistant, and communication documented in the PACS or zVision Dashboard. Electronically Signed   By: Kristine Garbe M.D.   On: 07/26/2016 01:44    Labs:  CBC:  Recent Labs  02/09/16 0954 04/19/16 1306 04/25/16 0615 08/24/16 0844  WBC 10.0 9.8 12.7* 12.1*  HGB 14.7 14.9 13.5 16.3  HCT 44.7 45.2 41.0 46.2  PLT 252 257 258 260    COAGS:  Recent Labs  01/21/16 0900 02/09/16 0954  04/19/16 1306  INR 1.01 0.96 0.98  APTT 35 35 36    BMP:  Recent Labs  02/09/16 0954 04/19/16 1306 04/24/16 0615 04/25/16 0615  NA 137 136 136 140  K 4.2 4.4 4.7 3.7  CL 104 106 102 107  CO2 25 22 23 26   GLUCOSE 150* 126* 227* 136*  BUN 9 10 11 8   CALCIUM 9.1 9.0 9.5 8.2*  CREATININE 1.00 0.95 0.99 0.87  GFRNONAA >60 >60 >60 >60  GFRAA >60 >60 >60 >60    LIVER FUNCTION TESTS:  Recent Labs  02/09/16 0954 04/19/16 1306  BILITOT 0.6 0.7  AST 24 19  ALT 19 20  ALKPHOS 51 52  PROT 6.9 6.7  ALBUMIN 3.9 3.9    TUMOR MARKERS: No results for input(s): AFPTM, CEA, CA199, CHROMGRNA in the last 8760 hours.  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  Patient is scheduled for procedure today.   Has taken prednisone for contrast allergy.  Risks and benefits discussed with the patient including, but not limited to bleeding, infection, vascular injury or contrast induced renal failure. All of the patient's questions were answered, patient is agreeable to proceed. Consent signed and in chart.  Thank you for this interesting consult.  I greatly enjoyed meeting Nicholas Wheeler and look forward to participating in their care.  A copy of this report was sent to the requesting provider on this date.  Electronically Signed: Docia Barrier 08/24/2016, 9:10 AM   I spent a total of    15 Minutes in face to face in clinical consultation, greater than 50% of which was counseling/coordinating care for right middle cerebral artery aneurysm.

## 2016-08-24 NOTE — Procedures (Signed)
S/P bilateral CCA and Rt VA angiograms. RT CFA approach. Findings. 1.approx 26mm x 3.70mm RT MCA aneurys.

## 2016-08-25 ENCOUNTER — Encounter (HOSPITAL_COMMUNITY): Payer: Self-pay | Admitting: Interventional Radiology

## 2016-08-31 ENCOUNTER — Other Ambulatory Visit: Payer: BLUE CROSS/BLUE SHIELD

## 2016-08-31 DIAGNOSIS — Z86010 Personal history of colonic polyps: Secondary | ICD-10-CM | POA: Diagnosis not present

## 2016-09-05 ENCOUNTER — Telehealth (INDEPENDENT_AMBULATORY_CARE_PROVIDER_SITE_OTHER): Payer: Self-pay | Admitting: Rheumatology

## 2016-09-05 NOTE — Telephone Encounter (Signed)
Wrong Dr Deveshwar/ our Dr Estanislado Pandy does not prescribe Brilinta I have called Loma Sousa and given her the correct number to contact the other Dr Estanislado Pandy

## 2016-09-05 NOTE — Telephone Encounter (Signed)
Patient will be having a colonoscopy on Jan 10th.  Dr Amedeo Plenty w/[facility] calling in regards to Rx Brilanta on HOLD for procedure.  Please contact Courtney today.  They will also be sending a Fax now.

## 2016-09-08 DIAGNOSIS — G4733 Obstructive sleep apnea (adult) (pediatric): Secondary | ICD-10-CM | POA: Diagnosis not present

## 2016-09-11 DIAGNOSIS — G4733 Obstructive sleep apnea (adult) (pediatric): Secondary | ICD-10-CM | POA: Diagnosis not present

## 2016-10-05 ENCOUNTER — Telehealth (HOSPITAL_COMMUNITY): Payer: Self-pay | Admitting: Radiology

## 2016-10-05 ENCOUNTER — Other Ambulatory Visit: Payer: Self-pay | Admitting: Radiology

## 2016-10-05 LAB — PLATELET INHIBITION P2Y12: Platelet Function  P2Y12: 59 [PRU] — ABNORMAL LOW (ref 194–418)

## 2016-10-05 NOTE — Telephone Encounter (Signed)
Returned call to patient concerning questions regarding dental procedures that he needs. I told him we received a clearance request from one dentist's office and Deveshwar had signed and it had been faxed back to them today. He stated there was an oral surgeon who would also be needing clearance as well. He is to call the oral surgeon and have them fax me a request for clearance to be signed by Estanislado Pandy and faxed back.  He is also coming in today at the request of his oral surgeon for a current P2Y12 study.  Once his dental issues are taken care of he will be scheduled for a coiling procedure with Deveshwar. Pt states understanding and is in agreement with this plan. JM

## 2016-10-10 ENCOUNTER — Telehealth (HOSPITAL_COMMUNITY): Payer: Self-pay | Admitting: *Deleted

## 2016-10-10 NOTE — Telephone Encounter (Signed)
Called and spoke with patient regarding P2Y12 results.  Per Dr. Estanislado Pandy patient is to continue current dose of Brilinta.  Pt states trying to get an infected tooth pulled.  He is calling his oral surgeon tomorrow and will cal back to [phone] if he needs any more instructions from Dr. Estanislado Pandy.

## 2016-11-16 DIAGNOSIS — E1140 Type 2 diabetes mellitus with diabetic neuropathy, unspecified: Secondary | ICD-10-CM | POA: Diagnosis not present

## 2016-11-16 DIAGNOSIS — M6208 Separation of muscle (nontraumatic), other site: Secondary | ICD-10-CM | POA: Diagnosis not present

## 2016-11-16 DIAGNOSIS — K047 Periapical abscess without sinus: Secondary | ICD-10-CM | POA: Diagnosis not present

## 2016-11-16 DIAGNOSIS — E1165 Type 2 diabetes mellitus with hyperglycemia: Secondary | ICD-10-CM | POA: Diagnosis not present

## 2016-11-24 DIAGNOSIS — G43719 Chronic migraine without aura, intractable, without status migrainosus: Secondary | ICD-10-CM | POA: Diagnosis not present

## 2016-11-24 DIAGNOSIS — H538 Other visual disturbances: Secondary | ICD-10-CM | POA: Diagnosis not present

## 2017-01-16 DIAGNOSIS — E1140 Type 2 diabetes mellitus with diabetic neuropathy, unspecified: Secondary | ICD-10-CM | POA: Diagnosis not present

## 2017-01-16 DIAGNOSIS — G43909 Migraine, unspecified, not intractable, without status migrainosus: Secondary | ICD-10-CM | POA: Diagnosis not present

## 2017-01-16 DIAGNOSIS — I671 Cerebral aneurysm, nonruptured: Secondary | ICD-10-CM | POA: Diagnosis not present

## 2017-01-16 DIAGNOSIS — E1165 Type 2 diabetes mellitus with hyperglycemia: Secondary | ICD-10-CM | POA: Diagnosis not present

## 2017-02-16 DIAGNOSIS — E1165 Type 2 diabetes mellitus with hyperglycemia: Secondary | ICD-10-CM | POA: Diagnosis not present

## 2017-03-14 DIAGNOSIS — R1030 Lower abdominal pain, unspecified: Secondary | ICD-10-CM | POA: Diagnosis not present

## 2017-03-14 DIAGNOSIS — R361 Hematospermia: Secondary | ICD-10-CM | POA: Diagnosis not present

## 2017-03-14 DIAGNOSIS — Z125 Encounter for screening for malignant neoplasm of prostate: Secondary | ICD-10-CM | POA: Diagnosis not present

## 2017-03-14 DIAGNOSIS — Z1159 Encounter for screening for other viral diseases: Secondary | ICD-10-CM | POA: Diagnosis not present

## 2017-03-16 ENCOUNTER — Other Ambulatory Visit: Payer: Self-pay | Admitting: Family Medicine

## 2017-03-16 DIAGNOSIS — R1030 Lower abdominal pain, unspecified: Secondary | ICD-10-CM

## 2017-03-21 ENCOUNTER — Inpatient Hospital Stay
Admission: RE | Admit: 2017-03-21 | Discharge: 2017-03-21 | Disposition: A | Discharge status: Home / Self Care | Payer: BLUE CROSS/BLUE SHIELD | Source: Ambulatory Visit | Attending: Family Medicine | Admitting: Family Medicine

## 2017-03-21 ENCOUNTER — Telehealth: Payer: Self-pay | Admitting: Radiology

## 2017-03-21 NOTE — Telephone Encounter (Signed)
Called pt and explained times to take Prednisone and Benadryl. [facility]

## 2017-03-21 NOTE — Telephone Encounter (Signed)
Pt unable to talk has asked that I call back . 13 hour prep was [facility] [place].

## 2017-03-22 DIAGNOSIS — G4733 Obstructive sleep apnea (adult) (pediatric): Secondary | ICD-10-CM | POA: Diagnosis not present

## 2017-03-26 ENCOUNTER — Ambulatory Visit: Payer: BLUE CROSS/BLUE SHIELD | Admitting: Allergy and Immunology

## 2017-03-26 ENCOUNTER — Ambulatory Visit
Admission: RE | Admit: 2017-03-26 | Discharge: 2017-03-26 | Disposition: A | Discharge status: Home / Self Care | Payer: BLUE CROSS/BLUE SHIELD | Source: Ambulatory Visit | Attending: Family Medicine | Admitting: Family Medicine

## 2017-03-26 DIAGNOSIS — K760 Fatty (change of) liver, not elsewhere classified: Secondary | ICD-10-CM | POA: Diagnosis not present

## 2017-03-26 DIAGNOSIS — R1030 Lower abdominal pain, unspecified: Secondary | ICD-10-CM

## 2017-03-26 MED ORDER — DIPHENHYDRAMINE HCL 50 MG/ML IJ SOLN: 50.0000 mg | Freq: Once | INTRAMUSCULAR | Status: DC

## 2017-03-26 MED ORDER — PREDNISONE 50 MG PO TABS: 50.0000 mg | ORAL_TABLET | Freq: Four times a day (QID) | ORAL | Status: DC

## 2017-03-26 MED ORDER — DIPHENHYDRAMINE HCL 50 MG PO CAPS: 50.0000 mg | ORAL_CAPSULE | Freq: Once | ORAL | Status: DC

## 2017-03-26 MED ORDER — IOPAMIDOL (ISOVUE-300) INJECTION 61%
100.0000 mL | Freq: Once | INTRAVENOUS | Status: AC | PRN
  Administered 2017-03-26: 100 mL via INTRAVENOUS

## 2017-04-17 ENCOUNTER — Telehealth (HOSPITAL_COMMUNITY): Payer: Self-pay | Admitting: Radiology

## 2017-04-17 NOTE — Telephone Encounter (Signed)
Called pt, left VM for him to call to schedule procedure with Deveshwar. JM

## 2017-04-19 ENCOUNTER — Other Ambulatory Visit (HOSPITAL_COMMUNITY): Payer: Self-pay | Admitting: Interventional Radiology

## 2017-04-19 DIAGNOSIS — I671 Cerebral aneurysm, nonruptured: Secondary | ICD-10-CM

## 2017-04-20 ENCOUNTER — Other Ambulatory Visit (HOSPITAL_COMMUNITY): Payer: Self-pay | Admitting: Radiology

## 2017-04-20 DIAGNOSIS — G43909 Migraine, unspecified, not intractable, without status migrainosus: Secondary | ICD-10-CM | POA: Diagnosis not present

## 2017-04-20 DIAGNOSIS — Z7952 Long term (current) use of systemic steroids: Secondary | ICD-10-CM | POA: Diagnosis not present

## 2017-04-20 DIAGNOSIS — E669 Obesity, unspecified: Secondary | ICD-10-CM | POA: Diagnosis not present

## 2017-04-20 DIAGNOSIS — K219 Gastro-esophageal reflux disease without esophagitis: Secondary | ICD-10-CM | POA: Diagnosis not present

## 2017-04-20 DIAGNOSIS — G4730 Sleep apnea, unspecified: Secondary | ICD-10-CM | POA: Diagnosis not present

## 2017-04-20 DIAGNOSIS — E1140 Type 2 diabetes mellitus with diabetic neuropathy, unspecified: Secondary | ICD-10-CM | POA: Diagnosis not present

## 2017-04-20 DIAGNOSIS — Z7982 Long term (current) use of aspirin: Secondary | ICD-10-CM | POA: Diagnosis not present

## 2017-04-20 DIAGNOSIS — I671 Cerebral aneurysm, nonruptured: Secondary | ICD-10-CM | POA: Diagnosis not present

## 2017-04-20 DIAGNOSIS — M26609 Unspecified temporomandibular joint disorder, unspecified side: Secondary | ICD-10-CM | POA: Diagnosis not present

## 2017-04-20 DIAGNOSIS — Z6837 Body mass index (BMI) 37.0-37.9, adult: Secondary | ICD-10-CM | POA: Diagnosis not present

## 2017-04-20 DIAGNOSIS — Z91041 Radiographic dye allergy status: Secondary | ICD-10-CM | POA: Diagnosis not present

## 2017-04-20 DIAGNOSIS — Z7902 Long term (current) use of antithrombotics/antiplatelets: Secondary | ICD-10-CM | POA: Diagnosis not present

## 2017-04-20 DIAGNOSIS — Z8679 Personal history of other diseases of the circulatory system: Secondary | ICD-10-CM | POA: Diagnosis not present

## 2017-04-20 DIAGNOSIS — Z87891 Personal history of nicotine dependence: Secondary | ICD-10-CM | POA: Diagnosis not present

## 2017-04-20 DIAGNOSIS — G4719 Other hypersomnia: Secondary | ICD-10-CM | POA: Diagnosis not present

## 2017-04-20 DIAGNOSIS — Z7984 Long term (current) use of oral hypoglycemic drugs: Secondary | ICD-10-CM | POA: Diagnosis not present

## 2017-04-20 DIAGNOSIS — R51 Headache: Secondary | ICD-10-CM | POA: Diagnosis present

## 2017-04-20 DIAGNOSIS — M1990 Unspecified osteoarthritis, unspecified site: Secondary | ICD-10-CM | POA: Diagnosis not present

## 2017-04-20 DIAGNOSIS — E119 Type 2 diabetes mellitus without complications: Secondary | ICD-10-CM | POA: Diagnosis not present

## 2017-04-20 LAB — PLATELET INHIBITION P2Y12: Platelet Function  P2Y12: 3 [PRU] — ABNORMAL LOW (ref 194–418)

## 2017-04-26 ENCOUNTER — Other Ambulatory Visit: Payer: Self-pay | Admitting: Radiology

## 2017-04-27 NOTE — Pre-Procedure Instructions (Signed)
Nicholas Wheeler  04/27/2017      [id number] [place] [place] [address] Phone: [phone] Fax: [phone]    Your procedure is scheduled on Aug 29  Report to [facility] at 700 A.M.  Call this number if you have problems the morning of surgery:  [phone]   Remember:  Do not eat food or drink liquids after midnight.  Take these medicines the morning of surgery with A SIP OF WATER Cetirizine (Zyrtec), Aspirin, Ticagrelor (Brinlinta)   Stop taking BC's, Goody's, Ibuprofen, Advil, Motrin, Aleve, Herbal medications, Vitamins    How to Manage Your Diabetes Before and After Surgery  Why is it important to control my blood sugar before and after surgery? . Improving blood sugar levels before and after surgery helps healing and can limit problems. . A way of improving blood sugar control is eating a healthy diet by: o  Eating less sugar and carbohydrates o  Increasing activity/exercise o  Talking with your doctor about reaching your blood sugar goals . High blood sugars (greater than 180 mg/dL) can raise your risk of infections and slow your recovery, so you will need to focus on controlling your diabetes during the weeks before surgery. . Make sure that the doctor who takes care of your diabetes knows about your planned surgery including the date and location.  How do I manage my blood sugar before surgery? . Check your blood sugar at least 4 times a day, starting 2 days before surgery, to make sure that the level is not too high or low. o Check your blood sugar the morning of your surgery when you wake up and every 2 hours until you get to the Short Stay unit. . If your blood sugar is less than 70 mg/dL, you will need to treat for low blood sugar: o Do not take insulin. o Treat a low blood sugar (less than 70 mg/dL) with  cup of clear juice (cranberry or apple), 4 glucose tablets, OR  glucose gel. o Recheck blood sugar in 15 minutes after treatment (to make sure it is greater than 70 mg/dL). If your blood sugar is not greater than 70 mg/dL on recheck, call [phone] for further instructions. . Report your blood sugar to the short stay nurse when you get to Short Stay.  . If you are admitted to the hospital after surgery: o Your blood sugar will be checked by the staff and you will probably be given insulin after surgery (instead of oral diabetes medicines) to make sure you have good blood sugar levels. o The goal for blood sugar control after surgery is 80-180 mg/dL.      WHAT DO I DO ABOUT MY DIABETES MEDICATION?   Marland Kitchen Do not take oral diabetes medicines (pills) the morning of surgery. Invokana and Metformin (Glucophage)  Other Instructions:          Patient Signature:  Date:   Nurse Signature:  Date:   Reviewed and Endorsed by [facility] Patient Education Committee, August 2015  Do not wear jewelry, make-up or nail polish.  Do not wear lotions, powders, or perfumes, or deoderant.  Do not shave 48 hours prior to surgery.  Men may shave face and neck.  Do not bring valuables to the hospital.  [facility] is not responsible for any belongings or valuables.  Contacts, dentures or bridgework may not be worn into surgery.  Leave your  suitcase in the car.  After surgery it may be brought to your room.  For patients admitted to the hospital, discharge time will be determined by your treatment team.  Patients discharged the day of surgery will not be allowed to drive home.   Special instructions:  [facility] - Preparing for Surgery  Before surgery, you can play an important role.  Because skin is not sterile, your skin needs to be as free of germs as possible.  You can reduce the number of germs on you skin by washing with CHG (chlorahexidine gluconate) soap before surgery.  CHG is an antiseptic cleaner which kills germs and bonds with the skin to continue  killing germs even after washing.  Please DO NOT use if you have an allergy to CHG or antibacterial soaps.  If your skin becomes reddened/irritated stop using the CHG and inform your nurse when you arrive at Short Stay.  Do not shave (including legs and underarms) for at least 48 hours prior to the first CHG shower.  You may shave your face.  Please follow these instructions carefully:   1.  Shower with CHG Soap the night before surgery and the  morning of Surgery.  2.  If you choose to wash your hair, wash your hair first as usual with your  normal shampoo.  3.  After you shampoo, rinse your hair and body thoroughly to remove the Shampoo.  4.  Use CHG as you would any other liquid soap.  You can apply chg directly  to the skin and wash gently with scrungie or a clean washcloth.  5.  Apply the CHG Soap to your body ONLY FROM THE NECK DOWN.  Do not use on open wounds or open sores.  Avoid contact with your eyes, ears, mouth and genitals (private parts).  Wash genitals (private parts)  with your normal soap.  6.  Wash thoroughly, paying special attention to the area where your surgery will be performed.  7.  Thoroughly rinse your body with warm water from the neck down.  8.  DO NOT shower/wash with your normal soap after using and rinsing off  the CHG Soap.  9.  Pat yourself dry with a clean towel.            10.  Wear clean pajamas.            11.  Place clean sheets on your bed the night of your first shower and do not sleep with pets.  Day of Surgery  Do not apply any lotions/deoderants the morning of surgery.  Please wear clean clothes to the hospital/surgery center.     Please read over the following fact sheets that you were given. Pain Booklet, Coughing and Deep Breathing and Surgical Site Infection Prevention

## 2017-04-30 ENCOUNTER — Encounter (HOSPITAL_COMMUNITY)
Admission: RE | Admit: 2017-04-30 | Discharge: 2017-04-30 | Disposition: A | Discharge status: Home / Self Care | Payer: BLUE CROSS/BLUE SHIELD | Source: Ambulatory Visit | Attending: Interventional Radiology | Admitting: Interventional Radiology

## 2017-04-30 ENCOUNTER — Encounter (HOSPITAL_COMMUNITY): Payer: Self-pay

## 2017-04-30 DIAGNOSIS — Z87891 Personal history of nicotine dependence: Secondary | ICD-10-CM | POA: Diagnosis not present

## 2017-04-30 DIAGNOSIS — M26609 Unspecified temporomandibular joint disorder, unspecified side: Secondary | ICD-10-CM | POA: Diagnosis not present

## 2017-04-30 DIAGNOSIS — G43909 Migraine, unspecified, not intractable, without status migrainosus: Secondary | ICD-10-CM | POA: Diagnosis not present

## 2017-04-30 DIAGNOSIS — Z7982 Long term (current) use of aspirin: Secondary | ICD-10-CM | POA: Diagnosis not present

## 2017-04-30 DIAGNOSIS — Z7952 Long term (current) use of systemic steroids: Secondary | ICD-10-CM | POA: Diagnosis not present

## 2017-04-30 DIAGNOSIS — G4730 Sleep apnea, unspecified: Secondary | ICD-10-CM | POA: Diagnosis not present

## 2017-04-30 DIAGNOSIS — Z91041 Radiographic dye allergy status: Secondary | ICD-10-CM | POA: Diagnosis not present

## 2017-04-30 DIAGNOSIS — E669 Obesity, unspecified: Secondary | ICD-10-CM | POA: Diagnosis not present

## 2017-04-30 DIAGNOSIS — Z8679 Personal history of other diseases of the circulatory system: Secondary | ICD-10-CM | POA: Diagnosis not present

## 2017-04-30 DIAGNOSIS — G4719 Other hypersomnia: Secondary | ICD-10-CM | POA: Diagnosis not present

## 2017-04-30 DIAGNOSIS — Z7902 Long term (current) use of antithrombotics/antiplatelets: Secondary | ICD-10-CM | POA: Diagnosis not present

## 2017-04-30 DIAGNOSIS — E1140 Type 2 diabetes mellitus with diabetic neuropathy, unspecified: Secondary | ICD-10-CM | POA: Diagnosis not present

## 2017-04-30 DIAGNOSIS — M1990 Unspecified osteoarthritis, unspecified site: Secondary | ICD-10-CM | POA: Diagnosis not present

## 2017-04-30 DIAGNOSIS — Z7984 Long term (current) use of oral hypoglycemic drugs: Secondary | ICD-10-CM | POA: Diagnosis not present

## 2017-04-30 DIAGNOSIS — K219 Gastro-esophageal reflux disease without esophagitis: Secondary | ICD-10-CM | POA: Diagnosis not present

## 2017-04-30 HISTORY — DX: Other complications of anesthesia, initial encounter: T88.59XA

## 2017-04-30 HISTORY — DX: Personal history of other diseases of the digestive system: Z87.19

## 2017-04-30 HISTORY — DX: Adverse effect of unspecified anesthetic, initial encounter: T41.45XA

## 2017-04-30 HISTORY — DX: Unspecified atrioventricular block: I44.30

## 2017-04-30 HISTORY — DX: Unspecified osteoarthritis, unspecified site: M19.90

## 2017-04-30 LAB — CBC WITH DIFFERENTIAL/PLATELET
BASOS ABS: 0 10*3/uL (ref 0.0–0.1)
BASOS PCT: 0 %
EOS ABS: 0.2 10*3/uL (ref 0.0–0.7)
Eosinophils Relative: 3 %
HEMATOCRIT: 45.5 % (ref 39.0–52.0)
HEMOGLOBIN: 14.5 g/dL (ref 13.0–17.0)
Lymphocytes Relative: 27 %
Lymphs Abs: 1.7 10*3/uL (ref 0.7–4.0)
MCH: 26.8 pg (ref 26.0–34.0)
MCHC: 31.9 g/dL (ref 30.0–36.0)
MCV: 84.1 fL (ref 78.0–100.0)
MONOS PCT: 8 %
Monocytes Absolute: 0.5 10*3/uL (ref 0.1–1.0)
NEUTROS ABS: 3.8 10*3/uL (ref 1.7–7.7)
NEUTROS PCT: 62 %
Platelets: 252 10*3/uL (ref 150–400)
RBC: 5.41 MIL/uL (ref 4.22–5.81)
RDW: 14.4 % (ref 11.5–15.5)
WBC: 6.1 10*3/uL (ref 4.0–10.5)

## 2017-04-30 LAB — COMPREHENSIVE METABOLIC PANEL
ALBUMIN: 4.1 g/dL (ref 3.5–5.0)
ALK PHOS: 57 U/L (ref 38–126)
ALT: 24 U/L (ref 17–63)
ANION GAP: 9 (ref 5–15)
AST: 24 U/L (ref 15–41)
BUN: 9 mg/dL (ref 6–20)
CO2: 27 mmol/L (ref 22–32)
Calcium: 9.1 mg/dL (ref 8.9–10.3)
Chloride: 103 mmol/L (ref 101–111)
Creatinine, Ser: 1.02 mg/dL (ref 0.61–1.24)
GFR calc Af Amer: >60 mL/min (ref >60)
GFR calc non Af Amer: >60 mL/min (ref >60)
GLUCOSE: 116 mg/dL — AB (ref 65–99)
POTASSIUM: 4.3 mmol/L (ref 3.5–5.1)
SODIUM: 139 mmol/L (ref 135–145)
TOTAL PROTEIN: 7 g/dL (ref 6.5–8.1)
Total Bilirubin: 0.7 mg/dL (ref 0.3–1.2)

## 2017-04-30 LAB — HEMOGLOBIN A1C
HEMOGLOBIN A1C: 6.9 % — AB (ref 4.8–5.6)
MEAN PLASMA GLUCOSE: 151.33 mg/dL

## 2017-04-30 LAB — PLATELET INHIBITION P2Y12: Platelet Function  P2Y12: 176 [PRU] — ABNORMAL LOW (ref 194–418)

## 2017-04-30 LAB — PROTIME-INR
INR: 0.97
Prothrombin Time: 12.9 seconds (ref 11.4–15.2)

## 2017-04-30 LAB — APTT: APTT: 35 s (ref 24–36)

## 2017-04-30 LAB — GLUCOSE, CAPILLARY: Glucose-Capillary: 111 mg/dL — ABNORMAL HIGH (ref 65–99)

## 2017-04-30 NOTE — Pre-Procedure Instructions (Addendum)
GRIER VU  04/30/2017      [id number] [place] [place] - [place] [address] Phone: [phone] Fax: [phone]    Your procedure is scheduled on Aug 29  Report to [facility] at 700 A.M.  Call this number if you have problems the morning of surgery:  [phone]   Remember:  Do not eat food or drink liquids after midnight.  Take these medicines the morning of surgery with A SIP OF WATER Aspirin, Ticagrelor (Brilinta)   Stop taking BC's, Goody's, Ibuprofen, Advil, Motrin, Aleve, Herbal medications, Vitamins,fish oil  No invokana,metformin am of surgery    How to Manage Your Diabetes Before and After Surgery  Why is it important to control my blood sugar before and after surgery? . Improving blood sugar levels before and after surgery helps healing and can limit problems. . A way of improving blood sugar control is eating a healthy diet by: o  Eating less sugar and carbohydrates o  Increasing activity/exercise o  Talking with your doctor about reaching your blood sugar goals . High blood sugars (greater than 180 mg/dL) can raise your risk of infections and slow your recovery, so you will need to focus on controlling your diabetes during the weeks before surgery. . Make sure that the doctor who takes care of your diabetes knows about your planned surgery including the date and location.  How do I manage my blood sugar before surgery? . Check your blood sugar at least 4 times a day, starting 2 days before surgery, to make sure that the level is not too high or low. o Check your blood sugar the morning of your surgery when you wake up and every 2 hours until you get to the Short Stay unit. . If your blood sugar is less than 70 mg/dL, you will need to treat for low blood sugar: o Do not take insulin. o Treat a low blood sugar (less than 70 mg/dL) with  cup of clear juice (cranberry or apple),  4 glucose tablets, OR glucose gel. o Recheck blood sugar in 15 minutes after treatment (to make sure it is greater than 70 mg/dL). If your blood sugar is not greater than 70 mg/dL on recheck, call [phone] for further instructions. . Report your blood sugar to the short stay nurse when you get to Short Stay.  . If you are admitted to the hospital after surgery: o Your blood sugar will be checked by the staff and you will probably be given insulin after surgery (instead of oral diabetes medicines) to make sure you have good blood sugar levels. o The goal for blood sugar control after surgery is 80-180 mg/dL.    WHAT DO I DO ABOUT MY DIABETES MEDICATION?   Marland Kitchen Do not take oral diabetes medicines (pills) the morning of surgery. Invokana and Metformin (Glucophage)   Do not wear jewelry, make-up or nail polish.  Do not wear lotions, powders, or perfumes, or deoderant.  Do not shave 48 hours prior to surgery.  Men may shave face and neck.  Do not bring valuables to the hospital.  [facility] is not responsible for any belongings or valuables.  Contacts, dentures or bridgework may not be worn into surgery.  Leave your suitcase in the car.  After surgery it may be brought to your room.  For patients admitted to the hospital, discharge time will be determined by your treatment team.  Patients discharged the day of surgery will not be allowed to drive home.   Special instructions:  [place] - Preparing for Surgery  Before surgery, you can play an important role.  Because skin is not sterile, your skin needs to be as free of germs as possible.  You can reduce the number of germs on you skin by washing with CHG (chlorahexidine gluconate) soap before surgery.  CHG is an antiseptic cleaner which kills germs and bonds with the skin to continue killing germs even after washing.  Please DO NOT use if you have an allergy to CHG or antibacterial soaps.  If your skin becomes reddened/irritated stop  using the CHG and inform your nurse when you arrive at Short Stay.  Do not shave (including legs and underarms) for at least 48 hours prior to the first CHG shower.  You may shave your face.  Please follow these instructions carefully:   1.  Shower with CHG Soap the night before surgery and the  morning of Surgery.  2.  If you choose to wash your hair, wash your hair first as usual with your  normal shampoo.  3.  After you shampoo, rinse your hair and body thoroughly to remove the Shampoo.  4.  Use CHG as you would any other liquid soap.  You can apply chg directly  to the skin and wash gently with scrungie or a clean washcloth.  5.  Apply the CHG Soap to your body ONLY FROM THE NECK DOWN.  Do not use on open wounds or open sores.  Avoid contact with your eyes, ears, mouth and genitals (private parts).  Wash genitals (private parts)  with your normal soap.  6.  Wash thoroughly, paying special attention to the area where your surgery will be performed.  7.  Thoroughly rinse your body with warm water from the neck down.  8.  DO NOT shower/wash with your normal soap after using and rinsing off  the CHG Soap.  9.  Pat yourself dry with a clean towel.            10.  Wear clean pajamas.            11.  Place clean sheets on your bed the night of your first shower and do not sleep with pets.  Day of Surgery  Do not apply any lotions/deoderants the morning of surgery.  Please wear clean clothes to the hospital/surgery center.     Please read over the  fact sheets that you were given.

## 2017-05-01 ENCOUNTER — Other Ambulatory Visit: Payer: Self-pay | Admitting: Student

## 2017-05-01 MED ORDER — VANCOMYCIN HCL 1000 MG IV SOLR
1500.0000 mg | INTRAVENOUS | Status: DC
  Filled 2017-05-01: qty 2000

## 2017-05-01 MED ORDER — SODIUM CHLORIDE 0.9 % IV SOLN
INTRAVENOUS | Status: DC
  Administered 2017-05-02: 08:00:00 via INTRAVENOUS

## 2017-05-01 NOTE — Anesthesia Preprocedure Evaluation (Addendum)
Anesthesia Evaluation  Patient identified by MRN, date of birth, ID band Patient awake    Reviewed: Allergy & Precautions, H&P , NPO status , Patient's Chart, lab work & pertinent test results  History of Anesthesia Complications Negative for: history of anesthetic complications  Airway Mallampati: II  TM Distance: >3 FB Neck ROM: full    Dental no notable dental hx. (+) Dental Advisory Given   Pulmonary sleep apnea and Continuous Positive Airway Pressure Ventilation , former smoker,    Pulmonary exam normal breath sounds clear to auscultation       Cardiovascular + Peripheral Vascular Disease  Normal cardiovascular exam Rhythm:regular Rate:Normal     Neuro/Psych  Headaches, TMJ dysfunction, Cerebral Aneurysm negative psych ROS   GI/Hepatic Neg liver ROS, hiatal hernia, GERD  ,  Endo/Other  diabetes, Well Controlled, Type 2, Oral Hypoglycemic AgentsObesity  Renal/GU negative Renal ROS  negative genitourinary   Musculoskeletal  (+) Arthritis ,   Abdominal   Peds  Hematology negative hematology ROS (+)   Anesthesia Other Findings   Reproductive/Obstetrics                            Anesthesia Physical  Anesthesia Plan  ASA: III  Anesthesia Plan: General and MAC   Post-op Pain Management:    Induction: Intravenous  PONV Risk Score and Plan: 2 and Ondansetron, Dexamethasone, Midazolam and Treatment may vary due to age or medical condition  Airway Management Planned: Oral ETT  Additional Equipment: Arterial line  Intra-op Plan:   Post-operative Plan: Extubation in OR  Informed Consent: I have reviewed the patients History and Physical, chart, labs and discussed the procedure including the risks, benefits and alternatives for the proposed anesthesia with the patient or authorized representative who has indicated his/her understanding and acceptance.   Dental Advisory  Given  Plan Discussed with: CRNA  Anesthesia Plan Comments: (Case will begin as MAC for angiogram. If radiologist determines need for embolization, will place A-line and induce GA.)       Anesthesia Quick Evaluation

## 2017-05-01 NOTE — Progress Notes (Signed)
Anesthesia chart review:  Patient is a 56 year old male scheduled for cerebral arteriogram with possible embolization of our middle cerebral artery aneurysm on 05/02/2017 with Luanne Bras, M.D.  - PCP Antony Contras, MD  PMH includes: Cerebral aneurysm, DM, OSA, GERD. Former smoker. BMI 38. 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   Medications include: ASA 81 mg, invokana, metformin, prednisone, Brilinta. Patient to continue ASA and Brilinta perioperatively.  Preoperative labs reviewed.  - HbA1c 6.9, glucose 116 - P2Y12 176 - Monia Sabal, [place] has reviewed labs.   EKG 04/30/17: NSR.   If no changes, I anticipate pt can proceed with surgery as scheduled.   Willeen Cass, FNP-BC [facility]/Anesthesiology Phone: [phone] 05/01/2017 4:14 PM

## 2017-05-02 ENCOUNTER — Encounter (HOSPITAL_COMMUNITY)
Admission: RE | Disposition: A | Discharge status: Home / Self Care | Payer: Self-pay | Source: Ambulatory Visit | Attending: Interventional Radiology

## 2017-05-02 ENCOUNTER — Ambulatory Visit (HOSPITAL_COMMUNITY): Payer: BLUE CROSS/BLUE SHIELD | Admitting: Anesthesiology

## 2017-05-02 ENCOUNTER — Ambulatory Visit (HOSPITAL_COMMUNITY)
Admission: RE | Admit: 2017-05-02 | Discharge: 2017-05-02 | Disposition: A | Discharge status: Home / Self Care | Payer: BLUE CROSS/BLUE SHIELD | Source: Ambulatory Visit | Attending: Interventional Radiology | Admitting: Interventional Radiology

## 2017-05-02 ENCOUNTER — Ambulatory Visit (HOSPITAL_COMMUNITY): Payer: BLUE CROSS/BLUE SHIELD | Admitting: Emergency Medicine

## 2017-05-02 ENCOUNTER — Encounter (HOSPITAL_COMMUNITY): Payer: Self-pay | Admitting: Certified Registered Nurse Anesthetist

## 2017-05-02 DIAGNOSIS — Z8679 Personal history of other diseases of the circulatory system: Secondary | ICD-10-CM | POA: Diagnosis not present

## 2017-05-02 DIAGNOSIS — Z6837 Body mass index (BMI) 37.0-37.9, adult: Secondary | ICD-10-CM

## 2017-05-02 DIAGNOSIS — G4730 Sleep apnea, unspecified: Secondary | ICD-10-CM | POA: Insufficient documentation

## 2017-05-02 DIAGNOSIS — K219 Gastro-esophageal reflux disease without esophagitis: Secondary | ICD-10-CM | POA: Insufficient documentation

## 2017-05-02 DIAGNOSIS — M26609 Unspecified temporomandibular joint disorder, unspecified side: Secondary | ICD-10-CM | POA: Diagnosis not present

## 2017-05-02 DIAGNOSIS — Z87891 Personal history of nicotine dependence: Secondary | ICD-10-CM | POA: Insufficient documentation

## 2017-05-02 DIAGNOSIS — G4719 Other hypersomnia: Secondary | ICD-10-CM | POA: Insufficient documentation

## 2017-05-02 DIAGNOSIS — E669 Obesity, unspecified: Secondary | ICD-10-CM

## 2017-05-02 DIAGNOSIS — I671 Cerebral aneurysm, nonruptured: Secondary | ICD-10-CM

## 2017-05-02 DIAGNOSIS — Z7982 Long term (current) use of aspirin: Secondary | ICD-10-CM | POA: Insufficient documentation

## 2017-05-02 DIAGNOSIS — E119 Type 2 diabetes mellitus without complications: Secondary | ICD-10-CM

## 2017-05-02 DIAGNOSIS — Z7952 Long term (current) use of systemic steroids: Secondary | ICD-10-CM | POA: Diagnosis not present

## 2017-05-02 DIAGNOSIS — Z91041 Radiographic dye allergy status: Secondary | ICD-10-CM | POA: Diagnosis not present

## 2017-05-02 DIAGNOSIS — Z7984 Long term (current) use of oral hypoglycemic drugs: Secondary | ICD-10-CM | POA: Insufficient documentation

## 2017-05-02 DIAGNOSIS — E1140 Type 2 diabetes mellitus with diabetic neuropathy, unspecified: Secondary | ICD-10-CM | POA: Diagnosis not present

## 2017-05-02 DIAGNOSIS — G43909 Migraine, unspecified, not intractable, without status migrainosus: Secondary | ICD-10-CM | POA: Diagnosis not present

## 2017-05-02 DIAGNOSIS — M1990 Unspecified osteoarthritis, unspecified site: Secondary | ICD-10-CM | POA: Insufficient documentation

## 2017-05-02 DIAGNOSIS — Z7902 Long term (current) use of antithrombotics/antiplatelets: Secondary | ICD-10-CM | POA: Insufficient documentation

## 2017-05-02 HISTORY — PX: IR ANGIO EXTRACRAN SEL COM CAROTID INNOMINATE UNI R MOD SED: IMG5356

## 2017-05-02 HISTORY — PX: RADIOLOGY WITH ANESTHESIA: SHX6223

## 2017-05-02 LAB — GLUCOSE, CAPILLARY
GLUCOSE-CAPILLARY: 143 mg/dL — AB (ref 65–99)
Glucose-Capillary: 160 mg/dL — ABNORMAL HIGH (ref 65–99)

## 2017-05-02 SURGERY — RADIOLOGY WITH ANESTHESIA
Anesthesia: Monitor Anesthesia Care

## 2017-05-02 MED ORDER — IOPAMIDOL (ISOVUE-300) INJECTION 61%
INTRAVENOUS | Status: AC
  Filled 2017-05-02: qty 150

## 2017-05-02 MED ORDER — TICAGRELOR 90 MG PO TABS
50.0000 mg | ORAL_TABLET | ORAL | Status: DC
  Administered 2017-05-02: 90 mg via ORAL
  Filled 2017-05-02: qty 1

## 2017-05-02 MED ORDER — IOPAMIDOL (ISOVUE-300) INJECTION 61%: 40.0000 mL | Freq: Once | INTRAVENOUS | Status: DC | PRN

## 2017-05-02 MED ORDER — HEPARIN SODIUM (PORCINE) 1000 UNIT/ML IJ SOLN
INTRAMUSCULAR | Status: DC | PRN
  Administered 2017-05-02: 1000 [IU] via INTRAVENOUS

## 2017-05-02 MED ORDER — FENTANYL CITRATE (PF) 100 MCG/2ML IJ SOLN
25.0000 ug | Freq: Once | INTRAMUSCULAR | Status: AC
  Administered 2017-05-02: 25 ug via INTRAVENOUS

## 2017-05-02 MED ORDER — HYDROCODONE-ACETAMINOPHEN 5-325 MG PO TABS
1.0000 | ORAL_TABLET | ORAL | Status: DC | PRN
  Administered 2017-05-02: 1 via ORAL

## 2017-05-02 MED ORDER — LIDOCAINE HCL (PF) 1 % IJ SOLN
INTRAMUSCULAR | Status: AC
  Filled 2017-05-02: qty 30

## 2017-05-02 MED ORDER — FENTANYL CITRATE (PF) 100 MCG/2ML IJ SOLN
INTRAMUSCULAR | Status: DC | PRN
  Administered 2017-05-02: 25 ug via INTRAVENOUS

## 2017-05-02 MED ORDER — MIDAZOLAM HCL 5 MG/5ML IJ SOLN
INTRAMUSCULAR | Status: DC | PRN
  Administered 2017-05-02: 2 mg via INTRAVENOUS

## 2017-05-02 MED ORDER — HYDROCODONE-ACETAMINOPHEN 5-325 MG PO TABS
ORAL_TABLET | ORAL | Status: AC
  Filled 2017-05-02: qty 1

## 2017-05-02 MED ORDER — LACTATED RINGERS IV SOLN
INTRAVENOUS | Status: DC | PRN
  Administered 2017-05-02: 09:00:00 via INTRAVENOUS

## 2017-05-02 MED ORDER — TICAGRELOR 90 MG PO TABS
90.0000 mg | ORAL_TABLET | Freq: Once | ORAL | Status: DC
  Filled 2017-05-02: qty 1

## 2017-05-02 MED ORDER — NITROGLYCERIN 1 MG/10 ML FOR IR/CATH LAB
INTRA_ARTERIAL | Status: AC
  Filled 2017-05-02: qty 10

## 2017-05-02 MED ORDER — LIDOCAINE HCL (PF) 1 % IJ SOLN
INTRAMUSCULAR | Status: DC | PRN
  Administered 2017-05-02: 10 mL

## 2017-05-02 MED ORDER — SODIUM CHLORIDE 0.9 % IV SOLN: INTRAVENOUS | Status: AC

## 2017-05-02 MED ORDER — FENTANYL CITRATE (PF) 100 MCG/2ML IJ SOLN
INTRAMUSCULAR | Status: AC
  Filled 2017-05-02: qty 2

## 2017-05-02 NOTE — Progress Notes (Signed)
Pt took his own medication, as ordered by Dr. Estanislado Pandy of: Prednisone 50mg  Benadryl 50mg  Aspirin 81mg  Zyrtec 10mg  IR PA Kacie made aware.

## 2017-05-02 NOTE — Discharge Instructions (Signed)

## 2017-05-02 NOTE — H&P (Signed)
Chief Complaint: Patient was seen in consultation today for R MCA aneurysm  Referring Physician(s): Dr. Redmond School  Supervising Physician: Luanne Bras  Patient Status: [facility] - Out-pt  History of Present Illness: Nicholas Wheeler is a 56 y.o. male with history of migraine headaches who was found to have R MCA aneurysm s/p angiogram and stage embolization of right MCA aneurysm with an [place] stent device on 04/24/16.   He returned for diagnostic angiogram 08/24/16 which showed: Approximately 4 mm x 3.4 mm right middle cerebral artery bifurcation bilobed aneurysm. Compared to the previous studies this aneurysm demonstrates narrowing of the neck of the aneurysm following the placement of an LVIS Jr. stent as described. The dimensions, otherwise, remain unchanged. Wide patency of the stented portion of the left middle cerebral artery into the superior division as described above.  Patient returns today for diagnostic angiogram with possible intervention.  He has been taking Brilinta 45 mg daily with aspirin 81mg .  He has been NPO.   Past Medical History:  Diagnosis Date  . Aneurysm ([place])   . Arthritis   . AV block 2010   caused by verapamil--given for migraines no problems since   . Complication of anesthesia    "woke up violently after last arteriogram " ?  . Diabetes ([place])   . Diabetic neuropathy ([place])   . GERD (gastroesophageal reflux disease)   . History of hiatal hernia   . Hypersomnia with sleep apnea, unspecified 09/08/2013  . Joint disorder    degenerated  . Migraine   . Migraine   . Obesity (BMI 35.0-39.9 without comorbidity)   . Recurrent sinus infections   . Skin cancer   . Sleep apnea    saw Dr Brett Fairy in 09/2013; wears CPAP set at 14  . TMJ (temporomandibular joint disorder)   . Wears glasses     Past Surgical History:  Procedure Laterality Date  . CARPAL TUNNEL RELEASE Right   . COLONOSCOPY W/ BIOPSIES AND POLYPECTOMY    . IR GENERIC HISTORICAL   04/24/2016   IR TRANSCATH/EMBOLIZ 04/24/2016 Luanne Bras, MD [facility]  . IR GENERIC HISTORICAL  04/24/2016   IR ANGIOGRAM FOLLOW UP STUDY 04/24/2016 Luanne Bras, MD [facility]  . IR GENERIC HISTORICAL  04/24/2016   IR NEURO EACH ADD'L AFTER BASIC UNI RIGHT (MS) 04/24/2016 Luanne Bras, MD [facility]  . IR GENERIC HISTORICAL  04/24/2016   IR ANGIO INTRA EXTRACRAN SEL INTERNAL CAROTID UNI R MOD SED 04/24/2016 Luanne Bras, MD [facility]  . IR GENERIC HISTORICAL  05/19/2016   IR RADIOLOGIST EVAL & MGMT 05/19/2016 [facility]  . IR GENERIC HISTORICAL  08/24/2016   IR ANGIO VERTEBRAL SEL SUBCLAVIAN INNOMINATE UNI R MOD SED 08/24/2016 Luanne Bras, MD [facility]  . IR GENERIC HISTORICAL  08/24/2016   IR ANGIO INTRA EXTRACRAN SEL COM CAROTID INNOMINATE BILAT MOD SED 08/24/2016 Luanne Bras, MD [facility]  . KNEE ARTHROSCOPY Right   . LUMBAR DISC SURGERY    . mole surgery     basal cell mohs face  . RADIOLOGY WITH ANESTHESIA N/A 04/24/2016   Procedure: EMBOLIZATION;  Surgeon: Luanne Bras, MD;  Location: [place];  Service: Radiology;  Laterality: N/A;  . TONSILLECTOMY      Allergies: Keflex [cephalexin]; Plavix [clopidogrel]; Glipizide; Ivp dye [iodinated diagnostic agents]; and Verapamil  Medications: Prior to Admission medications   Medication Sig Start Date End Date Taking? Authorizing Provider  aspirin 81 MG tablet Take 81 mg by mouth  daily.    [provider]  cetirizine (ZYRTEC) 10 MG tablet Take 10 mg by mouth daily.    [provider]  diphenhydrAMINE (BENADRYL) 25 mg capsule Take 25 mg by mouth once. Am preop  Patient will bring    [provider]  glucosamine-chondroitin 500-400 MG tablet Take 1 tablet by mouth 2 (two) times daily.    [provider]  INVOKANA 100 MG TABS tablet Take 100 mg by mouth daily. 02/03/17   [provider]  loteprednol (LOTEMAX) 0.5 % ophthalmic suspension Place 1  drop into both eyes daily as needed (Chronic [place]).    [provider]  metFORMIN (GLUCOPHAGE) 1000 MG tablet Take 1,000 mg by mouth 2 (two) times daily with a meal.    [provider]  oxymetazoline (EQL NASAL SPRAY NO DRIP) 0.05 % nasal spray Place 1-2 sprays into both nostrils daily as needed for congestion.    [provider]  predniSONE (DELTASONE) 1 MG tablet Take by mouth daily with breakfast. Patient has prednisone to take x 3 prior to procedure-unsure of dose-to tell nurse am of surgery-also will bring benadryl to take 1 hr prior to procedure    [provider]  ticagrelor (BRILINTA) 90 MG TABS tablet Take 45 mg by mouth daily.     Luanne Bras, MD     Family History  Problem Relation Age of Onset  . Seizures Mother   . Schizophrenia Father   . Cancer Paternal Grandfather     Social History   Social History  . Marital status: Married    Spouse name: elizabeth  . Number of children: 2  . Years of education: [name]   Occupational History  . [employer]    Social History Main Topics  . Smoking status: Former Smoker    Quit date: 05/01/1987  . Smokeless tobacco: Never Used     Comment: quit smoking 30 years ago 02/09/16  . Alcohol use Yes     Comment: occasional  . Drug use: No  . Sexual activity: Not on file   Other Topics Concern  . Not on file   Social History Narrative   Lives at home with wife    Caffeine use- coffee, 5 cups daily    Review of Systems  Constitutional: Negative for fatigue and fever.  Respiratory: Negative for cough and shortness of breath.   Cardiovascular: Negative for chest pain.  Psychiatric/Behavioral: Negative for behavioral problems and confusion.    Vital Signs: BP 140/83   Pulse 78   Temp 97.7 F (36.5 C) (Oral)   Resp 18   SpO2 97%   Physical Exam  Constitutional: He is oriented to person, place, and time. He appears well-developed.  Cardiovascular: Normal rate, regular rhythm and normal  heart sounds.   Pulmonary/Chest: Effort normal and breath sounds normal. No respiratory distress.  Neurological: He is alert and oriented to person, place, and time.  Skin: Skin is warm and dry.  Psychiatric: He has a normal mood and affect. His behavior is normal. Judgment and thought content normal.  Nursing note and vitals reviewed.   Mallampati Score:  MD Evaluation Airway: WNL Heart: WNL Abdomen: WNL Chest/ Lungs: WNL ASA  Classification: 3 Mallampati/Airway Score: Two  Imaging: No results found.  Labs:  CBC:  Recent Labs  08/24/16 0844 04/30/17 1028  WBC 12.1* 6.1  HGB 16.3 14.5  HCT 46.2 45.5  PLT 260 252    COAGS:  Recent Labs  08/24/16 0844  04/30/17 1028  INR 1.03 0.97  APTT 31 35    BMP:  Recent Labs  08/24/16 0844 04/30/17 1028  NA 135 139  K 4.3 4.3  CL 102 103  CO2 21* 27  GLUCOSE 263* 116*  BUN 12 9  CALCIUM 9.3 9.1  CREATININE 1.10 1.02  GFRNONAA >60 >60  GFRAA >60 >60    LIVER FUNCTION TESTS:  Recent Labs  04/30/17 1028  BILITOT 0.7  AST 24  ALT 24  ALKPHOS 57  PROT 7.0  ALBUMIN 4.1    TUMOR MARKERS: No results for input(s): AFPTM, CEA, CA199, CHROMGRNA in the last 8760 hours.  Assessment and Plan: Patient with past medical history of R MCA aneurysm presents for ongoing surveillance with angiogram and possible intervention today.  Patient presents today in their usual state of health.  He has been NPO and is not currently on blood thinners. He understands that he may be admitted overnight depending on whether Dr. Estanislado Pandy proceeds with intervention today. Risks and benefits discussed with the patient including, but not limited to bleeding, infection, vascular injury or contrast induced renal failure. All of the patient's questions were answered, patient is agreeable to proceed. Consent signed and in chart.   Thank you for this interesting consult.  I greatly enjoyed meeting Nicholas Wheeler and look forward to  participating in their care.  A copy of this report was sent to the requesting provider on this date.  Electronically Signed: Docia Barrier, PA 05/02/2017, 8:30 AM   I spent a total of    15 Minutes in face to face in clinical consultation, greater than 50% of which was counseling/coordinating care for R MCA aneurysm.

## 2017-05-02 NOTE — Progress Notes (Signed)
Anesthesia present for case 

## 2017-05-02 NOTE — Progress Notes (Signed)
Per IR PA, [name], will not start A-line until diagnostic completed.   CRNA, Ariel, made aware.

## 2017-05-02 NOTE — Anesthesia Postprocedure Evaluation (Signed)
Anesthesia Post Note  Patient: Nicholas Wheeler  Procedure(s) Performed: Procedure(s) (LRB): RADIOLOGY WITH ANESTHESIA     EMBOLIZATION (N/A)     Patient location during evaluation: PACU Anesthesia Type: MAC Level of consciousness: awake and alert Pain management: pain level controlled Vital Signs Assessment: post-procedure vital signs reviewed and stable Respiratory status: spontaneous breathing, nonlabored ventilation and respiratory function stable Cardiovascular status: stable and blood pressure returned to baseline Anesthetic complications: no    Last Vitals: There were no vitals filed for this visit.  Last Pain: There were no vitals filed for this visit.               Audry Pili

## 2017-05-02 NOTE — Progress Notes (Signed)
PA called by bedside RN.  Patient with headache not improved with Vicodin 1.5 hrs ago.  Discussed with Dr. Estanislado Pandy who approves 25 mg Fentanyl.  RN to call if headache persists.  Brynda Greathouse, MS RD PA-C 1:22 PM

## 2017-05-02 NOTE — Transfer of Care (Signed)
Immediate Anesthesia Transfer of Care Note  Patient: Nicholas Wheeler  Procedure(s) Performed: Procedure(s): RADIOLOGY WITH ANESTHESIA     EMBOLIZATION (N/A)  Patient Location: Short Stay  Anesthesia Type:MAC  Level of Consciousness: awake, alert , oriented, patient cooperative and responds to stimulation  Airway & Oxygen Therapy: Patient Spontanous Breathing  Post-op Assessment: Report given to RN, Post -op Vital signs reviewed and stable and Patient moving all extremities X 4  Post vital signs: Reviewed and stable  Last Vitals: There were no vitals filed for this visit.  Last Pain: There were no vitals filed for this visit.    Patients Stated Pain Goal: 3 (00/71/21 9758)  Complications: No apparent anesthesia complications

## 2017-05-02 NOTE — H&P (Deleted)
  The note originally documented on this encounter has been moved the the encounter in which it belongs.  

## 2017-05-02 NOTE — Procedures (Signed)
S/P RT common carotid arteriogram RT CFA approach. Findings. 1.RT MCA bifurcation aneurysm 3.39mm x3.92mm(decreased in size.

## 2017-05-03 ENCOUNTER — Encounter (HOSPITAL_COMMUNITY): Payer: Self-pay | Admitting: Interventional Radiology

## 2017-06-01 ENCOUNTER — Telehealth: Payer: Self-pay | Admitting: Radiology

## 2017-06-01 NOTE — Progress Notes (Signed)
  Note from scheduler saying pt has called experiencing migraine like headaches and blurred vision  I have called both numbers I have for pt NO answer  LM to come to ED for evaluation if still experiencing symptoms

## 2017-06-04 DIAGNOSIS — R1031 Right lower quadrant pain: Secondary | ICD-10-CM | POA: Diagnosis not present

## 2017-06-04 DIAGNOSIS — R1013 Epigastric pain: Secondary | ICD-10-CM | POA: Diagnosis not present

## 2017-06-04 DIAGNOSIS — H539 Unspecified visual disturbance: Secondary | ICD-10-CM | POA: Diagnosis not present

## 2017-06-04 DIAGNOSIS — N50811 Right testicular pain: Secondary | ICD-10-CM | POA: Diagnosis not present

## 2017-06-06 ENCOUNTER — Other Ambulatory Visit: Payer: Self-pay | Admitting: Family Medicine

## 2017-06-06 DIAGNOSIS — R1031 Right lower quadrant pain: Secondary | ICD-10-CM

## 2017-06-06 DIAGNOSIS — N50811 Right testicular pain: Secondary | ICD-10-CM

## 2017-06-07 ENCOUNTER — Ambulatory Visit
Admission: RE | Admit: 2017-06-07 | Discharge: 2017-06-07 | Disposition: A | Discharge status: Home / Self Care | Payer: BLUE CROSS/BLUE SHIELD | Source: Ambulatory Visit | Attending: Family Medicine | Admitting: Family Medicine

## 2017-06-07 ENCOUNTER — Other Ambulatory Visit: Payer: BLUE CROSS/BLUE SHIELD

## 2017-06-07 DIAGNOSIS — R1031 Right lower quadrant pain: Secondary | ICD-10-CM | POA: Diagnosis not present

## 2017-06-07 DIAGNOSIS — N433 Hydrocele, unspecified: Secondary | ICD-10-CM | POA: Diagnosis not present

## 2017-06-07 DIAGNOSIS — N50811 Right testicular pain: Secondary | ICD-10-CM

## 2017-06-18 DIAGNOSIS — E1140 Type 2 diabetes mellitus with diabetic neuropathy, unspecified: Secondary | ICD-10-CM | POA: Diagnosis not present

## 2017-06-18 DIAGNOSIS — E1165 Type 2 diabetes mellitus with hyperglycemia: Secondary | ICD-10-CM | POA: Diagnosis not present

## 2017-06-18 DIAGNOSIS — I671 Cerebral aneurysm, nonruptured: Secondary | ICD-10-CM | POA: Diagnosis not present

## 2017-06-18 DIAGNOSIS — J329 Chronic sinusitis, unspecified: Secondary | ICD-10-CM | POA: Diagnosis not present

## 2017-06-20 DIAGNOSIS — J322 Chronic ethmoidal sinusitis: Secondary | ICD-10-CM | POA: Diagnosis not present

## 2017-06-20 DIAGNOSIS — J342 Deviated nasal septum: Secondary | ICD-10-CM | POA: Diagnosis not present

## 2017-06-20 DIAGNOSIS — J301 Allergic rhinitis due to pollen: Secondary | ICD-10-CM | POA: Diagnosis not present

## 2017-06-20 DIAGNOSIS — J320 Chronic maxillary sinusitis: Secondary | ICD-10-CM | POA: Diagnosis not present

## 2017-06-21 ENCOUNTER — Other Ambulatory Visit (HOSPITAL_COMMUNITY): Payer: Self-pay | Admitting: Otolaryngology

## 2017-06-21 DIAGNOSIS — J322 Chronic ethmoidal sinusitis: Secondary | ICD-10-CM

## 2017-06-25 ENCOUNTER — Ambulatory Visit (HOSPITAL_COMMUNITY)
Admission: RE | Admit: 2017-06-25 | Discharge: 2017-06-25 | Disposition: A | Discharge status: Home / Self Care | Payer: BLUE CROSS/BLUE SHIELD | Source: Ambulatory Visit | Attending: Otolaryngology | Admitting: Otolaryngology

## 2017-06-25 DIAGNOSIS — J322 Chronic ethmoidal sinusitis: Secondary | ICD-10-CM

## 2017-06-25 DIAGNOSIS — J342 Deviated nasal septum: Secondary | ICD-10-CM | POA: Insufficient documentation

## 2017-06-25 DIAGNOSIS — J0100 Acute maxillary sinusitis, unspecified: Secondary | ICD-10-CM | POA: Diagnosis not present

## 2017-06-27 DIAGNOSIS — H6521 Chronic serous otitis media, right ear: Secondary | ICD-10-CM | POA: Diagnosis not present

## 2017-06-27 DIAGNOSIS — J323 Chronic sphenoidal sinusitis: Secondary | ICD-10-CM | POA: Diagnosis not present

## 2017-07-05 ENCOUNTER — Ambulatory Visit: Payer: BLUE CROSS/BLUE SHIELD | Admitting: Allergy & Immunology

## 2017-07-09 DIAGNOSIS — G4733 Obstructive sleep apnea (adult) (pediatric): Secondary | ICD-10-CM | POA: Diagnosis not present

## 2017-08-09 ENCOUNTER — Ambulatory Visit: Payer: BLUE CROSS/BLUE SHIELD | Admitting: Allergy & Immunology

## 2017-09-03 DIAGNOSIS — J0101 Acute recurrent maxillary sinusitis: Secondary | ICD-10-CM | POA: Diagnosis not present

## 2017-09-12 DIAGNOSIS — G43709 Chronic migraine without aura, not intractable, without status migrainosus: Secondary | ICD-10-CM | POA: Diagnosis not present

## 2017-09-18 DIAGNOSIS — J301 Allergic rhinitis due to pollen: Secondary | ICD-10-CM | POA: Diagnosis not present

## 2017-09-18 DIAGNOSIS — J3089 Other allergic rhinitis: Secondary | ICD-10-CM | POA: Diagnosis not present

## 2017-09-18 DIAGNOSIS — J342 Deviated nasal septum: Secondary | ICD-10-CM | POA: Diagnosis not present

## 2017-09-18 DIAGNOSIS — J343 Hypertrophy of nasal turbinates: Secondary | ICD-10-CM | POA: Diagnosis not present

## 2017-09-25 DIAGNOSIS — J322 Chronic ethmoidal sinusitis: Secondary | ICD-10-CM | POA: Diagnosis not present

## 2017-09-25 DIAGNOSIS — J320 Chronic maxillary sinusitis: Secondary | ICD-10-CM | POA: Diagnosis not present

## 2017-10-09 ENCOUNTER — Telehealth (HOSPITAL_COMMUNITY): Payer: Self-pay

## 2017-10-09 ENCOUNTER — Other Ambulatory Visit (HOSPITAL_COMMUNITY): Payer: Self-pay | Admitting: Interventional Radiology

## 2017-10-09 DIAGNOSIS — I729 Aneurysm of unspecified site: Secondary | ICD-10-CM

## 2017-10-09 NOTE — Telephone Encounter (Signed)
Called to schedule mri f/u, no answer, left vm. AW

## 2017-10-22 ENCOUNTER — Ambulatory Visit (HOSPITAL_COMMUNITY): Payer: BLUE CROSS/BLUE SHIELD

## 2017-10-22 ENCOUNTER — Ambulatory Visit (HOSPITAL_COMMUNITY)
Admission: RE | Admit: 2017-10-22 | Discharge: 2017-10-22 | Disposition: A | Discharge status: Home / Self Care | Payer: BLUE CROSS/BLUE SHIELD | Source: Ambulatory Visit | Attending: Interventional Radiology | Admitting: Interventional Radiology

## 2017-10-22 ENCOUNTER — Encounter (HOSPITAL_COMMUNITY): Payer: Self-pay

## 2017-10-22 DIAGNOSIS — I671 Cerebral aneurysm, nonruptured: Secondary | ICD-10-CM | POA: Diagnosis not present

## 2017-10-22 DIAGNOSIS — I729 Aneurysm of unspecified site: Secondary | ICD-10-CM

## 2017-10-22 LAB — CREATININE, SERUM
Creatinine, Ser: 1 mg/dL (ref 0.61–1.24)
GFR calc Af Amer: >60 mL/min (ref >60)

## 2017-10-22 MED ORDER — GADOBENATE DIMEGLUMINE 529 MG/ML IV SOLN
20.0000 mL | Freq: Once | INTRAVENOUS | Status: AC
  Administered 2017-10-22: 20 mL via INTRAVENOUS

## 2017-10-26 DIAGNOSIS — G4733 Obstructive sleep apnea (adult) (pediatric): Secondary | ICD-10-CM | POA: Diagnosis not present

## 2017-11-01 ENCOUNTER — Ambulatory Visit
Admission: RE | Admit: 2017-11-01 | Discharge: 2017-11-01 | Disposition: A | Discharge status: Home / Self Care | Payer: BLUE CROSS/BLUE SHIELD | Source: Ambulatory Visit | Attending: Family Medicine | Admitting: Family Medicine

## 2017-11-01 ENCOUNTER — Other Ambulatory Visit: Payer: Self-pay | Admitting: Family Medicine

## 2017-11-01 DIAGNOSIS — M25511 Pain in right shoulder: Secondary | ICD-10-CM | POA: Diagnosis not present

## 2017-11-21 ENCOUNTER — Other Ambulatory Visit (HOSPITAL_COMMUNITY): Payer: Self-pay | Admitting: Interventional Radiology

## 2017-11-21 DIAGNOSIS — I729 Aneurysm of unspecified site: Secondary | ICD-10-CM

## 2017-11-29 ENCOUNTER — Ambulatory Visit (HOSPITAL_COMMUNITY)
Admission: RE | Admit: 2017-11-29 | Discharge: 2017-11-29 | Disposition: A | Discharge status: Home / Self Care | Payer: BLUE CROSS/BLUE SHIELD | Source: Ambulatory Visit | Attending: Interventional Radiology | Admitting: Interventional Radiology

## 2017-11-29 DIAGNOSIS — I729 Aneurysm of unspecified site: Secondary | ICD-10-CM

## 2017-11-29 DIAGNOSIS — I671 Cerebral aneurysm, nonruptured: Secondary | ICD-10-CM | POA: Diagnosis not present

## 2017-11-29 HISTORY — PX: IR RADIOLOGIST EVAL & MGMT: IMG5224

## 2017-11-30 ENCOUNTER — Encounter (HOSPITAL_COMMUNITY): Payer: Self-pay | Admitting: Interventional Radiology

## 2017-12-17 DIAGNOSIS — E1140 Type 2 diabetes mellitus with diabetic neuropathy, unspecified: Secondary | ICD-10-CM | POA: Diagnosis not present

## 2017-12-17 DIAGNOSIS — E782 Mixed hyperlipidemia: Secondary | ICD-10-CM | POA: Diagnosis not present

## 2017-12-17 DIAGNOSIS — Z1211 Encounter for screening for malignant neoplasm of colon: Secondary | ICD-10-CM | POA: Diagnosis not present

## 2017-12-17 DIAGNOSIS — N50812 Left testicular pain: Secondary | ICD-10-CM | POA: Diagnosis not present

## 2017-12-17 DIAGNOSIS — R0989 Other specified symptoms and signs involving the circulatory and respiratory systems: Secondary | ICD-10-CM | POA: Diagnosis not present

## 2017-12-17 DIAGNOSIS — Z0000 Encounter for general adult medical examination without abnormal findings: Secondary | ICD-10-CM | POA: Diagnosis not present

## 2018-01-04 ENCOUNTER — Telehealth (HOSPITAL_COMMUNITY): Payer: Self-pay | Admitting: Radiology

## 2018-01-04 ENCOUNTER — Other Ambulatory Visit: Payer: Self-pay

## 2018-01-04 ENCOUNTER — Encounter (HOSPITAL_COMMUNITY): Payer: Self-pay | Admitting: *Deleted

## 2018-01-04 NOTE — Telephone Encounter (Signed)
Called pt to discuss his upcoming shoulder surgery on 01/07/18 as well as his upcoming aneurysm tx with Deveshwar. Pt will call us after his shoulder surgery and wants to set up a consult with Deveshwar prior to proceeding with tx. He has questions. JM

## 2018-01-06 NOTE — H&P (Addendum)
Nicholas Wheeler is an 57 y.o. male.    Chief Complaint: right shoulder pain  HPI: Pt is a 57 y.o. male complaining of right shoulder pain for multiple months. Pain had continually increased since the beginning. X-rays in the clinic show cuff tear right shoulder. Pt has tried various conservative treatments which have failed to alleviate their symptoms, including injections and therapy. Various options are discussed with the patient. Risks, benefits and expectations were discussed with the patient. Patient understand the risks, benefits and expectations and wishes to proceed with surgery.   PCP:  Antony Contras, MD  D/C Plans: Home  PMH: Past Medical History:  Diagnosis Date  . Aneurysm ([place])   . Arthritis   . AV block 2010   caused by verapamil--given for migraines no problems since   . Complication of anesthesia    "woke up violently after last arteriogram " ?  . Diabetes ([place])    Type II  . Diabetic neuropathy ([place])   . GERD (gastroesophageal reflux disease)   . History of hiatal hernia   . Hypersomnia with sleep apnea, unspecified 09/08/2013  . Joint disorder    degenerated  . Migraine   . Migraine   . Obesity (BMI 35.0-39.9 without comorbidity)   . Recurrent sinus infections   . Skin cancer   . Sleep apnea    saw Dr Brett Fairy in 09/2013; wears CPAP set at 14  . TMJ (temporomandibular joint disorder)   . Wears glasses     PSH: Past Surgical History:  Procedure Laterality Date  . CARPAL TUNNEL RELEASE Right   . COLONOSCOPY W/ BIOPSIES AND POLYPECTOMY    . IR ANGIO EXTRACRAN SEL COM CAROTID INNOMINATE UNI R MOD SED  05/02/2017  . IR GENERIC HISTORICAL  04/24/2016   IR TRANSCATH/EMBOLIZ 04/24/2016 Luanne Bras, MD [facility]  . IR GENERIC HISTORICAL  04/24/2016   IR ANGIOGRAM FOLLOW UP STUDY 04/24/2016 Luanne Bras, MD [facility]  . IR GENERIC HISTORICAL  04/24/2016   IR NEURO EACH ADD'L AFTER BASIC UNI RIGHT (MS) 04/24/2016 Luanne Bras, MD [facility]  [facility]  . IR GENERIC HISTORICAL  04/24/2016   IR ANGIO INTRA EXTRACRAN SEL INTERNAL CAROTID UNI R MOD SED 04/24/2016 Luanne Bras, MD [facility]  . IR GENERIC HISTORICAL  05/19/2016   IR RADIOLOGIST EVAL & MGMT 05/19/2016 [facility]  . IR GENERIC HISTORICAL  08/24/2016   IR ANGIO VERTEBRAL SEL SUBCLAVIAN INNOMINATE UNI R MOD SED 08/24/2016 Luanne Bras, MD [facility]  . IR GENERIC HISTORICAL  08/24/2016   IR ANGIO INTRA EXTRACRAN SEL COM CAROTID INNOMINATE BILAT MOD SED 08/24/2016 Luanne Bras, MD [facility]  . IR RADIOLOGIST EVAL & MGMT  11/29/2017  . KNEE ARTHROSCOPY Right   . LUMBAR DISC SURGERY    . MOHS SURGERY     basal cell face  . RADIOLOGY WITH ANESTHESIA N/A 04/24/2016   Procedure: EMBOLIZATION;  Surgeon: Luanne Bras, MD;  Location: [place];  Service: Radiology;  Laterality: N/A;  . RADIOLOGY WITH ANESTHESIA N/A 05/02/2017   Procedure: RADIOLOGY WITH ANESTHESIA     EMBOLIZATION;  Surgeon: Luanne Bras, MD;  Location: [place];  Service: Radiology;  Laterality: N/A;  . TONSILLECTOMY      Social History:  reports that he quit smoking about 30 years ago. He has never used smokeless tobacco. He reports that he drank alcohol. He reports that he does not use drugs.  Allergies:  Allergies  Allergen Reactions  . Clopidogrel Other (See Comments)    "  bleeding and chest burning" Burning chest and eye started bleeding  . Keflex [Cephalexin] Anaphylaxis and Swelling    Throat swelling  . Ivp Dye [Iodinated Diagnostic Agents] Hives    Dye used in the 80s   . Verapamil Palpitations    AV BLOCK  . Glipizide Palpitations and Other (See Comments)    "Makes heart feel funny"     Medications: No current facility-administered medications for this encounter.    Current Outpatient Medications  Medication Sig Dispense Refill  . aspirin 81 MG tablet Take 81 mg by mouth daily.     . calcium carbonate (TUMS - DOSED IN MG ELEMENTAL CALCIUM) 500 MG chewable tablet  Chew 1 tablet by mouth daily as needed for indigestion or heartburn.    . cetirizine (ZYRTEC) 10 MG tablet Take 10 mg by mouth daily.    . dapagliflozin propanediol (FARXIGA) 10 MG TABS tablet Take 10 mg by mouth every morning.    Eduard Roux (AIMOVIG [place]) Inject into the skin. monthly    . metFORMIN (GLUCOPHAGE) 1000 MG tablet Take 1,000 mg by mouth 2 (two) times daily with a meal.    . simethicone (MYLICON) 948 MG chewable tablet Chew 125 mg by mouth every 6 (six) hours as needed for flatulence.    . meloxicam (MOBIC) 15 MG tablet Take 15 mg by mouth daily.  0   Facility-Administered Medications Ordered in Other Encounters  Medication Dose Route Frequency Provider Last Rate Last Dose  . gadopentetate dimeglumine (MAGNEVIST) injection 20 mL  20 mL Intravenous Once PRN Penumalli, Vikram R, MD        No results found for this or any previous visit (from the past 69 hour(s)). No results found.  ROS: Pain with rom of the  Right upper xtremity  Physical Exam:  Alert and oriented 57 y.o. male in no acute distress Cranial nerves 2-12 intact Cervical spine: full rom with no tenderness, nv intact distally Chest: active breath sounds bilaterally, no wheeze rhonchi or rales Heart: regular rate and rhythm, no murmur Abd: non tender non distended with active bowel sounds Hip is stable with rom  Right shoulder with painful rom nv intact distally No rashes or edema Weakness with ER and IR  Assessment/Plan Assessment: right rotator cuff tear  Plan: Patient will undergo a right rotator cuff repair by Dr. Veverly Fells at [facility]. Risks benefits and expectations were discussed with the patient. Patient understand risks, benefits and expectations and wishes to proceed.  Merla Riches PA-C, MPAS [facility] is now [employer] [address], [place], [place], [place] [address] Phone: [phone] [email or web address] Facebook  Instagram  LinkedIn   Twitter     There has been no change in the patient's clinical status.  The patient would like to proceed with shoulder surgery as scheduled.  Augustin Schooling, MD

## 2018-01-07 ENCOUNTER — Encounter (HOSPITAL_COMMUNITY)
Admission: RE | Disposition: A | Discharge status: Home / Self Care | Payer: Self-pay | Source: Ambulatory Visit | Attending: Orthopedic Surgery

## 2018-01-07 ENCOUNTER — Ambulatory Visit (HOSPITAL_COMMUNITY): Payer: No Typology Code available for payment source | Admitting: Certified Registered"

## 2018-01-07 ENCOUNTER — Ambulatory Visit (HOSPITAL_COMMUNITY)
Admission: RE | Admit: 2018-01-07 | Discharge: 2018-01-07 | Disposition: A | Discharge status: Home / Self Care | Payer: No Typology Code available for payment source | Source: Ambulatory Visit | Attending: Orthopedic Surgery | Admitting: Orthopedic Surgery

## 2018-01-07 DIAGNOSIS — Z91041 Radiographic dye allergy status: Secondary | ICD-10-CM | POA: Insufficient documentation

## 2018-01-07 DIAGNOSIS — E669 Obesity, unspecified: Secondary | ICD-10-CM | POA: Insufficient documentation

## 2018-01-07 DIAGNOSIS — Z881 Allergy status to other antibiotic agents status: Secondary | ICD-10-CM | POA: Diagnosis not present

## 2018-01-07 DIAGNOSIS — M1991 Primary osteoarthritis, unspecified site: Secondary | ICD-10-CM | POA: Diagnosis not present

## 2018-01-07 DIAGNOSIS — Z7984 Long term (current) use of oral hypoglycemic drugs: Secondary | ICD-10-CM | POA: Insufficient documentation

## 2018-01-07 DIAGNOSIS — M19011 Primary osteoarthritis, right shoulder: Secondary | ICD-10-CM | POA: Diagnosis not present

## 2018-01-07 DIAGNOSIS — K219 Gastro-esophageal reflux disease without esophagitis: Secondary | ICD-10-CM | POA: Insufficient documentation

## 2018-01-07 DIAGNOSIS — G4710 Hypersomnia, unspecified: Secondary | ICD-10-CM | POA: Diagnosis not present

## 2018-01-07 DIAGNOSIS — Z87891 Personal history of nicotine dependence: Secondary | ICD-10-CM | POA: Insufficient documentation

## 2018-01-07 DIAGNOSIS — E1140 Type 2 diabetes mellitus with diabetic neuropathy, unspecified: Secondary | ICD-10-CM | POA: Diagnosis not present

## 2018-01-07 DIAGNOSIS — G43909 Migraine, unspecified, not intractable, without status migrainosus: Secondary | ICD-10-CM | POA: Diagnosis not present

## 2018-01-07 DIAGNOSIS — S43431A Superior glenoid labrum lesion of right shoulder, initial encounter: Secondary | ICD-10-CM | POA: Diagnosis not present

## 2018-01-07 DIAGNOSIS — M75121 Complete rotator cuff tear or rupture of right shoulder, not specified as traumatic: Secondary | ICD-10-CM | POA: Diagnosis not present

## 2018-01-07 DIAGNOSIS — Z85828 Personal history of other malignant neoplasm of skin: Secondary | ICD-10-CM | POA: Diagnosis not present

## 2018-01-07 DIAGNOSIS — K449 Diaphragmatic hernia without obstruction or gangrene: Secondary | ICD-10-CM | POA: Diagnosis not present

## 2018-01-07 DIAGNOSIS — Z888 Allergy status to other drugs, medicaments and biological substances status: Secondary | ICD-10-CM | POA: Insufficient documentation

## 2018-01-07 DIAGNOSIS — I729 Aneurysm of unspecified site: Secondary | ICD-10-CM | POA: Diagnosis not present

## 2018-01-07 DIAGNOSIS — G4730 Sleep apnea, unspecified: Secondary | ICD-10-CM | POA: Diagnosis not present

## 2018-01-07 DIAGNOSIS — I4430 Unspecified atrioventricular block: Secondary | ICD-10-CM | POA: Insufficient documentation

## 2018-01-07 DIAGNOSIS — Z79899 Other long term (current) drug therapy: Secondary | ICD-10-CM | POA: Diagnosis not present

## 2018-01-07 DIAGNOSIS — M26609 Unspecified temporomandibular joint disorder, unspecified side: Secondary | ICD-10-CM | POA: Insufficient documentation

## 2018-01-07 DIAGNOSIS — Z7982 Long term (current) use of aspirin: Secondary | ICD-10-CM | POA: Insufficient documentation

## 2018-01-07 DIAGNOSIS — Z6836 Body mass index (BMI) 36.0-36.9, adult: Secondary | ICD-10-CM | POA: Insufficient documentation

## 2018-01-07 DIAGNOSIS — M75101 Unspecified rotator cuff tear or rupture of right shoulder, not specified as traumatic: Secondary | ICD-10-CM | POA: Diagnosis present

## 2018-01-07 DIAGNOSIS — X58XXXA Exposure to other specified factors, initial encounter: Secondary | ICD-10-CM | POA: Insufficient documentation

## 2018-01-07 HISTORY — PX: ARTHOSCOPIC ROTAOR CUFF REPAIR: SHX5002

## 2018-01-07 LAB — CBC
HCT: 47.6 % (ref 39.0–52.0)
Hemoglobin: 16.8 g/dL (ref 13.0–17.0)
MCH: 29.2 pg (ref 26.0–34.0)
MCHC: 35.3 g/dL (ref 30.0–36.0)
MCV: 82.6 fL (ref 78.0–100.0)
PLATELETS: 228 10*3/uL (ref 150–400)
RBC: 5.76 MIL/uL (ref 4.22–5.81)
RDW: 13 % (ref 11.5–15.5)
WBC: 8.9 10*3/uL (ref 4.0–10.5)

## 2018-01-07 LAB — BASIC METABOLIC PANEL
ANION GAP: 11 (ref 5–15)
BUN: 9 mg/dL (ref 6–20)
CALCIUM: 9.3 mg/dL (ref 8.9–10.3)
CO2: 23 mmol/L (ref 22–32)
CREATININE: 0.93 mg/dL (ref 0.61–1.24)
Chloride: 104 mmol/L (ref 101–111)
GFR calc Af Amer: >60 mL/min (ref >60)
GFR calc non Af Amer: >60 mL/min (ref >60)
GLUCOSE: 137 mg/dL — AB (ref 65–99)
POTASSIUM: 4.2 mmol/L (ref 3.5–5.1)
SODIUM: 138 mmol/L (ref 135–145)

## 2018-01-07 LAB — GLUCOSE, CAPILLARY: Glucose-Capillary: 163 mg/dL — ABNORMAL HIGH (ref 65–99)

## 2018-01-07 SURGERY — REPAIR, ROTATOR CUFF, ARTHROSCOPIC
Anesthesia: Regional | Site: Shoulder | Laterality: Right

## 2018-01-07 MED ORDER — SODIUM CHLORIDE 0.9 % IR SOLN
Status: DC | PRN
  Administered 2018-01-07 (×2): 3000 mL

## 2018-01-07 MED ORDER — KETOROLAC TROMETHAMINE 30 MG/ML IJ SOLN
INTRAMUSCULAR | Status: AC
  Filled 2018-01-07: qty 1

## 2018-01-07 MED ORDER — LACTATED RINGERS IV SOLN
INTRAVENOUS | Status: DC
  Administered 2018-01-07: 13:00:00 via INTRAVENOUS
  Administered 2018-01-07: 50 mL/h via INTRAVENOUS

## 2018-01-07 MED ORDER — EPHEDRINE SULFATE-NACL 50-0.9 MG/10ML-% IV SOSY
PREFILLED_SYRINGE | INTRAVENOUS | Status: DC | PRN
  Administered 2018-01-07 (×4): 5 mg via INTRAVENOUS

## 2018-01-07 MED ORDER — ARTIFICIAL TEARS OPHTHALMIC OINT
TOPICAL_OINTMENT | OPHTHALMIC | Status: AC
  Filled 2018-01-07: qty 3.5

## 2018-01-07 MED ORDER — SUGAMMADEX SODIUM 500 MG/5ML IV SOLN
INTRAVENOUS | Status: DC | PRN
  Administered 2018-01-07: 208.6 mg via INTRAVENOUS

## 2018-01-07 MED ORDER — DEXMEDETOMIDINE HCL IN NACL 200 MCG/50ML IV SOLN
INTRAVENOUS | Status: DC | PRN
  Administered 2018-01-07 (×5): 8 ug via INTRAVENOUS

## 2018-01-07 MED ORDER — LIDOCAINE 2% (20 MG/ML) 5 ML SYRINGE
INTRAMUSCULAR | Status: DC | PRN
  Administered 2018-01-07: 40 mg via INTRAVENOUS

## 2018-01-07 MED ORDER — FENTANYL CITRATE (PF) 250 MCG/5ML IJ SOLN
INTRAMUSCULAR | Status: AC
  Filled 2018-01-07: qty 5

## 2018-01-07 MED ORDER — PROPOFOL 10 MG/ML IV BOLUS
INTRAVENOUS | Status: AC
  Filled 2018-01-07: qty 20

## 2018-01-07 MED ORDER — OXYCODONE HCL 5 MG PO TABS: 5.0000 mg | ORAL_TABLET | Freq: Once | ORAL | Status: DC | PRN

## 2018-01-07 MED ORDER — MIDAZOLAM HCL 2 MG/2ML IJ SOLN
INTRAMUSCULAR | Status: AC
  Filled 2018-01-07: qty 2

## 2018-01-07 MED ORDER — OXYCODONE HCL 5 MG/5ML PO SOLN: 5.0000 mg | Freq: Once | ORAL | Status: DC | PRN

## 2018-01-07 MED ORDER — HYDROMORPHONE HCL 2 MG/ML IJ SOLN: 0.3000 mg | INTRAMUSCULAR | Status: DC | PRN

## 2018-01-07 MED ORDER — PHENYLEPHRINE HCL 10 MG/ML IJ SOLN
INTRAVENOUS | Status: DC | PRN
  Administered 2018-01-07: 25 ug/min via INTRAVENOUS

## 2018-01-07 MED ORDER — FENTANYL CITRATE (PF) 100 MCG/2ML IJ SOLN
INTRAMUSCULAR | Status: DC | PRN
  Administered 2018-01-07: 50 ug via INTRAVENOUS
  Administered 2018-01-07: 100 ug via INTRAVENOUS
  Administered 2018-01-07: 50 ug via INTRAVENOUS

## 2018-01-07 MED ORDER — PROMETHAZINE HCL 25 MG/ML IJ SOLN: 6.2500 mg | INTRAMUSCULAR | Status: DC | PRN

## 2018-01-07 MED ORDER — DEXAMETHASONE SODIUM PHOSPHATE 10 MG/ML IJ SOLN
INTRAMUSCULAR | Status: AC
  Filled 2018-01-07: qty 1

## 2018-01-07 MED ORDER — DEXAMETHASONE SODIUM PHOSPHATE 10 MG/ML IJ SOLN
INTRAMUSCULAR | Status: DC | PRN
  Administered 2018-01-07: 5 mg via INTRAVENOUS

## 2018-01-07 MED ORDER — ROCURONIUM BROMIDE 10 MG/ML (PF) SYRINGE
PREFILLED_SYRINGE | INTRAVENOUS | Status: DC | PRN
  Administered 2018-01-07: 50 mg via INTRAVENOUS

## 2018-01-07 MED ORDER — ONDANSETRON HCL 4 MG/2ML IJ SOLN
INTRAMUSCULAR | Status: DC | PRN
  Administered 2018-01-07: 4 mg via INTRAVENOUS

## 2018-01-07 MED ORDER — CHLORHEXIDINE GLUCONATE 4 % EX LIQD: 60.0000 mL | Freq: Once | CUTANEOUS | Status: DC

## 2018-01-07 MED ORDER — BUPIVACAINE-EPINEPHRINE 0.25% -1:200000 IJ SOLN
INTRAMUSCULAR | Status: DC | PRN
  Administered 2018-01-07: 10 mL

## 2018-01-07 MED ORDER — EPHEDRINE 5 MG/ML INJ
INTRAVENOUS | Status: AC
  Filled 2018-01-07: qty 10

## 2018-01-07 MED ORDER — CLINDAMYCIN PHOSPHATE 900 MG/50ML IV SOLN
INTRAVENOUS | Status: AC
  Filled 2018-01-07: qty 50

## 2018-01-07 MED ORDER — FENTANYL CITRATE (PF) 100 MCG/2ML IJ SOLN
INTRAMUSCULAR | Status: AC
  Filled 2018-01-07: qty 2

## 2018-01-07 MED ORDER — OXYCODONE-ACETAMINOPHEN 5-325 MG PO TABS: 1.0000 | ORAL_TABLET | ORAL | 0 refills | Status: DC | PRN

## 2018-01-07 MED ORDER — METHOCARBAMOL 500 MG PO TABS: 500.0000 mg | ORAL_TABLET | Freq: Three times a day (TID) | ORAL | 1 refills | Status: DC | PRN

## 2018-01-07 MED ORDER — ONDANSETRON HCL 4 MG/2ML IJ SOLN
INTRAMUSCULAR | Status: AC
  Filled 2018-01-07: qty 2

## 2018-01-07 MED ORDER — KETOROLAC TROMETHAMINE 30 MG/ML IJ SOLN
INTRAMUSCULAR | Status: DC | PRN
  Administered 2018-01-07: 30 mg via INTRAVENOUS

## 2018-01-07 MED ORDER — BUPIVACAINE-EPINEPHRINE (PF) 0.25% -1:200000 IJ SOLN
INTRAMUSCULAR | Status: AC
  Filled 2018-01-07: qty 30

## 2018-01-07 MED ORDER — ROCURONIUM BROMIDE 50 MG/5ML IV SOLN
INTRAVENOUS | Status: AC
  Filled 2018-01-07: qty 1

## 2018-01-07 MED ORDER — PROPOFOL 10 MG/ML IV BOLUS
INTRAVENOUS | Status: DC | PRN
Start: 2018-01-07 — End: 2018-01-07
  Administered 2018-01-07: 150 mg via INTRAVENOUS
  Administered 2018-01-07: 50 mg via INTRAVENOUS

## 2018-01-07 MED ORDER — 0.9 % SODIUM CHLORIDE (POUR BTL) OPTIME
TOPICAL | Status: DC | PRN
  Administered 2018-01-07: 1000 mL

## 2018-01-07 MED ORDER — CLINDAMYCIN PHOSPHATE 900 MG/50ML IV SOLN
900.0000 mg | INTRAVENOUS | Status: AC
  Administered 2018-01-07: 900 mg via INTRAVENOUS

## 2018-01-07 MED ORDER — SUGAMMADEX SODIUM 500 MG/5ML IV SOLN
INTRAVENOUS | Status: AC
  Filled 2018-01-07: qty 5

## 2018-01-07 MED ORDER — ROPIVACAINE HCL 7.5 MG/ML IJ SOLN
INTRAMUSCULAR | Status: DC | PRN
  Administered 2018-01-07: 20 mL via PERINEURAL

## 2018-01-07 MED ORDER — MIDAZOLAM HCL 5 MG/5ML IJ SOLN
INTRAMUSCULAR | Status: DC | PRN
  Administered 2018-01-07 (×2): 1 mg via INTRAVENOUS

## 2018-01-07 SURGICAL SUPPLY — 68 items
ANCHOR ALL SUT RC W2 1.3 RIB (Anchor) ×2 IMPLANT
ANCHOR ALL-SUT FLEX 1.3 Y-KNOT (Anchor) ×2 IMPLANT
ANCHOR ALL-SUT RC W2 1.3 RIB (Anchor) ×2 IMPLANT
BIT DRILL YKNOT HARD BONE 1.3 (BIT) ×2 IMPLANT
BLADE LONG MED 31X9 (MISCELLANEOUS) IMPLANT
BLADE SURG 11 STRL SS (BLADE) ×2 IMPLANT
BLADE W/14.0X25.5MM (BLADE) ×2 IMPLANT
BUR OVAL 4.0 (BURR) IMPLANT
COVER SURGICAL LIGHT HANDLE (MISCELLANEOUS) ×2 IMPLANT
DRAPE INCISE IOBAN 66X45 STRL (DRAPES) ×2 IMPLANT
DRAPE ORTHO SPLIT 77X108 STRL (DRAPES) ×2
DRAPE STERI 35X30 U-POUCH (DRAPES) ×2 IMPLANT
DRAPE SURG ORHT 6 SPLT 77X108 (DRAPES) ×2 IMPLANT
DRAPE U-SHAPE 47X51 STRL (DRAPES) ×2 IMPLANT
DRSG ADAPTIC 3X8 NADH LF (GAUZE/BANDAGES/DRESSINGS) ×2 IMPLANT
DRSG EMULSION OIL 3X3 NADH (GAUZE/BANDAGES/DRESSINGS) ×4 IMPLANT
DRSG PAD ABDOMINAL 8X10 ST (GAUZE/BANDAGES/DRESSINGS) ×4 IMPLANT
DURAPREP 26ML APPLICATOR (WOUND CARE) ×2 IMPLANT
ELECT NEEDLE TIP 2.8 STRL (NEEDLE) ×2 IMPLANT
ELECT REM PT RETURN 9FT ADLT (ELECTROSURGICAL) ×2
ELECTRODE REM PT RTRN 9FT ADLT (ELECTROSURGICAL) ×1 IMPLANT
GAUZE SPONGE 4X4 12PLY STRL (GAUZE/BANDAGES/DRESSINGS) ×2 IMPLANT
GAUZE SPONGE 4X4 12PLY STRL LF (GAUZE/BANDAGES/DRESSINGS) ×2 IMPLANT
GLOVE BIOGEL PI ORTHO PRO 7.5 (GLOVE) ×1
GLOVE BIOGEL PI ORTHO PRO SZ8 (GLOVE) ×1
GLOVE ORTHO TXT STRL SZ7.5 (GLOVE) ×2 IMPLANT
GLOVE PI ORTHO PRO STRL 7.5 (GLOVE) ×1 IMPLANT
GLOVE PI ORTHO PRO STRL SZ8 (GLOVE) ×1 IMPLANT
GLOVE SURG ORTHO 8.5 STRL (GLOVE) ×4 IMPLANT
GOWN STRL REUS W/ TWL XL LVL3 (GOWN DISPOSABLE) ×3 IMPLANT
GOWN STRL REUS W/TWL XL LVL3 (GOWN DISPOSABLE) ×3
KIT BASIN OR (CUSTOM PROCEDURE TRAY) ×2 IMPLANT
KIT TURNOVER KIT B (KITS) ×2 IMPLANT
MANIFOLD NEPTUNE II (INSTRUMENTS) ×2 IMPLANT
NDL SUT .5 MAYO 1.404X.05X (NEEDLE) ×1 IMPLANT
NDL SUT 6 .5 CRC .975X.05 MAYO (NEEDLE) ×3 IMPLANT
NEEDLE HYPO 25GX1X1/2 BEV (NEEDLE) ×2 IMPLANT
NEEDLE MAYO TAPER (NEEDLE) ×4
NEEDLE SPNL 18GX3.5 QUINCKE PK (NEEDLE) ×2 IMPLANT
NS IRRIG 1000ML POUR BTL (IV SOLUTION) ×2 IMPLANT
PACK SHOULDER (CUSTOM PROCEDURE TRAY) ×2 IMPLANT
PAD ABD 8X10 STRL (GAUZE/BANDAGES/DRESSINGS) ×4 IMPLANT
PAD ARMBOARD 7.5X6 YLW CONV (MISCELLANEOUS) ×4 IMPLANT
PORT APPOLLO RF 90DEGREE MULTI (SURGICAL WAND) ×2 IMPLANT
RESECTOR FULL RADIUS 4.2MM (BLADE) ×2 IMPLANT
SET ARTHROSCOPY TUBING (MISCELLANEOUS) ×1
SET ARTHROSCOPY TUBING LN (MISCELLANEOUS) ×1 IMPLANT
SLING ARM FOAM STRAP LRG (SOFTGOODS) ×2 IMPLANT
SLING ARM FOAM STRAP MED (SOFTGOODS) IMPLANT
SPONGE LAP 4X18 RFD (DISPOSABLE) ×2 IMPLANT
STRIP CLOSURE SKIN 1/2X4 (GAUZE/BANDAGES/DRESSINGS) ×2 IMPLANT
SUT BONE WAX W31G (SUTURE) ×2 IMPLANT
SUT FIBERWIRE #2 38 T-5 BLUE (SUTURE)
SUT HI-FI 2 STRAND C-2 40 (SUTURE) ×4 IMPLANT
SUT MNCRL AB 3-0 PS2 18 (SUTURE) ×2 IMPLANT
SUT VIC AB 0 CT1 27 (SUTURE) ×1
SUT VIC AB 0 CT1 27XBRD ANBCTR (SUTURE) ×1 IMPLANT
SUT VIC AB 0 CT2 27 (SUTURE) ×4 IMPLANT
SUT VIC AB 2-0 CT1 27 (SUTURE) ×1
SUT VIC AB 2-0 CT1 TAPERPNT 27 (SUTURE) ×1 IMPLANT
SUTURE FIBERWR #2 38 T-5 BLUE (SUTURE) IMPLANT
SYR CONTROL 10ML LL (SYRINGE) ×2 IMPLANT
TAPE CLOTH SURG 6X10 WHT LF (GAUZE/BANDAGES/DRESSINGS) ×2 IMPLANT
TOWEL OR 17X24 6PK STRL BLUE (TOWEL DISPOSABLE) ×2 IMPLANT
TOWEL OR 17X26 10 PK STRL BLUE (TOWEL DISPOSABLE) ×2 IMPLANT
TUBE CONNECTING 12X1/4 (SUCTIONS) ×2 IMPLANT
WAND HAND CNTRL MULTIVAC 90 (MISCELLANEOUS) IMPLANT
WATER STERILE IRR 1000ML POUR (IV SOLUTION) ×2 IMPLANT

## 2018-01-07 NOTE — Transfer of Care (Signed)
Immediate Anesthesia Transfer of Care Note  Patient: Nicholas Wheeler  Procedure(s) Performed: RIGHT SHOULDER ARTHROSCOPY, SUBACROMIAL DECOMPRESSION, OPEN ROTATOR CUFF REPAIR, OPEN DISTAL CLAVICLE EXCISION, BICEPS TENODESIS, SLAP DEBRIDMENT (Right Shoulder)  Patient Location: PACU  Anesthesia Type:GA combined with regional for post-op pain  Level of Consciousness: drowsy and patient cooperative  Airway & Oxygen Therapy: Patient Spontanous Breathing and Patient connected to nasal cannula oxygen  Post-op Assessment: Report given to RN and Post -op Vital signs reviewed and stable  Post vital signs: Reviewed and stable  Last Vitals:  Vitals Value Taken Time  BP 131/82 01/07/2018  1:57 PM  Temp    Pulse 97 01/07/2018  1:58 PM  Resp 27 01/07/2018  1:58 PM  SpO2 93 % 01/07/2018  1:58 PM  Vitals shown include unvalidated device data.  Last Pain:  Vitals:   01/07/18 0931  TempSrc: Oral         Complications: No apparent anesthesia complications

## 2018-01-07 NOTE — Interval H&P Note (Signed)
History and Physical Interval Note:  01/07/2018 10:42 AM  Nicholas Wheeler  has presented today for surgery, with the diagnosis of Right shoulder rotator cuff tear, acromioclavicular degenerative joint disease  The various methods of treatment have been discussed with the patient and family. After consideration of risks, benefits and other options for treatment, the patient has consented to  Procedure(s): ARTHROSCOPIC ROTATOR CUFF REPAIR (Right) as a surgical intervention .  The patient's history has been reviewed, patient examined, no change in status, stable for surgery.  I have reviewed the patient's chart and labs.  Questions were answered to the patient's satisfaction.     Kalan Yeley,STEVEN R

## 2018-01-07 NOTE — Discharge Instructions (Signed)
Ice to the shoulder constantly.  Start exercises today, do them every hour for 5 minutes - Pendulums, lap slides or chair arm slides, rotation exercises - hug and hitchhike  Use the pillow sling at all times while up and moving around and out of the home.  Ok to lean to the right to remove your sling while at home and then "hug a pillow." Always keep the arm propped away from your body to rest the surgical repair.  Keep the right shoulder incision covered and clean and dry for one week then ok to get it wet in the shower.  Follow up with Dr Veverly Fells in the office in two weeks, call [phone] for appt.

## 2018-01-07 NOTE — Anesthesia Postprocedure Evaluation (Signed)
Anesthesia Post Note  Patient: Nicholas Wheeler  Procedure(s) Performed: RIGHT SHOULDER ARTHROSCOPY, SUBACROMIAL DECOMPRESSION, OPEN ROTATOR CUFF REPAIR, OPEN DISTAL CLAVICLE EXCISION, BICEPS TENODESIS, SLAP DEBRIDMENT (Right Shoulder)     Patient location during evaluation: PACU Anesthesia Type: Regional and General Level of consciousness: awake and alert Pain management: pain level controlled Vital Signs Assessment: post-procedure vital signs reviewed and stable Respiratory status: spontaneous breathing, nonlabored ventilation, respiratory function stable and patient connected to nasal cannula oxygen Cardiovascular status: blood pressure returned to baseline and stable Postop Assessment: no apparent nausea or vomiting Anesthetic complications: no    Last Vitals:  Vitals:   01/07/18 1512 01/07/18 1515  BP: 116/74   Pulse: 91   Resp: 15   Temp:  36.4 C  SpO2: 94%     Last Pain:  Vitals:   01/07/18 1500  TempSrc:   PainSc: 0-No pain                 Ailsa Mireles P Nalleli Largent

## 2018-01-07 NOTE — Brief Op Note (Signed)
01/07/2018  2:03 PM  PATIENT:  Nicholas Wheeler  57 y.o. male  PRE-OPERATIVE DIAGNOSIS:  Right shoulder rotator cuff tear, acromioclavicular degenerative joint disease  POST-OPERATIVE DIAGNOSIS:  Right shoulder rotator cuff tear, acromioclavicular degenerative joint disease, SLAP tear  PROCEDURE:  Procedure(s): RIGHT SHOULDER ARTHROSCOPY, SUBACROMIAL DECOMPRESSION, OPEN ROTATOR CUFF REPAIR, OPEN DISTAL CLAVICLE EXCISION, BICEPS TENODESIS, SLAP DEBRIDMENT (Right)  SURGEON:  Surgeon(s) and Role:    Netta Cedars, MD - Primary  PHYSICIAN ASSISTANT:   ASSISTANTS: Ventura Bruns, PA-C   ANESTHESIA:   regional and general  EBL:  75 mL   BLOOD ADMINISTERED:none  DRAINS: none   LOCAL MEDICATIONS USED:  MARCAINE     SPECIMEN:  No Specimen  DISPOSITION OF SPECIMEN:  N/A  COUNTS:  YES  TOURNIQUET:  * No tourniquets in log *  DICTATION: .Other Dictation: Dictation Number [id number]  PLAN OF CARE: Discharge to home after PACU  PATIENT DISPOSITION:  PACU - hemodynamically stable.   Delay start of Pharmacological VTE agent (>24hrs) due to surgical blood loss or risk of bleeding: not applicable

## 2018-01-07 NOTE — Anesthesia Procedure Notes (Signed)
Anesthesia Regional Block: Interscalene brachial plexus block   Pre-Anesthetic Checklist: ,, timeout performed, Correct Patient, Correct Site, Correct Laterality, Correct Procedure,, site marked, risks and benefits discussed, Surgical consent,  Pre-op evaluation,  At surgeon's request and post-op pain management  Laterality: Right  Prep: chloraprep       Needles:  Injection technique: Single-shot  Needle Type: Echogenic Stimulator Needle     Needle Length: 9cm  Needle Gauge: 21     Additional Needles:   Procedures:,,,, ultrasound used (permanent image in chart),,,,  Narrative:  Start time: 01/07/2018 10:40 AM End time: 01/07/2018 10:50 AM Injection made incrementally with aspirations every 5 mL.  Performed by: Personally  Anesthesiologist: Murvin Natal, MD  Additional Notes: Functioning IV was confirmed and monitors were applied.  A 4mm 21ga Arrow echogenic stimulator needle was used. Sterile prep, hand hygiene and sterile gloves were used.  Negative aspiration and negative test dose prior to incremental administration of local anesthetic. The patient tolerated the procedure well.

## 2018-01-07 NOTE — Anesthesia Procedure Notes (Signed)
Procedure Name: Intubation Date/Time: 01/07/2018 11:22 AM Performed by: Colin Benton, CRNA Pre-anesthesia Checklist: Patient identified, Emergency Drugs available, Suction available and Patient being monitored Patient Re-evaluated:Patient Re-evaluated prior to induction Oxygen Delivery Method: Circle system utilized Preoxygenation: Pre-oxygenation with 100% oxygen Induction Type: IV induction Ventilation: Mask ventilation without difficulty and Oral airway inserted - appropriate to patient size Laryngoscope Size: Glidescope and 4 Grade View: Grade I Tube type: Oral Tube size: 7.5 mm Number of attempts: 3 Airway Equipment and Method: Rigid stylet and Video-laryngoscopy Placement Confirmation: ETT inserted through vocal cords under direct vision,  positive ETCO2 and breath sounds checked- equal and bilateral Secured at: 23 cm Tube secured with: Tape Dental Injury: Teeth and Oropharynx as per pre-operative assessment  Difficulty Due To: Difficulty was anticipated and Difficult Airway- due to large tongue Comments: DL with Mil 2 blade.  No view beyond epiglottis.  DL x 2 with MAC 3. No view beyond epiglottis.  Positive bmv with OA and VSS.  Glidescope 4 used and grade 1 view.

## 2018-01-07 NOTE — Anesthesia Preprocedure Evaluation (Addendum)
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 Visual snow negative psych ROS   GI/Hepatic Neg liver ROS, hiatal hernia, GERD  ,  Endo/Other  diabetes, Oral Hypoglycemic Agents  Renal/GU negative Renal ROS     Musculoskeletal  (+) Arthritis ,   Abdominal (+) + obese,   Peds  Hematology negative hematology ROS (+)   Anesthesia Other Findings Right shoulder rotator cuff tear, acromioclavicular degenerative joint disease  Reproductive/Obstetrics                            Anesthesia Physical Anesthesia Plan  ASA: III  Anesthesia Plan: General and Regional   Post-op Pain Management: GA combined w/ Regional for post-op pain   Induction: Intravenous  PONV Risk Score and Plan: 2 and Ondansetron, Dexamethasone, Midazolam and Treatment may vary due to age or medical condition  Airway Management Planned: Oral ETT  Additional Equipment:   Intra-op Plan:   Post-operative Plan: Extubation in OR  Informed Consent: I have reviewed the patients History and Physical, chart, labs and discussed the procedure including the risks, benefits and alternatives for the proposed anesthesia with the patient or authorized representative who has indicated his/her understanding and acceptance.   Dental advisory given  Plan Discussed with: CRNA  Anesthesia  Plan Comments:         Anesthesia Quick Evaluation

## 2018-01-07 NOTE — Op Note (Signed)
NAME: Nicholas Wheeler, Nicholas Wheeler MEDICAL RECORD ZO:[id number] ACCOUNT [id number] DATE OF BIRTH:[birth year] FACILITY: [facility] LOCATION: [facility] PHYSICIAN:STEVEN R. Ceaser Ebeling, MD  OPERATIVE REPORT  DATE OF PROCEDURE:  01/07/2018  PREOPERATIVE DIAGNOSIS:  Right shoulder rotator cuff tear, superior labrum anterior to posterior tear, acromioclavicular joint arthritis, symptomatic.  POSTOPERATIVE DIAGNOSIS:  Right shoulder rotator cuff tear, superior labrum anterior to posterior tear, acromioclavicular joint arthritis, symptomatic.  PROCEDURE PERFORMED:  Right shoulder arthroscopy with extensive intraarticular debridement of torn superior labrum anterior to posterior, followed by biceps tenotomy, followed by arthroscopic subacromial decompression with CA ligament release.  We did a  mini open rotator cuff repair and biceps tenodesis in the groove and then open distal clavicle resection.  SURGEON:  Esmond Plants, MD  ASSISTANT:  Darol Destine, [place], who was scrubbed the entire procedure and necessary for satisfactory completion of the surgery.  ANESTHESIA:  General anesthesia was used plus interscalene block.  ESTIMATED BLOOD LOSS:  Minimal.  FLUID REPLACEMENT:  1200 mL crystalloid.  COUNTS:  Instrument counts were correct.  COMPLICATIONS:  There were no complications.   Perioperative antibiotics were given.  INDICATIONS:  The patient is a 57 year old male with worsening right shoulder pain secondary to an MRI documented SLAP tear, rotator cuff tear and advanced AC arthritis with outlet impingement.  The patient has had progressive pain despite conservative  management and presents with a failure of conservative management, desiring surgical treatment for his shoulder to relieve pain and restore function.  Informed consent obtained.  DESCRIPTION OF PROCEDURE:  After an adequate level of anesthesia achieved, the patient was positioned in the modified beach chair position.  Right shoulder  correctly identified and sterilely prepped and draped in the usual manner.  Timeout was called.   We examined the patient's shoulder under anesthesia revealing full passive range of motion with no undue stiffness, no instability.  After completion of our exam under anesthesia and re-verification of the correct patient, correct site and timeout, we  then started our arthroscopic portion of the procedure.  We instituted anterior, posterior and lateral portals in the usual fashion, identifying significant tearing of the superior labrum and proximal biceps.  The biceps was shredded and almost torn away  from the superior labrum.  We went ahead and released the biceps tendon doing a biceps tenotomy and then a labral debridement with basket forceps and motorized shaver.  There was synovitis throughout the shoulder joint, which we debrided using the  shaver and also the Arthrocare unit to control bleeding.  Subscapularis looked great and then we noted there to be a full thickness tear of the supraspinatus extending into the infraspinatus.  The teres minor was intact, however, and the posterior  infraspinatus as well.  Posterior labrum looked pretty good.  Once we completed our labral debridement and biceps tenotomy, we placed the scope in the subacromial space.  A thorough bursectomy and acromioplasty was performed, creating a type 1 acromial  shape with a butcher block technique utilizing a high speed burr.  We did release the CA ligament.  We debrided all the way over to the [facility] joint, which was arthritic.  The rotator cuff was clearly torn with some retraction and some frayed tendon in the  subacromial space and quite a bit of intense bursitis there.  Once we had completed our decompression and inspection from the dorsal side of the rotator cuff, we concluded the arthroscopic portion of the surgery.  We then went ahead and did the open  portion of the  procedure.  We did a small saber incision overlying the AC  joint.  Dissection down through the subcutaneous tissues using needle tip Bovie.  We did subperiosteal dissection of the distal clavicle followed by excision of distal 2 mm of bone  using the oscillating saw.  We applied bone wax to the cut in the clavicle.  We made sure that the anterior and posterior AC ligaments were intact.  We then repaired the deltotrapezial fascia with interrupted 0 Vicryl suture figure-of-eight, followed by  2-0 Vicryl for subcutaneous closure and 4-0 Monocryl for skin.  We dressed the biceps and the rotator cuff through a mini open incision, starting at the anterolateral border of the acromion extending distally about 4 cm in the raphae between the  anterior and lateral heads of the deltoid.  We identified the bicipital sheath and delivered the biceps tendon out of the wound.  We whipstitched that with #2 FiberWire suture in a baseball stitch fashion.  We then prepared the floor of the biceps groove  with a needle tip Bovie and a Soil scientist and removed the soft tissue there to encourage the tendon to bone healing for the tenodesis.  We then placed a Y-Knot Flex anchor to the floor of the biceps group and brought that up through the reinforced  portion of the biceps tendon in a mattress fashion.  We then took our longitudinal baseball stitches up through the proximal portion of the biceps groove and tied over a soft tissue bridge with the subscapularis and the rotator interval.  We had a nice  low profile tenodesis.  We oversewed distal to the repair site with 0 Vicryl figure-of-eight x2 in the region of the pectoralis area incorporating that pec tendon.  Next, we addressed the rotator cuff tear.  This was a fairly significant tear involving  the entire supraspinatus and part of the infraspinatus.  We went ahead and freshened up the end of the tendon.  We prepared the greater tuberosity.  We placed #2 Hi-Fi suture x4 in the free edge of the tendon.  We then took two Y-Knot RC  anchors double  loaded with ribbon and placed those at the articular margin, 1 more posteriorly and 1 more anteriorly.  We brought the ribbon suture up through the medial portion of the footprint of the rotator cuff to reestablish that for the middle row and then we  took the free edge lateral sutures down through drill holes in the bone and tied over the greater tuberosity for the lateral row.  We also reinforced the rotator interval with side-to-side suturing through a combination of the medial and lateral rows  with strong mattress suture technique and finishing laterally with tying down over the tendon, coming up through the tendon and going down through bone and tying around the greater tuberosity, so a very low profile repair, watertight and ranged  everything and there was no impingement.  We irrigated thoroughly and then closed the deltoid with 0 Vicryl suture anatomically, followed by 2-0 Vicryl for subcutaneous closure and 4-0 Monocryl for skin.  Steri-Strips were applied followed by a sterile  dressing.  The patient tolerated surgery well.  GN/NUANCE  D:01/07/2018 T:01/07/2018 JOB:[id number]

## 2018-01-08 ENCOUNTER — Encounter (HOSPITAL_COMMUNITY): Payer: Self-pay | Admitting: Orthopedic Surgery

## 2018-02-07 DIAGNOSIS — G4733 Obstructive sleep apnea (adult) (pediatric): Secondary | ICD-10-CM | POA: Diagnosis not present

## 2018-02-22 IMAGING — CT CT MAXILLOFACIAL W/O CM
3 series · 16 of 47 positions shown, 19 images · non-contrast
Comparison: None.

CLINICAL DATA: Chronic left-sided sinusitis over the last 6 months.
History of basal cell carcinoma of the face.

EXAM:
CT MAXILLOFACIAL WITHOUT CONTRAST
TECHNIQUE: Multidetector CT imaging of the maxillofacial structures was
performed. Multiplanar CT image reconstructions were also generated.
A small metallic BB was placed on the right temple in order to
reliably differentiate right from left.

[Series 3: facialbone 2.0 st · axial · 0.41mm/px · z∈[-254,-104]mm · 10 of 87 slices shown, 13 images]
[im 6/87  brain]
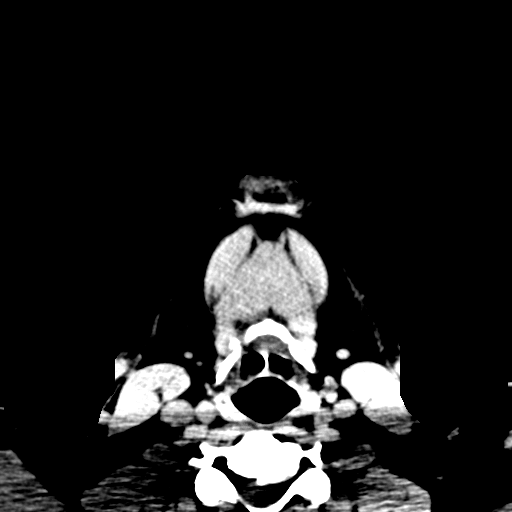
[im 6/87  bone]
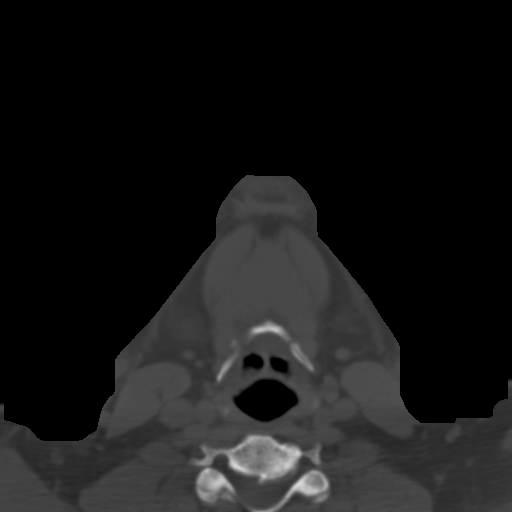
[im 15/87  bone]
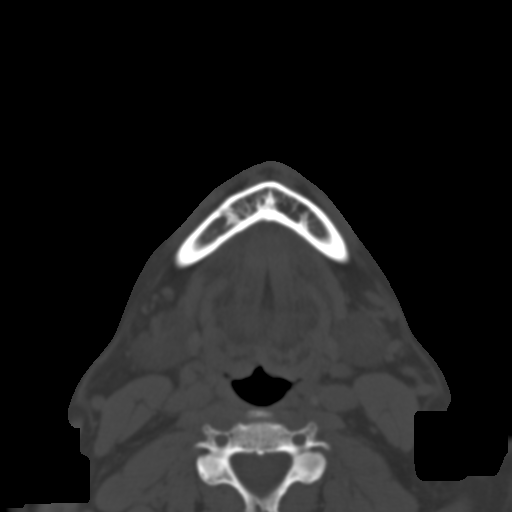
[im 24/87  bone]
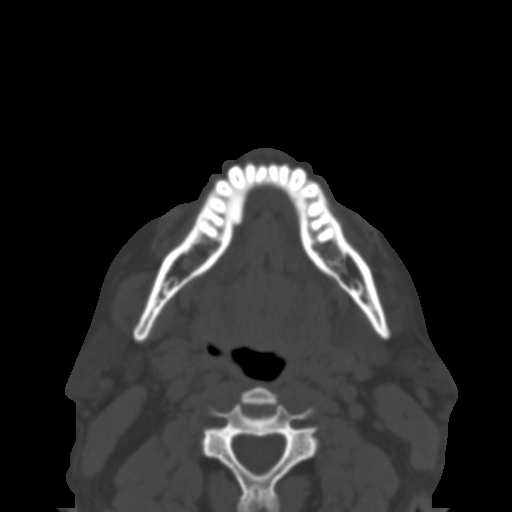
[im 30/87  bone]
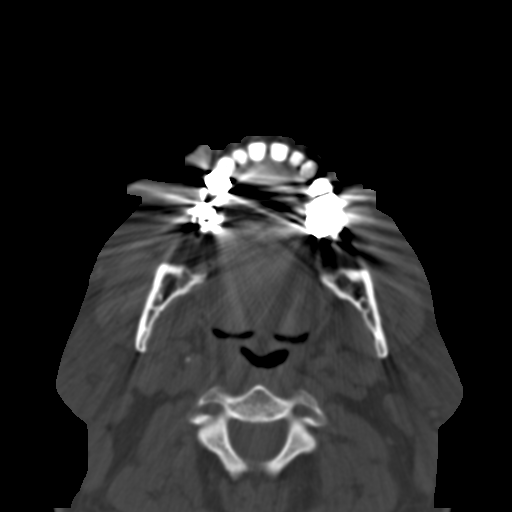
[im 39/87  brain]
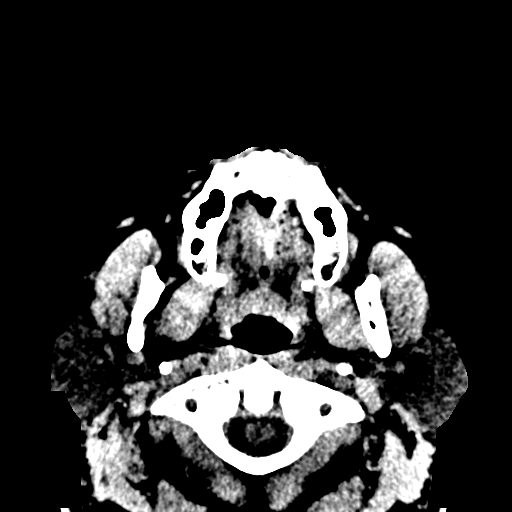
[im 39/87  bone]
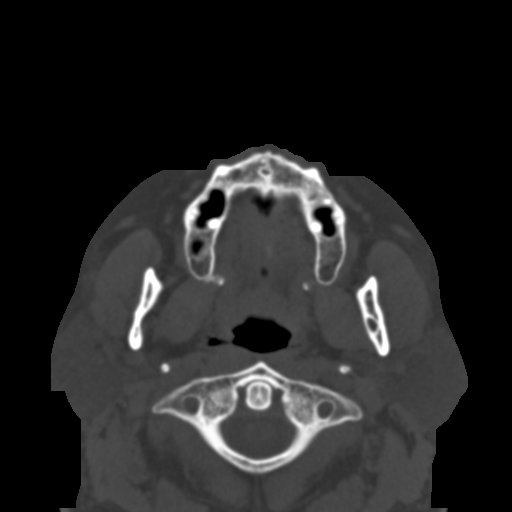
[im 48/87  bone]
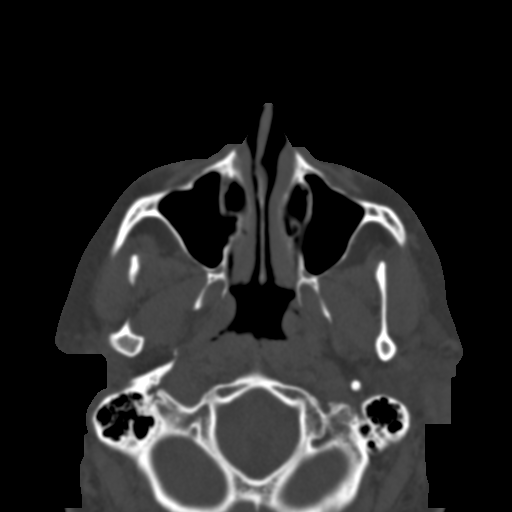
[im 57/87  bone]
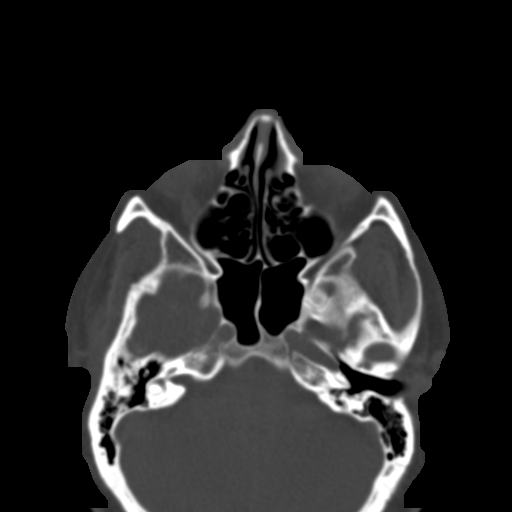
[im 66/87  bone]
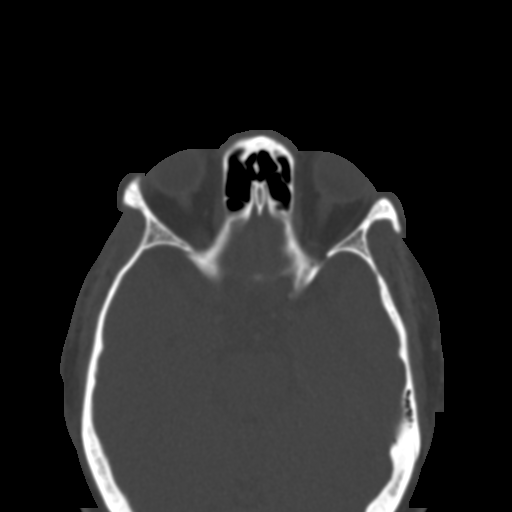
[im 72/87  brain]
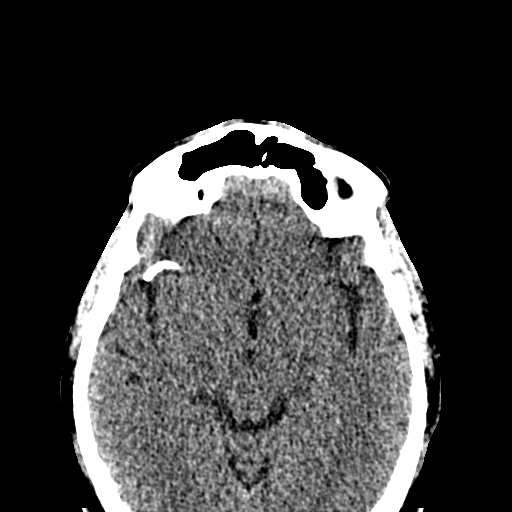
[im 72/87  bone]
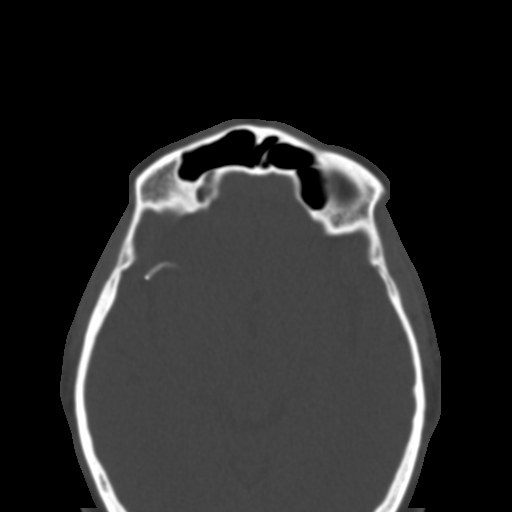
[im 81/87  bone]
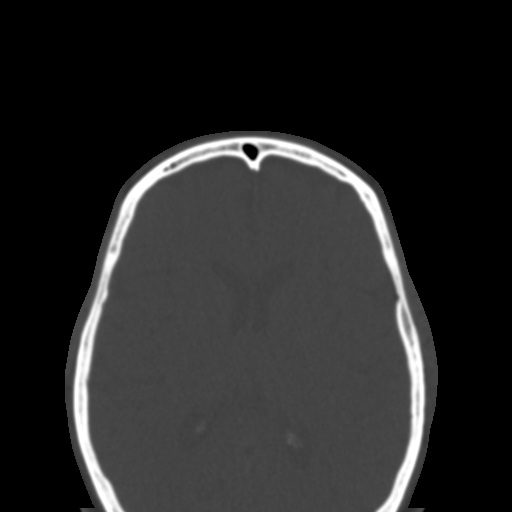

[Series 7: facialbone 2.0 cor st · coronal · 0.37mm/px · 3 of 91 slices shown]
[im 31/91  bone]
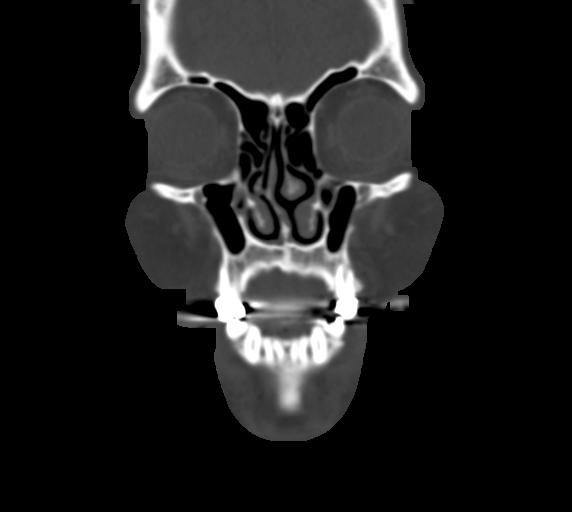
[im 41/91  bone]
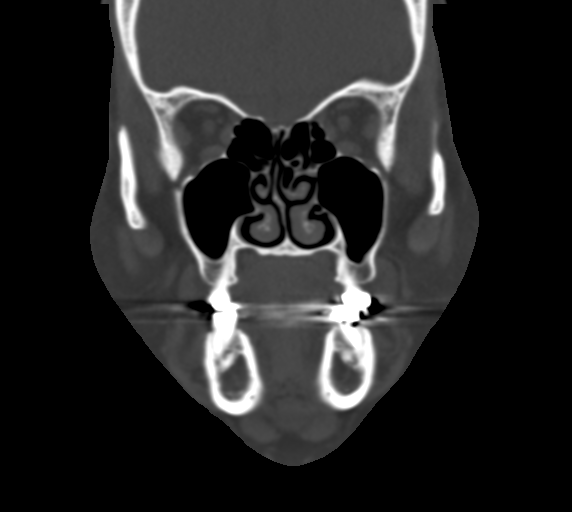
[im 51/91  bone]
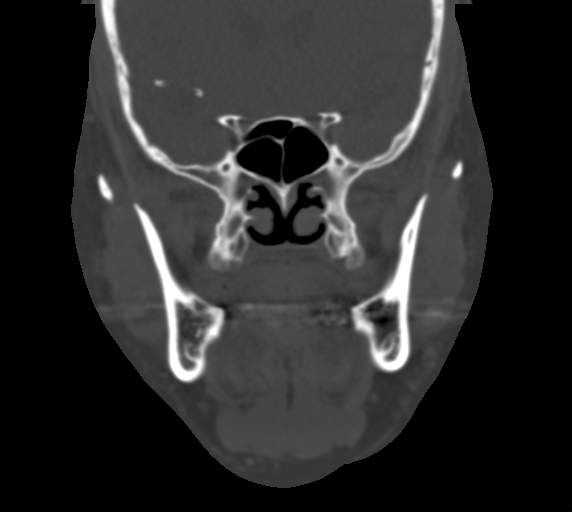

[Series 8: facialbone 2.0 sag st · sagittal · 0.36mm/px · 3 of 106 slices shown]
[im 36/106  bone]
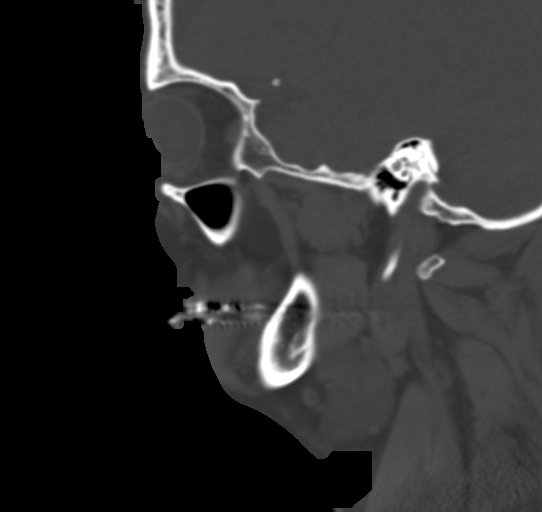
[im 53/106  bone]
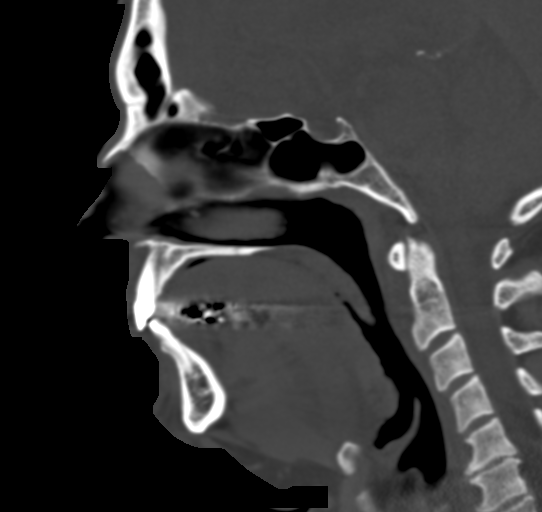
[im 71/106  bone]
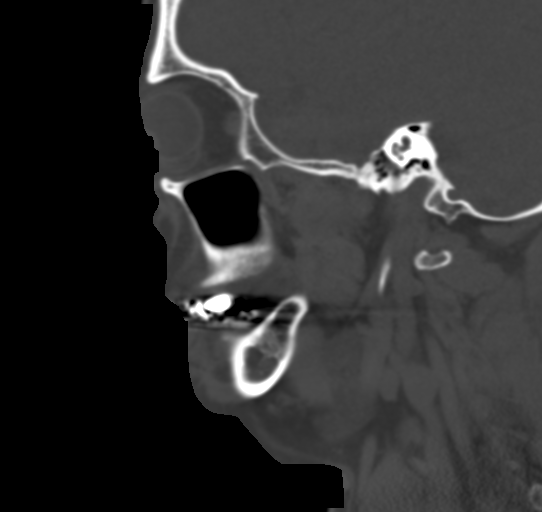

[16 of 47 positions shown; findings below may reference images not displayed]

FINDINGS: Frontal sinuses:  Clear

Ethmoid sinuses:  Clear

Sphenoid sinus:  Clear

Maxillary sinuses:  Clear

Middle ears and mastoids:  Clear and normal.

No superficial facial soft tissue abnormality is seen.

Orbits are normal.

No primary osseous lesion.

Intracranial stent in the right MCA.

Ostiomeatal complexes are normal bilaterally with widely patent
infundibulae. Bilateral Haller cells cause slight horizontal course
of the infundibulae, but they are not obstructed.
IMPRESSION: Negative examination. No evidence of chronic or acute sinusitis. No
significant anatomic variants.

## 2018-03-14 NOTE — Telephone Encounter (Signed)
Attempted phone call

## 2018-04-06 IMAGING — XA IR ANGIO INTRA EXTRACRAN SEL COM CAROTID INNOMINATE BILAT MOD SE
1 series · 1 of 1 positions shown · IV contrast (IODINE)
Comparison: none

CLINICAL DATA: Recurring headaches. History of known right MCA
region aneurysm.

[Series 5: carotid · 1 of 1 slices shown]
[im 1/1]
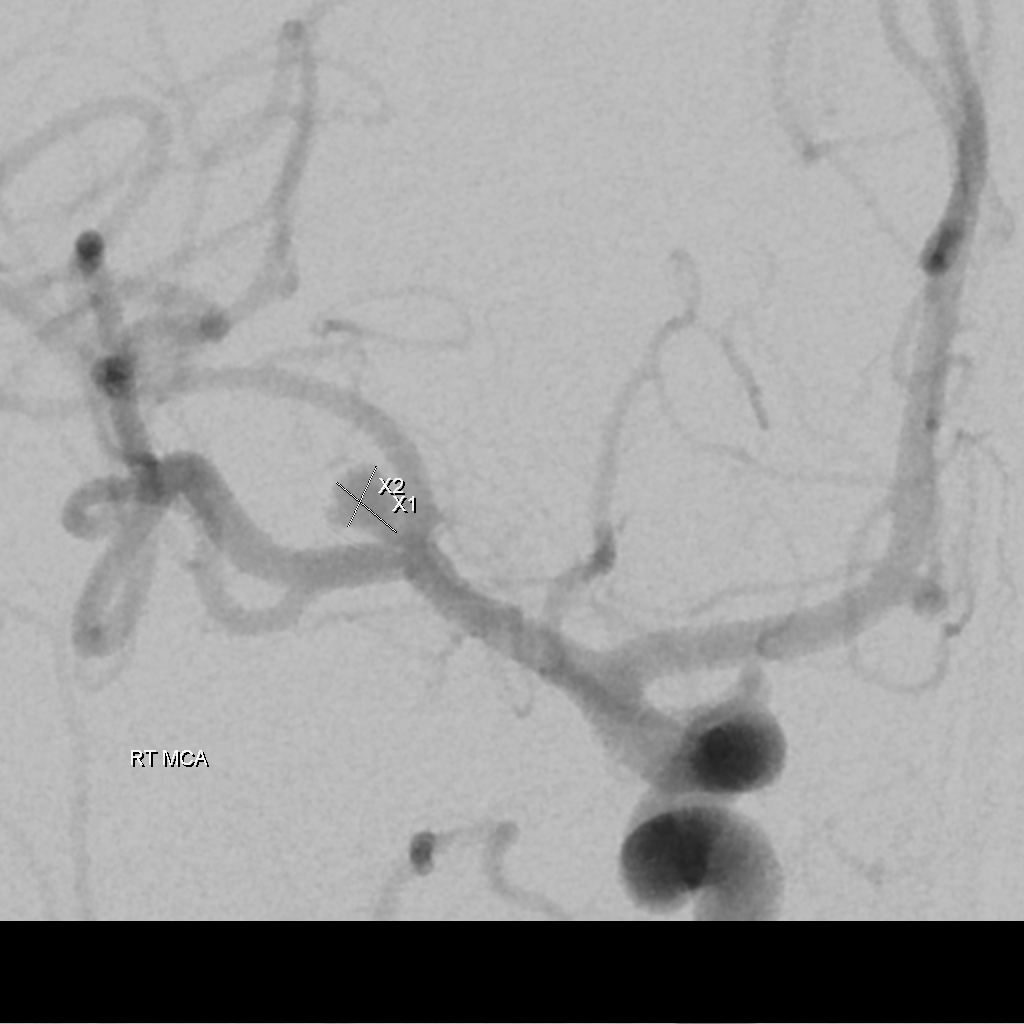

[1 of 1 positions shown; findings below may reference images not displayed]

EXAM:
BILATERAL COMMON CAROTID AND INNOMINATE ANGIOGRAPHY AND BILATERAL
VERTEBRAL ARTERY ANGIOGRAMS

PROCEDURE:
Contrast: Isovue 300 approximately 35 mL.

Anesthesia/Sedation:  Conscious sedation.

Medications: Versed 1 mg IV. Fentanyl 25 mcg IV. Heparin 0333 units
IV.

Following a full explanation of the procedure along with the
potential associated complications, an informed witnessed consent
was obtained.

The right groin was prepped and draped in the usual sterile fashion.
Thereafter using modified Seldinger technique, transfemoral access
into the right common femoral artery was obtained without
difficulty. Over a 0.035 inch guidewire, a 5 French Pinnacle sheath
was inserted. Through this, and also over 0.035 inch guidewire, a 5
French JB 1 catheter was advanced to the aortic arch region and
selectively positioned in the right common carotid artery, the right
subclavian artery, the left common carotid artery.

There were no acute complications. The patient tolerated the
procedure well.
FINDINGS: The right vertebral artery origin is normal.

The vessel is seen to opacify normally to the cranial skull base.

Wide patency is seen in the right vertebrobasilar junction with
opacification noted into the proximal basilar artery. The right
common carotid arteriogram demonstrates the right external carotid
artery and its major branches to be widely patent.

The right internal carotid artery at the bulb and its proximal
one-third is widely patent.

There is a U shaped configuration of the mid cervical right ICA. No
evidence of kinking is seen.

More distally, the vessel is seen to opacify to the cranial skull
base. The petrous, the cavernous and the supraclinoid segments are
widely patent.

The right middle cerebral artery and the right anterior cerebral
artery opacify normally into the capillary and venous phases.

Again demonstrated is the bilobed saccular aneurysm arising between
the origins of the superior and the inferior divisions of the right
middle cerebral artery with a previously positioned [employer] Josh[employer]. stent
extending from the mid right M1 segment into the superior division
of the right middle cerebral artery. The stent itself appears stable
and widely patent.

The aneurysm today measures approximately 4 mm x 3.4 mm.

Transient opacification via the anterior communicating artery of the
left anterior cerebral A2 segment and distally is seen.

The left common carotid arteriogram demonstrates the left external
carotid artery and its major branches to be widely patent.

The left internal carotid artery at the bulb to the cranial skull
base also demonstrates wide patency.

There is mild tortuosity mid cervical left ICA.

The petrous, the cavernous and the supraclinoid segments are widely
patent.

A left posterior communicating artery is seen opacifying the left
posterior cerebral artery distribution.

The left middle cerebral artery and the left anterior cerebral
artery opacify normally into the capillary and the venous phases.

Also seen is a cross transient filling of the right anterior
cerebral artery A2 segment via the anterior communicating artery.

Again demonstrated is a hypoplastic left transverse sinus probably
on a developmental basis.
IMPRESSION: Approximately 4 mm x 3.4 mm right middle cerebral artery bifurcation
bilobed aneurysm. Compared to the previous studies this aneurysm
demonstrates narrowing of the neck of the aneurysm following the
placement of an [employer] Joshjax. stent as described. The dimensions,
otherwise, remain unchanged.

Wide patency of the stented portion of the left middle cerebral
artery into the superior division as described above.

## 2018-05-09 DIAGNOSIS — G4733 Obstructive sleep apnea (adult) (pediatric): Secondary | ICD-10-CM | POA: Diagnosis not present

## 2018-05-14 DIAGNOSIS — G43709 Chronic migraine without aura, not intractable, without status migrainosus: Secondary | ICD-10-CM | POA: Diagnosis not present

## 2018-06-21 DIAGNOSIS — G43909 Migraine, unspecified, not intractable, without status migrainosus: Secondary | ICD-10-CM | POA: Diagnosis not present

## 2018-06-21 DIAGNOSIS — E1140 Type 2 diabetes mellitus with diabetic neuropathy, unspecified: Secondary | ICD-10-CM | POA: Diagnosis not present

## 2018-06-21 DIAGNOSIS — E782 Mixed hyperlipidemia: Secondary | ICD-10-CM | POA: Diagnosis not present

## 2018-06-21 DIAGNOSIS — I671 Cerebral aneurysm, nonruptured: Secondary | ICD-10-CM | POA: Diagnosis not present

## 2018-10-03 ENCOUNTER — Other Ambulatory Visit (HOSPITAL_COMMUNITY): Payer: Self-pay | Admitting: Interventional Radiology

## 2018-10-03 DIAGNOSIS — I729 Aneurysm of unspecified site: Secondary | ICD-10-CM

## 2018-10-17 ENCOUNTER — Ambulatory Visit (HOSPITAL_COMMUNITY)
Admission: RE | Admit: 2018-10-17 | Discharge: 2018-10-17 | Disposition: A | Discharge status: Home / Self Care | Payer: 59 | Source: Ambulatory Visit | Attending: Interventional Radiology | Admitting: Interventional Radiology

## 2018-10-17 DIAGNOSIS — I729 Aneurysm of unspecified site: Secondary | ICD-10-CM | POA: Insufficient documentation

## 2018-10-21 ENCOUNTER — Other Ambulatory Visit (HOSPITAL_COMMUNITY): Payer: Self-pay | Admitting: Interventional Radiology

## 2018-10-21 DIAGNOSIS — I729 Aneurysm of unspecified site: Secondary | ICD-10-CM

## 2018-10-28 ENCOUNTER — Ambulatory Visit (HOSPITAL_COMMUNITY)
Admission: RE | Admit: 2018-10-28 | Discharge: 2018-10-28 | Disposition: A | Discharge status: Home / Self Care | Payer: BLUE CROSS/BLUE SHIELD | Source: Ambulatory Visit | Attending: Interventional Radiology | Admitting: Interventional Radiology

## 2018-10-28 DIAGNOSIS — I729 Aneurysm of unspecified site: Secondary | ICD-10-CM

## 2018-10-28 NOTE — Progress Notes (Addendum)
Chief Complaint: Patient was seen in consultation today for right MCA trifurcation aneurysm s/p embolization.  Referring Physician(s): Glo Herring  Supervising Physician: Luanne Bras  Patient Status: [facility] - Out-pt  History of Present Illness: Nicholas Wheeler is a 58 y.o. male with a past medical history as below, with pertinent past medical history including AV block, migraines, diabetes mellitus type II with associated diabetic neuropathy, obesity, and sleep apnea on CPAP. He is known to [facility] and has been followed by Dr. Estanislado Pandy since 12/2015. He first presented to our department at the request of Dr. Gerarda Fraction for management of an intracranial aneurysm. 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 Estanislado Pandy. He has been followed since with routine imaging scans and diagnostic cerebral arteriograms.  MRA head 10/17/2018: 1. Stable residual 3 x 2 mm RIGHT MCA aneurysm neck filling. No change from 2019 MRA. Slight decreased from 2017 catheter angiogram.  Patient presents today to discuss findings of his recent MRA head 10/17/2018. Patient awake and alert sitting in chair. Accompanied by wife. Complains of dizziness associated with changing positions, stable at this time. Denies syncope associated with dizziness. Complains of bilateral blurred vision, improved since last consultation. Denies headache, weakness, numbness/tingling, diplopia/curtain over eyes/blindness, hearing changes, tinnitus, or speech difficulty.  Patient is currently taking Aspirin 81 mg once daily.   Past Medical History:  Diagnosis Date  . Aneurysm ([place])   . Arthritis   . AV block 2010   caused by verapamil--given for migraines no problems since   . Complication of anesthesia    "woke up violently after last arteriogram " ?  . Diabetes ([place])    Type II  . Diabetic neuropathy ([place])   . GERD  (gastroesophageal reflux disease)   . History of hiatal hernia   . Hypersomnia with sleep apnea, unspecified 09/08/2013  . Joint disorder    degenerated  . Migraine   . Migraine   . Obesity (BMI 35.0-39.9 without comorbidity)   . Recurrent sinus infections   . Skin cancer   . Sleep apnea    saw Dr Brett Fairy in 09/2013; wears CPAP set at 14  . TMJ (temporomandibular joint disorder)   . Wears glasses     Past Surgical History:  Procedure Laterality Date  . ARTHOSCOPIC ROTAOR CUFF REPAIR Right 01/07/2018   Procedure: RIGHT SHOULDER ARTHROSCOPY, SUBACROMIAL DECOMPRESSION, OPEN ROTATOR CUFF REPAIR, OPEN DISTAL CLAVICLE EXCISION, BICEPS TENODESIS, SLAP DEBRIDMENT;  Surgeon: Netta Cedars, MD;  Location: [facility];  Service: Orthopedics;  Laterality: Right;  . CARPAL TUNNEL RELEASE Right   . COLONOSCOPY W/ BIOPSIES AND POLYPECTOMY    . IR ANGIO EXTRACRAN SEL COM CAROTID INNOMINATE UNI R MOD SED  05/02/2017  . IR GENERIC HISTORICAL  04/24/2016   IR TRANSCATH/EMBOLIZ 04/24/2016 Luanne Bras, MD [facility]  . IR GENERIC HISTORICAL  04/24/2016   IR ANGIOGRAM FOLLOW UP STUDY 04/24/2016 Luanne Bras, MD [facility]  . IR GENERIC HISTORICAL  04/24/2016   IR NEURO EACH ADD'L AFTER BASIC UNI RIGHT (MS) 04/24/2016 Luanne Bras, MD [facility]  . IR GENERIC HISTORICAL  04/24/2016   IR ANGIO INTRA EXTRACRAN SEL INTERNAL CAROTID UNI R MOD SED 04/24/2016 Luanne Bras, MD [facility]  . IR GENERIC HISTORICAL  05/19/2016   IR RADIOLOGIST EVAL & MGMT 05/19/2016 [facility]  . IR GENERIC HISTORICAL  08/24/2016   IR ANGIO VERTEBRAL SEL SUBCLAVIAN INNOMINATE UNI R MOD SED  08/24/2016 Luanne Bras, MD [facility]  . IR GENERIC HISTORICAL  08/24/2016   IR ANGIO INTRA EXTRACRAN SEL COM CAROTID INNOMINATE BILAT MOD SED 08/24/2016 Luanne Bras, MD [facility]  . IR RADIOLOGIST EVAL & MGMT  11/29/2017  . KNEE ARTHROSCOPY Right   . LUMBAR DISC SURGERY    . MOHS SURGERY     basal cell  face  . RADIOLOGY WITH ANESTHESIA N/A 04/24/2016   Procedure: EMBOLIZATION;  Surgeon: Luanne Bras, MD;  Location: [place];  Service: Radiology;  Laterality: N/A;  . RADIOLOGY WITH ANESTHESIA N/A 05/02/2017   Procedure: RADIOLOGY WITH ANESTHESIA     EMBOLIZATION;  Surgeon: Luanne Bras, MD;  Location: [place];  Service: Radiology;  Laterality: N/A;  . TONSILLECTOMY      Allergies: Clopidogrel; Keflex [cephalexin]; Ivp dye [iodinated diagnostic agents]; Verapamil; and Glipizide  Medications: Prior to Admission medications   Medication Sig Start Date End Date Taking? Authorizing Provider  aspirin 81 MG tablet Take 81 mg by mouth daily.     [provider]  calcium carbonate (TUMS - DOSED IN MG ELEMENTAL CALCIUM) 500 MG chewable tablet Chew 1 tablet by mouth daily as needed for indigestion or heartburn.    [provider]  cetirizine (ZYRTEC) 10 MG tablet Take 10 mg by mouth daily.    [provider]  dapagliflozin propanediol (FARXIGA) 10 MG TABS tablet Take 10 mg by mouth every morning.    [provider]  Eduard Roux (AIMOVIG [place]) Inject into the skin. monthly    [provider]  meloxicam (MOBIC) 15 MG tablet Take 15 mg by mouth daily. 11/28/17   [provider]  metFORMIN (GLUCOPHAGE) 1000 MG tablet Take 1,000 mg by mouth 2 (two) times daily with a meal.    [provider]  methocarbamol (ROBAXIN) 500 MG tablet Take 1 tablet (500 mg total) by mouth 3 (three) times daily as needed. 01/07/18   Netta Cedars, MD  oxyCODONE-acetaminophen (PERCOCET) 5-325 MG tablet Take 1-2 tablets by mouth every 4 (four) hours as needed for severe pain. 01/07/18 01/07/19  Netta Cedars, MD  simethicone (MYLICON) 284 MG chewable tablet Chew 125 mg by mouth every 6 (six) hours as needed for flatulence.    [provider]     Family History  Problem Relation Age of Onset  . Seizures Mother   . Schizophrenia Father   . Cancer Paternal  Grandfather     Social History   Socioeconomic History  . Marital status: Married    Spouse name: elizabeth  . Number of children: 2  . Years of education: [name]  . Highest education level: Not on file  Occupational History  . Occupation: [employer]  Social Needs  . Financial resource strain: Not on file  . Food insecurity:    Worry: Not on file    Inability: Not on file  . Transportation needs:    Medical: Not on file    Non-medical: Not on file  Tobacco Use  . Smoking status: Former Smoker    Last attempt to quit: 05/01/1987    Years since quitting: 31.5  . Smokeless tobacco: Never Used  . Tobacco comment: quit smoking 30 years ago 02/09/16  Substance and Sexual Activity  . Alcohol use: Not Currently    Comment: occasional  . Drug use: No  . Sexual activity: Not on file  Lifestyle  . Physical activity:    Days per week: Not on file    Minutes per session: Not on  file  . Stress: Not on file  Relationships  . Social connections:    Talks on phone: Not on file    Gets together: Not on file    Attends religious service: Not on file    Active member of club or organization: Not on file    Attends meetings of clubs or organizations: Not on file    Relationship status: Not on file  Other Topics Concern  . Not on file  Social History Narrative   Lives at home with wife    Caffeine use- coffee, 5 cups daily     Review of Systems: A 12 point ROS discussed and pertinent positives are indicated in the HPI above.  All other systems are negative.  Review of Systems  Constitutional: Negative for chills and fever.  HENT: Negative for hearing loss and tinnitus.   Eyes: Positive for visual disturbance.  Respiratory: Negative for shortness of breath and wheezing.   Cardiovascular: Negative for chest pain and palpitations.  Neurological: Positive for dizziness. Negative for syncope, speech difficulty, weakness, numbness and headaches.  Psychiatric/Behavioral: Negative for behavioral  problems and confusion.    Physical Exam Constitutional:      General: He is not in acute distress.    Appearance: Normal appearance.  Pulmonary:     Effort: Pulmonary effort is normal. No respiratory distress.  Skin:    General: Skin is warm and dry.  Neurological:     Mental Status: He is alert and oriented to person, place, and time.  Psychiatric:        Mood and Affect: Mood normal.        Behavior: Behavior normal.        Thought Content: Thought content normal.        Judgment: Judgment normal.      Imaging: Mr Virgel Paling Wo Contrast  Result Date: 10/17/2018 CLINICAL DATA:  Follow-up aneurysm. EXAM: MRA HEAD WITHOUT CONTRAST TECHNIQUE: Angiographic images of the Circle of Willis were obtained using MRA technique without intravenous contrast. COMPARISON:  MRA 10/22/2017.  Catheter angiogram 08/24/2016. FINDINGS: 3 x 2 mm area of residual RIGHT MCA aneurysm filling, unchanged compared with most recent MRA. Slight decrease from most recent cerebral angiogram. Patent stent at neck of aneurysm, through and beyond which the superior division M2 RIGHT MCA passes. Normal anterior and posterior circulation elsewhere. IMPRESSION: Stable residual 3 x 2 mm RIGHT MCA aneurysm neck filling. No change from 2019 MRA. Slight decreased from 2017 catheter angiogram. Electronically Signed   By: Staci Righter M.D.   On: 10/17/2018 16:28    Labs:  CBC: Recent Labs    01/07/18 0958  WBC 8.9  HGB 16.8  HCT 47.6  PLT 228    BMP: Recent Labs    01/07/18 0958  NA 138  K 4.2  CL 104  CO2 23  GLUCOSE 137*  BUN 9  CALCIUM 9.3  CREATININE 0.93  GFRNONAA >60  GFRAA >60    Assessment and Plan:  Right MCA trifurcation aneurysm s/p embolization using a LVIS JR stent 04/24/2016 by Dr. Estanislado Pandy. Dr. Estanislado Pandy was present for consultation. Reviewed imaging with patient and wife. Brought to their attention was patient's right MCA trifurcation aneurysm. Explained this aneurysm does have  evidence of residual neck filling, but this filling is stable on most recent imaging. Explained that there are two management options moving forward- either continued medical management with Aspirin use and routine imaging scans to monitor for changes or with a procedure called  an image-guided cerebral arteriogram with intent to treat right MCA trifurcation aneurysm. Explained procedure, including risks and benefits. Patient expresses desire to move forward with endovascular intervention.  Discussed DAPT. Patient asks how long he will need to remain on DAPT following intervention. Instructed patient that if a stent is placed, he will continue DAPT for a minimum of 3 months following procedure. Instructed patient that if coils are placed, he will continue DAPT for a minimum of 1 month following procedure. Patient states that he cannot take Plavix, as it caused "bleeding and chest burning". Patient states that last time he took Brilinta, but he is very sensative to it. Informed patient that we will have patient begin with a lower dose (45 mg twice daily) 7 days prior to intervention, then check P2Y12 2-3 days prior to intervention. Confirmed pharmacy with patient- [facility] in [place], [place] [phone]). Called pharmacy at 1540 to fill prescription (Brilinta 90 mg tablets, take half a tablet by mouth twice daily, dispense 30 tablets with 3 refills). Instructed patient not to begin taking Brilinta until he is directed to do so from our office.  Discussed patient's diabetes mellitus. Patient states that his AM sugars run in the 160s. Recommended that patient follow-up with PCP regarding diabetes mellitus management.  Plan for follow-up with an image-guided cerebral arteriogram with intent to treat right MCA tirfurcation aneurysm ASAP. Informed patient that our schedulers will call him to set up this procedure. Informed patient that once a procedure date is chosen, our schedulers will also let him know when to  begin taking Brilinta. Instructed patient to continue taking Aspirin 81 mg once daily.  All questions answered and concerns addressed. Patient and wife convey understanding and agree with plan.  Thank you for this interesting consult.  I greatly enjoyed meeting VERLON PISCHKE and look forward to participating in their care.  A copy of this report was sent to the requesting provider on this date.  Electronically Signed: Earley Abide, PA-C 10/28/2018, 10:49 AM   I spent a total of 40 Minutes in face to face in clinical consultation, greater than 50% of which was counseling/coordinating care for right MCA trifurcation aneurysm s/p embolization.

## 2018-11-05 ENCOUNTER — Telehealth (HOSPITAL_COMMUNITY): Payer: Self-pay | Admitting: Radiology

## 2018-11-05 ENCOUNTER — Other Ambulatory Visit (HOSPITAL_COMMUNITY): Payer: Self-pay | Admitting: Interventional Radiology

## 2018-11-05 DIAGNOSIS — I671 Cerebral aneurysm, nonruptured: Secondary | ICD-10-CM

## 2018-11-05 NOTE — Telephone Encounter (Signed)
Called pt to schedule aneurysm tx. No answer, no VM. JM

## 2018-11-05 NOTE — Telephone Encounter (Signed)
Called pt to set up his brain aneurysm embolization procedure with Deveshwar. Pt states that he is currently working w/ this PCP to get his DM under control and would like for me to call him back in 2 wks to see how he is doing. JM

## 2018-12-30 ENCOUNTER — Other Ambulatory Visit: Payer: Self-pay

## 2018-12-30 ENCOUNTER — Encounter (HOSPITAL_COMMUNITY): Payer: Self-pay

## 2018-12-30 ENCOUNTER — Emergency Department (HOSPITAL_COMMUNITY)
Admission: EM | Admit: 2018-12-30 | Discharge: 2018-12-31 | Disposition: A | Discharge status: Home / Self Care | Payer: No Typology Code available for payment source | Attending: Emergency Medicine | Admitting: Emergency Medicine

## 2018-12-30 ENCOUNTER — Ambulatory Visit (INDEPENDENT_AMBULATORY_CARE_PROVIDER_SITE_OTHER)
Admission: EM | Admit: 2018-12-30 | Discharge: 2018-12-30 | Disposition: A | Discharge status: Home / Self Care | Payer: No Typology Code available for payment source | Source: Home / Self Care | Attending: Family Medicine | Admitting: Family Medicine

## 2018-12-30 ENCOUNTER — Encounter (HOSPITAL_COMMUNITY): Payer: Self-pay | Admitting: Emergency Medicine

## 2018-12-30 DIAGNOSIS — Z79899 Other long term (current) drug therapy: Secondary | ICD-10-CM | POA: Diagnosis not present

## 2018-12-30 DIAGNOSIS — Z87891 Personal history of nicotine dependence: Secondary | ICD-10-CM | POA: Insufficient documentation

## 2018-12-30 DIAGNOSIS — R109 Unspecified abdominal pain: Secondary | ICD-10-CM | POA: Diagnosis not present

## 2018-12-30 DIAGNOSIS — K529 Noninfective gastroenteritis and colitis, unspecified: Secondary | ICD-10-CM | POA: Diagnosis not present

## 2018-12-30 DIAGNOSIS — Z85828 Personal history of other malignant neoplasm of skin: Secondary | ICD-10-CM | POA: Insufficient documentation

## 2018-12-30 DIAGNOSIS — E669 Obesity, unspecified: Secondary | ICD-10-CM | POA: Insufficient documentation

## 2018-12-30 DIAGNOSIS — E119 Type 2 diabetes mellitus without complications: Secondary | ICD-10-CM | POA: Insufficient documentation

## 2018-12-30 DIAGNOSIS — Z7984 Long term (current) use of oral hypoglycemic drugs: Secondary | ICD-10-CM | POA: Insufficient documentation

## 2018-12-30 DIAGNOSIS — Z6836 Body mass index (BMI) 36.0-36.9, adult: Secondary | ICD-10-CM | POA: Diagnosis not present

## 2018-12-30 DIAGNOSIS — Z7982 Long term (current) use of aspirin: Secondary | ICD-10-CM | POA: Diagnosis not present

## 2018-12-30 DIAGNOSIS — R1032 Left lower quadrant pain: Secondary | ICD-10-CM | POA: Diagnosis present

## 2018-12-30 LAB — COMPREHENSIVE METABOLIC PANEL
ALT: 26 U/L (ref 0–44)
AST: 19 U/L (ref 15–41)
Albumin: 4.3 g/dL (ref 3.5–5.0)
Alkaline Phosphatase: 55 U/L (ref 38–126)
Anion gap: 13 (ref 5–15)
BUN: 12 mg/dL (ref 6–20)
CO2: 26 mmol/L (ref 22–32)
Calcium: 9.5 mg/dL (ref 8.9–10.3)
Chloride: 99 mmol/L (ref 98–111)
Creatinine, Ser: 1.26 mg/dL — ABNORMAL HIGH (ref 0.61–1.24)
GFR calc Af Amer: >60 mL/min (ref >60)
GFR calc non Af Amer: >60 mL/min (ref >60)
Glucose, Bld: 141 mg/dL — ABNORMAL HIGH (ref 70–99)
Potassium: 3.8 mmol/L (ref 3.5–5.1)
Sodium: 138 mmol/L (ref 135–145)
Total Bilirubin: 0.6 mg/dL (ref 0.3–1.2)
Total Protein: 7.6 g/dL (ref 6.5–8.1)

## 2018-12-30 LAB — URINALYSIS, ROUTINE W REFLEX MICROSCOPIC
Bacteria, UA: NONE SEEN
Bilirubin Urine: NEGATIVE
Glucose, UA: >=500 mg/dL — AB
Hgb urine dipstick: NEGATIVE
Ketones, ur: 5 mg/dL — AB
Leukocytes,Ua: NEGATIVE
Nitrite: NEGATIVE
Protein, ur: NEGATIVE mg/dL
Specific Gravity, Urine: 1.03 (ref 1.005–1.030)
pH: 5 (ref 5.0–8.0)

## 2018-12-30 LAB — CBC
HCT: 50.3 % (ref 39.0–52.0)
Hemoglobin: 16.8 g/dL (ref 13.0–17.0)
MCH: 28.3 pg (ref 26.0–34.0)
MCHC: 33.4 g/dL (ref 30.0–36.0)
MCV: 84.8 fL (ref 80.0–100.0)
Platelets: 286 10*3/uL (ref 150–400)
RBC: 5.93 MIL/uL — ABNORMAL HIGH (ref 4.22–5.81)
RDW: 13 % (ref 11.5–15.5)
WBC: 11.4 10*3/uL — ABNORMAL HIGH (ref 4.0–10.5)
nRBC: 0 % (ref 0.0–0.2)

## 2018-12-30 LAB — LIPASE, BLOOD: Lipase: 37 U/L (ref 11–51)

## 2018-12-30 MED ORDER — SODIUM CHLORIDE 0.9% FLUSH
3.0000 mL | Freq: Once | INTRAVENOUS | Status: AC
  Administered 2018-12-31: 01:00:00 3 mL via INTRAVENOUS

## 2018-12-30 NOTE — ED Triage Notes (Addendum)
Pt presents to ED c/o LLQ pain. Pain is a 9/10, stabbing. Pain started 4 days ago, worse at night. Pt reports abnormal bowel movements just before this started. Was seen at urgent care just before this. Pt reports they did not think it was diverticulitis because doctor stated he had rebound pain.

## 2018-12-30 NOTE — ED Triage Notes (Signed)
Patient presents to Urgent Care with complaints of LLQ abdominal pain since 4 days ago. Patient states a heating pad helped slightly this morning, has not tried any meds. Pt denies frequent or burning with urination, has not had pain like this in the past.

## 2018-12-30 NOTE — ED Provider Notes (Signed)
[facility]    CSN: [id number] Arrival date & time: 12/30/18  1904     History   Chief Complaint Chief Complaint  Patient presents with  . Abdominal Pain    HPI Nicholas Wheeler is a 58 y.o. male.   HPI Patient has 4 days of gradually worsening left lower quadrant abdominal pain.  He has no known abdominal conditions, irritable bowel syndrome, constipation, diverticulosis, or ulcer disease.  He is never had any abdominal surgery.  He has a normal appetite and is eating normally.  No nausea no vomiting.  No fever no chills.  He states he still having bowel movements although some of them have been looser.  The pain is severe in the left lower quadrant.  It is getting worse every time.  It hurts when he breathes.  It hurts when he steps down on his left foot.  Spoke with his physician who stated that he needed to have a medical evaluation. He has well-controlled diabetes.  He states he has a cerebral aneurysm.  He states he also has history of colon polyps but does not know if he has diverticular disease.  He is overdue for a colonoscopy. Past Medical History:  Diagnosis Date  . Aneurysm ([place])   . Arthritis   . AV block 2010   caused by verapamil--given for migraines no problems since   . Complication of anesthesia    "woke up violently after last arteriogram " ?  . Diabetes ([place])    Type II  . Diabetic neuropathy ([place])   . GERD (gastroesophageal reflux disease)   . History of hiatal hernia   . Hypersomnia with sleep apnea, unspecified 09/08/2013  . Joint disorder    degenerated  . Migraine   . Migraine   . Obesity (BMI 35.0-39.9 without comorbidity)   . Recurrent sinus infections   . Skin cancer   . Sleep apnea    saw Dr Brett Fairy in 09/2013; wears CPAP set at 14  . TMJ (temporomandibular joint disorder)   . Wears glasses     Patient Active Problem List   Diagnosis Date Noted  . Brain aneurysm 04/24/2016  . Hypersomnia with sleep apnea, unspecified 09/08/2013     Past Surgical History:  Procedure Laterality Date  . ARTHOSCOPIC ROTAOR CUFF REPAIR Right 01/07/2018   Procedure: RIGHT SHOULDER ARTHROSCOPY, SUBACROMIAL DECOMPRESSION, OPEN ROTATOR CUFF REPAIR, OPEN DISTAL CLAVICLE EXCISION, BICEPS TENODESIS, SLAP DEBRIDMENT;  Surgeon: Netta Cedars, MD;  Location: [facility];  Service: Orthopedics;  Laterality: Right;  . CARPAL TUNNEL RELEASE Right   . COLONOSCOPY W/ BIOPSIES AND POLYPECTOMY    . IR ANGIO EXTRACRAN SEL COM CAROTID INNOMINATE UNI R MOD SED  05/02/2017  . IR GENERIC HISTORICAL  04/24/2016   IR TRANSCATH/EMBOLIZ 04/24/2016 Luanne Bras, MD [facility]  . IR GENERIC HISTORICAL  04/24/2016   IR ANGIOGRAM FOLLOW UP STUDY 04/24/2016 Luanne Bras, MD [facility]  . IR GENERIC HISTORICAL  04/24/2016   IR NEURO EACH ADD'L AFTER BASIC UNI RIGHT (MS) 04/24/2016 Luanne Bras, MD [facility]  . IR GENERIC HISTORICAL  04/24/2016   IR ANGIO INTRA EXTRACRAN SEL INTERNAL CAROTID UNI R MOD SED 04/24/2016 Luanne Bras, MD [facility]  . IR GENERIC HISTORICAL  05/19/2016   IR RADIOLOGIST EVAL & MGMT 05/19/2016 [facility]  . IR GENERIC HISTORICAL  08/24/2016   IR ANGIO VERTEBRAL SEL SUBCLAVIAN INNOMINATE UNI R MOD SED 08/24/2016 Luanne Bras, MD [facility]  . IR GENERIC HISTORICAL  08/24/2016  IR ANGIO INTRA EXTRACRAN SEL COM CAROTID INNOMINATE BILAT MOD SED 08/24/2016 Luanne Bras, MD [facility]  . IR RADIOLOGIST EVAL & MGMT  11/29/2017  . KNEE ARTHROSCOPY Right   . LUMBAR DISC SURGERY    . MOHS SURGERY     basal cell face  . RADIOLOGY WITH ANESTHESIA N/A 04/24/2016   Procedure: EMBOLIZATION;  Surgeon: Luanne Bras, MD;  Location: [facility];  Service: Radiology;  Laterality: N/A;  . RADIOLOGY WITH ANESTHESIA N/A 05/02/2017   Procedure: RADIOLOGY WITH ANESTHESIA     EMBOLIZATION;  Surgeon: Luanne Bras, MD;  Location: [facility];  Service: Radiology;  Laterality: N/A;  . TONSILLECTOMY         Home Medications     Prior to Admission medications   Medication Sig Start Date End Date Taking? Authorizing Provider  aspirin 81 MG tablet Take 81 mg by mouth daily.    Yes [provider]  calcium carbonate (TUMS - DOSED IN MG ELEMENTAL CALCIUM) 500 MG chewable tablet Chew 1 tablet by mouth daily as needed for indigestion or heartburn.   Yes [provider]  cetirizine (ZYRTEC) 10 MG tablet Take 10 mg by mouth daily.   Yes [provider]  dapagliflozin propanediol (FARXIGA) 10 MG TABS tablet Take 10 mg by mouth every morning.   Yes [provider]  hydrochlorothiazide (MICROZIDE) 12.5 MG capsule Take 12.5 mg by mouth daily.   Yes [provider]  metFORMIN (GLUCOPHAGE) 1000 MG tablet Take 1,000 mg by mouth 2 (two) times daily with a meal.   Yes [provider]  methocarbamol (ROBAXIN) 500 MG tablet Take 1 tablet (500 mg total) by mouth 3 (three) times daily as needed. 01/07/18  Yes Netta Cedars, MD  Eduard Roux (AIMOVIG [place]) Inject into the skin. monthly    [provider]  simethicone (MYLICON) 333 MG chewable tablet Chew 125 mg by mouth every 6 (six) hours as needed for flatulence.    [provider]    Family History Family History  Problem Relation Age of Onset  . Seizures Mother   . Schizophrenia Father   . Cancer Paternal Grandfather     Social History Social History   Tobacco Use  . Smoking status: Former Smoker    Last attempt to quit: 05/01/1987    Years since quitting: 31.6  . Smokeless tobacco: Never Used  . Tobacco comment: quit smoking 30 years ago 02/09/16  Substance Use Topics  . Alcohol use: Not Currently    Comment: occasional  . Drug use: No     Allergies   Clopidogrel; Keflex [cephalexin]; Ivp dye [iodinated diagnostic agents]; Verapamil; and Glipizide   Review of Systems Review of Systems  Constitutional: Negative for chills and fever.  HENT: Negative for ear pain and sore throat.   Eyes: Negative for  pain and visual disturbance.  Respiratory: Negative for cough and shortness of breath.   Cardiovascular: Negative for chest pain and palpitations.  Gastrointestinal: Positive for abdominal pain. Negative for vomiting.  Genitourinary: Negative for dysuria and hematuria.  Musculoskeletal: Negative for arthralgias and back pain.  Skin: Negative for color change and rash.  Neurological: Negative for seizures and syncope.  All other systems reviewed and are negative.    Physical Exam Triage Vital Signs ED Triage Vitals  Enc Vitals Group     BP 12/30/18 1920 (!) 145/94     Pulse Rate 12/30/18 1920 84     Resp 12/30/18 1920 18     Temp 12/30/18  1920 98.7 F (37.1 C)     Temp Source 12/30/18 1920 Oral     SpO2 12/30/18 1920 100 %     Weight --      Height --      Head Circumference --      Peak Flow --      Pain Score 12/30/18 1916 9     Pain Loc --      Pain Edu? --      Excl. in [place]? --    No data found.  Updated Vital Signs BP (!) 145/94 (BP Location: Left Arm)   Pulse 84   Temp 98.7 F (37.1 C) (Oral)   Resp 18   SpO2 100%      Physical Exam Constitutional:      General: He is in acute distress.     Appearance: He is well-developed. He is obese.  HENT:     Head: Normocephalic and atraumatic.  Eyes:     Conjunctiva/sclera: Conjunctivae normal.     Pupils: Pupils are equal, round, and reactive to light.  Neck:     Musculoskeletal: Normal range of motion.  Cardiovascular:     Rate and Rhythm: Normal rate.  Pulmonary:     Effort: Pulmonary effort is normal. No respiratory distress.     Breath sounds: Normal breath sounds.  Abdominal:     General: Abdomen is protuberant. There is no distension.     Palpations: Abdomen is soft. There is no hepatomegaly or splenomegaly.     Tenderness: There is abdominal tenderness in the left lower quadrant. There is guarding and rebound.  Musculoskeletal: Normal range of motion.  Skin:    General: Skin is warm and dry.   Neurological:     Mental Status: He is alert.  Psychiatric:        Mood and Affect: Mood normal.        Behavior: Behavior normal.      UC Treatments / Results  Labs (all labs ordered are listed, but only abnormal results are displayed) Labs Reviewed - No data to display  EKG None  Radiology No results found.  Procedures Procedures (including critical care time)  Medications Ordered in UC Medications - No data to display  Initial Impression / Assessment and Plan / UC Course  I have reviewed the triage vital signs and the nursing notes.  Pertinent labs & imaging results that were available during my care of the patient were reviewed by me and considered in my medical decision making (see chart for details).     Patient has a degree of acute abdominal pain that is going to require imaging.  I do not believe with his acute abdomen findings that work-up in the urgent care center is appropriate. Final Clinical Impressions(s) / UC Diagnoses   Final diagnoses:  Acute abdominal pain     Discharge Instructions     You have a degree of acute abdominal pain with the physical finding of rebound tenderness that requires evaluation in the emergency department.  You will need laboratory testing and likely a scan of your abdomen.   ED Prescriptions    None     Controlled Substance Prescriptions [place] Controlled Substance Registry consulted? Not Applicable   Raylene Everts, MD 12/30/18 2037

## 2018-12-30 NOTE — Discharge Instructions (Addendum)
You have a degree of acute abdominal pain with the physical finding of rebound tenderness that requires evaluation in the emergency department.  You will need laboratory testing and likely a scan of your abdomen.

## 2018-12-31 ENCOUNTER — Emergency Department (HOSPITAL_COMMUNITY): Payer: No Typology Code available for payment source

## 2018-12-31 MED ORDER — SODIUM CHLORIDE 0.9 % IV BOLUS
1000.0000 mL | Freq: Once | INTRAVENOUS | Status: AC
  Administered 2018-12-31: 01:00:00 1000 mL via INTRAVENOUS

## 2018-12-31 MED ORDER — OXYCODONE-ACETAMINOPHEN 5-325 MG PO TABS: 1.0000 | ORAL_TABLET | Freq: Four times a day (QID) | ORAL | 0 refills | Status: DC | PRN

## 2018-12-31 MED ORDER — HYDROCORTISONE NA SUCCINATE PF 100 MG IJ SOLR
200.0000 mg | Freq: Once | INTRAMUSCULAR | Status: AC
  Administered 2018-12-31: 200 mg via INTRAVENOUS
  Filled 2018-12-31: qty 4

## 2018-12-31 MED ORDER — HYDROCORTISONE NA SUCCINATE PF 250 MG IJ SOLR: 200.0000 mg | Freq: Once | INTRAMUSCULAR | Status: DC

## 2018-12-31 MED ORDER — MORPHINE SULFATE (PF) 4 MG/ML IV SOLN
4.0000 mg | Freq: Once | INTRAVENOUS | Status: AC
  Administered 2018-12-31: 01:00:00 4 mg via INTRAVENOUS
  Filled 2018-12-31: qty 1

## 2018-12-31 MED ORDER — CIPROFLOXACIN HCL 500 MG PO TABS: 500.0000 mg | ORAL_TABLET | Freq: Two times a day (BID) | ORAL | 0 refills | Status: DC

## 2018-12-31 MED ORDER — METRONIDAZOLE 500 MG PO TABS: 500.0000 mg | ORAL_TABLET | Freq: Three times a day (TID) | ORAL | 0 refills | Status: DC

## 2018-12-31 MED ORDER — ONDANSETRON HCL 4 MG/2ML IJ SOLN
4.0000 mg | Freq: Once | INTRAMUSCULAR | Status: AC
  Administered 2018-12-31: 4 mg via INTRAVENOUS
  Filled 2018-12-31: qty 2

## 2018-12-31 MED ORDER — MORPHINE SULFATE (PF) 4 MG/ML IV SOLN
4.0000 mg | Freq: Once | INTRAVENOUS | Status: AC | PRN
  Administered 2018-12-31: 03:00:00 4 mg via INTRAVENOUS
  Filled 2018-12-31: qty 1

## 2018-12-31 MED ORDER — IOHEXOL 300 MG/ML  SOLN
125.0000 mL | Freq: Once | INTRAMUSCULAR | Status: AC | PRN
  Administered 2018-12-31: 125 mL via INTRAVENOUS

## 2018-12-31 MED ORDER — CIPROFLOXACIN HCL 500 MG PO TABS
500.0000 mg | ORAL_TABLET | Freq: Once | ORAL | Status: AC
  Administered 2018-12-31: 500 mg via ORAL
  Filled 2018-12-31: qty 1

## 2018-12-31 MED ORDER — HYDROCODONE-ACETAMINOPHEN 5-325 MG PO TABS
1.0000 | ORAL_TABLET | Freq: Once | ORAL | Status: AC
  Administered 2018-12-31: 1 via ORAL
  Filled 2018-12-31: qty 1

## 2018-12-31 MED ORDER — IOHEXOL 350 MG/ML SOLN: 125.0000 mL | Freq: Once | INTRAVENOUS | Status: DC | PRN

## 2018-12-31 MED ORDER — DIPHENHYDRAMINE HCL 50 MG/ML IJ SOLN
50.0000 mg | Freq: Once | INTRAMUSCULAR | Status: AC
  Administered 2018-12-31: 50 mg via INTRAVENOUS
  Filled 2018-12-31: qty 1

## 2018-12-31 MED ORDER — DIPHENHYDRAMINE HCL 25 MG PO CAPS
50.0000 mg | ORAL_CAPSULE | Freq: Once | ORAL | Status: AC
  Filled 2018-12-31: qty 2

## 2018-12-31 MED ORDER — METRONIDAZOLE 500 MG PO TABS
500.0000 mg | ORAL_TABLET | Freq: Once | ORAL | Status: AC
  Administered 2018-12-31: 500 mg via ORAL
  Filled 2018-12-31: qty 1

## 2018-12-31 NOTE — ED Notes (Signed)
Patient transported to CT 

## 2018-12-31 NOTE — ED Provider Notes (Signed)
[facility] EMERGENCY DEPARTMENT Provider Note   CSN: [id number] Arrival date & time: 12/30/18  2009    History   Chief Complaint Chief Complaint  Patient presents with  . Abdominal Pain    HPI Nicholas Wheeler is a 58 y.o. male.     58 year old male with a history of obesity, esophageal reflux, cerebral aneurysm, type 2 diabetes presents to the emergency department for evaluation of left lower quadrant pain x4 days.  Pain has been constant and waxing and waning in severity.  It will sometimes be relieved with applied pressure to the left lower abdomen.  He denies any radiation of the pain.  No fevers, nausea, vomiting, melena, new or worsening hematochezia, urinary symptoms.  Denies a history of abdominal surgeries.  He has not taken any medications for his pain.  Presented to urgent care who directed him to the emergency department for additional evaluation.  The history is provided by the patient. No language interpreter was used.  Abdominal Pain    Past Medical History:  Diagnosis Date  . Aneurysm ([place])   . Arthritis   . AV block 2010   caused by verapamil--given for migraines no problems since   . Complication of anesthesia    "woke up violently after last arteriogram " ?  . Diabetes ([place])    Type II  . Diabetic neuropathy ([place])   . GERD (gastroesophageal reflux disease)   . History of hiatal hernia   . Hypersomnia with sleep apnea, unspecified 09/08/2013  . Joint disorder    degenerated  . Migraine   . Migraine   . Obesity (BMI 35.0-39.9 without comorbidity)   . Recurrent sinus infections   . Skin cancer   . Sleep apnea    saw Dr Brett Fairy in 09/2013; wears CPAP set at 14  . TMJ (temporomandibular joint disorder)   . Wears glasses     Patient Active Problem List   Diagnosis Date Noted  . Brain aneurysm 04/24/2016  . Hypersomnia with sleep apnea, unspecified 09/08/2013    Past Surgical History:  Procedure Laterality Date  . ARTHOSCOPIC  ROTAOR CUFF REPAIR Right 01/07/2018   Procedure: RIGHT SHOULDER ARTHROSCOPY, SUBACROMIAL DECOMPRESSION, OPEN ROTATOR CUFF REPAIR, OPEN DISTAL CLAVICLE EXCISION, BICEPS TENODESIS, SLAP DEBRIDMENT;  Surgeon: Netta Cedars, MD;  Location: [place];  Service: Orthopedics;  Laterality: Right;  . CARPAL TUNNEL RELEASE Right   . COLONOSCOPY W/ BIOPSIES AND POLYPECTOMY    . IR ANGIO EXTRACRAN SEL COM CAROTID INNOMINATE UNI R MOD SED  05/02/2017  . IR GENERIC HISTORICAL  04/24/2016   IR TRANSCATH/EMBOLIZ 04/24/2016 Luanne Bras, MD [facility]  . IR GENERIC HISTORICAL  04/24/2016   IR ANGIOGRAM FOLLOW UP STUDY 04/24/2016 Luanne Bras, MD [facility]  . IR GENERIC HISTORICAL  04/24/2016   IR NEURO EACH ADD'L AFTER BASIC UNI RIGHT (MS) 04/24/2016 Luanne Bras, MD [facility]  . IR GENERIC HISTORICAL  04/24/2016   IR ANGIO INTRA EXTRACRAN SEL INTERNAL CAROTID UNI R MOD SED 04/24/2016 Luanne Bras, MD [facility]  . IR GENERIC HISTORICAL  05/19/2016   IR RADIOLOGIST EVAL & MGMT 05/19/2016 [facility]  . IR GENERIC HISTORICAL  08/24/2016   IR ANGIO VERTEBRAL SEL SUBCLAVIAN INNOMINATE UNI R MOD SED 08/24/2016 Luanne Bras, MD [facility]  . IR GENERIC HISTORICAL  08/24/2016   IR ANGIO INTRA EXTRACRAN SEL COM CAROTID INNOMINATE BILAT MOD SED 08/24/2016 Luanne Bras, MD [facility]  . IR RADIOLOGIST EVAL & MGMT  11/29/2017  .  KNEE ARTHROSCOPY Right   . LUMBAR DISC SURGERY    . MOHS SURGERY     basal cell face  . RADIOLOGY WITH ANESTHESIA N/A 04/24/2016   Procedure: EMBOLIZATION;  Surgeon: Luanne Bras, MD;  Location: [place];  Service: Radiology;  Laterality: N/A;  . RADIOLOGY WITH ANESTHESIA N/A 05/02/2017   Procedure: RADIOLOGY WITH ANESTHESIA     EMBOLIZATION;  Surgeon: Luanne Bras, MD;  Location: [place];  Service: Radiology;  Laterality: N/A;  . TONSILLECTOMY          Home Medications    Prior to Admission medications   Medication Sig Start Date End Date  Taking? Authorizing Provider  aspirin 81 MG tablet Take 81 mg by mouth daily.     [provider]  calcium carbonate (TUMS - DOSED IN MG ELEMENTAL CALCIUM) 500 MG chewable tablet Chew 1 tablet by mouth daily as needed for indigestion or heartburn.    [provider]  cetirizine (ZYRTEC) 10 MG tablet Take 10 mg by mouth daily.    [provider]  ciprofloxacin (CIPRO) 500 MG tablet Take 1 tablet (500 mg total) by mouth every 12 (twelve) hours. 12/31/18   Antonietta Breach, PA-C  dapagliflozin propanediol (FARXIGA) 10 MG TABS tablet Take 10 mg by mouth every morning.    [provider]  Eduard Roux (AIMOVIG [place]) Inject into the skin. monthly    [provider]  hydrochlorothiazide (MICROZIDE) 12.5 MG capsule Take 12.5 mg by mouth daily.    [provider]  metFORMIN (GLUCOPHAGE) 1000 MG tablet Take 1,000 mg by mouth 2 (two) times daily with a meal.    [provider]  methocarbamol (ROBAXIN) 500 MG tablet Take 1 tablet (500 mg total) by mouth 3 (three) times daily as needed. 01/07/18   Netta Cedars, MD  metroNIDAZOLE (FLAGYL) 500 MG tablet Take 1 tablet (500 mg total) by mouth 3 (three) times daily. 12/31/18   Antonietta Breach, PA-C  oxyCODONE-acetaminophen (PERCOCET/ROXICET) 5-325 MG tablet Take 1 tablet by mouth every 6 (six) hours as needed for severe pain. 12/31/18   Antonietta Breach, PA-C  simethicone (MYLICON) 621 MG chewable tablet Chew 125 mg by mouth every 6 (six) hours as needed for flatulence.    [provider]    Family History Family History  Problem Relation Age of Onset  . Seizures Mother   . Schizophrenia Father   . Cancer Paternal Grandfather     Social History Social History   Tobacco Use  . Smoking status: Former Smoker    Last attempt to quit: 05/01/1987    Years since quitting: 31.6  . Smokeless tobacco: Never Used  . Tobacco comment: quit smoking 30 years ago 02/09/16  Substance Use Topics  . Alcohol use: Not  Currently    Comment: occasional  . Drug use: No     Allergies   Clopidogrel; Keflex [cephalexin]; Ivp dye [iodinated diagnostic agents]; Verapamil; and Glipizide   Review of Systems Review of Systems  Gastrointestinal: Positive for abdominal pain.  Ten systems reviewed and are negative for acute change, except as noted in the HPI.    Physical Exam Updated Vital Signs BP 111/77   Pulse 87   Temp 98.3 F (36.8 C) (Oral)   Resp 18   Ht 5\' 7"  (1.702 m)   Wt 105.7 kg   SpO2 92%   BMI 36.49 kg/m   Physical Exam Vitals signs and nursing note reviewed.  Constitutional:      General: He is not  in acute distress.    Appearance: He is well-developed. He is not diaphoretic.     Comments: Nontoxic appearing and in NAD  HENT:     Head: Normocephalic and atraumatic.  Eyes:     General: No scleral icterus.    Conjunctiva/sclera: Conjunctivae normal.  Neck:     Musculoskeletal: Normal range of motion.  Cardiovascular:     Rate and Rhythm: Normal rate and regular rhythm.     Pulses: Normal pulses.  Pulmonary:     Effort: Pulmonary effort is normal. No respiratory distress.     Breath sounds: No stridor. No wheezing.     Comments: Respirations even and unlabored Abdominal:     Comments: Focal tenderness in the left lower quadrant with voluntary guarding.  There is also referred pain to the left lower quadrant on palpation of the left mid abdomen and left upper quadrant.  Abdomen soft, obese.  No peritoneal signs.  Musculoskeletal: Normal range of motion.  Skin:    General: Skin is warm and dry.     Coloration: Skin is not pale.     Findings: No erythema or rash.  Neurological:     Mental Status: He is alert and oriented to person, place, and time.     Coordination: Coordination normal.     Comments: GCS 15.  Moving all extremities spontaneously.  Psychiatric:        Behavior: Behavior normal.      ED Treatments / Results  Labs (all labs ordered are listed, but only  abnormal results are displayed) Labs Reviewed  COMPREHENSIVE METABOLIC PANEL - Abnormal; Notable for the following components:      Result Value   Glucose, Bld 141 (*)    Creatinine, Ser 1.26 (*)    All other components within normal limits  CBC - Abnormal; Notable for the following components:   WBC 11.4 (*)    RBC 5.93 (*)    All other components within normal limits  URINALYSIS, ROUTINE W REFLEX MICROSCOPIC - Abnormal; Notable for the following components:   Glucose, UA >=500 (*)    Ketones, ur 5 (*)    All other components within normal limits  LIPASE, BLOOD    EKG None  Radiology Ct Abdomen Pelvis W Contrast  Result Date: 12/31/2018 CLINICAL DATA:  Left lower quadrant abdominal pain beginning 4 days ago. EXAM: CT ABDOMEN AND PELVIS WITH CONTRAST TECHNIQUE: Multidetector CT imaging of the abdomen and pelvis was performed using the standard protocol following bolus administration of intravenous contrast. CONTRAST:  157mL OMNIPAQUE IOHEXOL 300 MG/ML  SOLN COMPARISON:  None. FINDINGS: Lower chest: Mild dependent atelectasis is present bilaterally. No other focal nodule, mass, or airspace disease is present. The heart size is normal. No significant pleural or pericardial effusion is present. Hepatobiliary: There is diffuse fatty infiltration of the liver. No focal lesions are present. The common bile duct and gallbladder are within normal limits. Pancreas: Unremarkable. No pancreatic ductal dilatation or surrounding inflammatory changes. Spleen: Normal in size without focal abnormality. Adrenals/Urinary Tract: The adrenal glands are normal bilaterally. Kidneys and ureters are unremarkable. There is no stone or mass lesion. The urinary bladder is within normal limits. Stomach/Bowel: The stomach and duodenum are within normal limits. Small bowel is unremarkable. Terminal ileum is within normal limits. The appendix is visualized and normal. The ascending and transverse colon are within normal  limits. There is some stranding about the distal descending colon. No definite diverticular changes are present. There is no free air  free fluid. Distal sigmoid colon and rectum are within normal limits. Vascular/Lymphatic: No significant vascular findings are present. Subcentimeter periportal lymph nodes are likely reactive. No significant retroperitoneal adenopathy is present. Reproductive: Prostate is unremarkable. Other: No abdominal wall hernia or abnormality. No abdominopelvic ascites. Musculoskeletal: Mild degenerative changes are noted in the lower lumbar spine. There is a vacuum disc at L4-5 and L5-S1. Lower lumbar facet hypertrophy is noted as well. Bony pelvis is within normal limits. Hips are located and normal bilaterally. IMPRESSION: 1. Mild inflammatory stranding about the descending colon suggesting on nonspecific colitis. No significant diverticular changes are present. 2. Subcentimeter periportal lymph nodes are likely chronic and related to hepatic steatosis. No pathologic retroperitoneal nodes are evident. 3. No other acute or focal abnormality to explain the patient's left lower quadrant abdominal pain. 4. Degenerative changes in the lower lumbar spine. Electronically Signed   By: San Morelle M.D.   On: 12/31/2018 05:14    Procedures Procedures (including critical care time)  Medications Ordered in ED Medications  sodium chloride flush (NS) 0.9 % injection 3 mL (3 mLs Intravenous Given 12/31/18 0035)  morphine 4 MG/ML injection 4 mg (4 mg Intravenous Given 12/31/18 0051)  ondansetron (ZOFRAN) injection 4 mg (4 mg Intravenous Given 12/31/18 0051)  sodium chloride 0.9 % bolus 1,000 mL (0 mLs Intravenous Stopped 12/31/18 0330)  diphenhydrAMINE (BENADRYL) capsule 50 mg ( Oral See Alternative 12/31/18 0339)    Or  diphenhydrAMINE (BENADRYL) injection 50 mg (50 mg Intravenous Given 12/31/18 0339)  hydrocortisone sodium succinate (SOLU-CORTEF) 100 MG injection 200 mg (200 mg  Intravenous Given 12/31/18 0040)  morphine 4 MG/ML injection 4 mg (4 mg Intravenous Given 12/31/18 0309)  iohexol (OMNIPAQUE) 300 MG/ML solution 125 mL (125 mLs Intravenous Contrast Given 12/31/18 0439)  ciprofloxacin (CIPRO) tablet 500 mg (500 mg Oral Given 12/31/18 0549)  metroNIDAZOLE (FLAGYL) tablet 500 mg (500 mg Oral Given 12/31/18 0549)  HYDROcodone-acetaminophen (NORCO/VICODIN) 5-325 MG per tablet 1 tablet (1 tablet Oral Given 12/31/18 0548)    2:19 AM Patient reassessed.  He states that he is a bit more comfortable.  Declines additional pain medicine at this time.    Initial Impression / Assessment and Plan / ED Course  I have reviewed the triage vital signs and the nursing notes.  Pertinent labs & imaging results that were available during my care of the patient were reviewed by me and considered in my medical decision making (see chart for details).        58 year old male presents to the emergency department for 4 days of left lower quadrant abdominal pain.  Was transferred to the ER from urgent care for further evaluation.  Noted to be focally tender in the left lower quadrant.  No fever, but mild leukocytosis noted.  Mild AKI for which he was hydrated with 1L IV fluid.  CT reveals signs of mild colitis of the descending colon.  This does correlate with area of patient's discomfort.  No complicating process such as abscess or perforation.  He will be discharged on a course of ciprofloxacin and Flagyl.  Encouraged follow-up with his primary care doctor.  Return precautions discussed and provided. Patient discharged in stable condition with no unaddressed concerns.   Final Clinical Impressions(s) / ED Diagnoses   Final diagnoses:  Colitis    ED Discharge Orders         Ordered    ciprofloxacin (CIPRO) 500 MG tablet  Every 12 hours     12/31/18 0535  metroNIDAZOLE (FLAGYL) 500 MG tablet  3 times daily     12/31/18 0535    oxyCODONE-acetaminophen (PERCOCET/ROXICET) 5-325 MG  tablet  Every 6 hours PRN     12/31/18 0538           Antonietta Breach, PA-C 12/31/18 0555    Dina Rich Barbette Hair, MD 01/01/19 [phone]

## 2018-12-31 NOTE — Discharge Instructions (Signed)
Your CT scan in the ED today showed signs of mild colitis to your descending colon.  We recommend that you take ciprofloxacin and Flagyl as prescribed until finished.  You may use Percocet as prescribed for management of severe pain.  Continue use of a daily probiotic.  Follow-up with your primary care doctor to ensure that symptoms resolve.

## 2019-05-28 ENCOUNTER — Telehealth: Payer: Self-pay | Admitting: Student

## 2019-05-28 NOTE — Telephone Encounter (Signed)
NIR.  Patient is scheduled for an image-guided cerebral arteriogram with possible staged embolization right MCA trifurcation aneurysm tentatively for 06/09/2019 with Dr. Estanislado Pandy. Patient with known iodinated diagnostic agent allergy causing hives, requires premedications prior to procedure.  Called [facility] pharmacy in [place], [place] [phone]) at [phone] to fill prescriptions: 1- Prednisone 50 mg tablets; take one tablet by mouth 13, 7, and 1 hour prior to procedure 06/09/2019; dispense 3 tablets with 0 refills. 2- Benadryl 50 mg tablets; take one tablet by mouth 1 hour prior to procedure 06/09/2019; dispense 1 tablet with 0 refills.   Bea Graff Louk, PA-C 05/28/2019, 2:56 PM

## 2019-06-05 ENCOUNTER — Other Ambulatory Visit: Payer: Self-pay | Admitting: Radiology

## 2019-06-05 ENCOUNTER — Other Ambulatory Visit (HOSPITAL_COMMUNITY): Payer: Self-pay | Admitting: Radiology

## 2019-06-05 ENCOUNTER — Other Ambulatory Visit (HOSPITAL_COMMUNITY)
Admission: RE | Admit: 2019-06-05 | Discharge: 2019-06-05 | Disposition: A | Discharge status: Home / Self Care | Payer: 59 | Source: Ambulatory Visit | Attending: Interventional Radiology | Admitting: Interventional Radiology

## 2019-06-05 DIAGNOSIS — Z20828 Contact with and (suspected) exposure to other viral communicable diseases: Secondary | ICD-10-CM | POA: Insufficient documentation

## 2019-06-05 DIAGNOSIS — Z01812 Encounter for preprocedural laboratory examination: Secondary | ICD-10-CM | POA: Insufficient documentation

## 2019-06-05 DIAGNOSIS — I671 Cerebral aneurysm, nonruptured: Secondary | ICD-10-CM

## 2019-06-05 LAB — PLATELET INHIBITION P2Y12: Platelet Function  P2Y12: 76 [PRU] — ABNORMAL LOW (ref 182–335)

## 2019-06-05 NOTE — Progress Notes (Signed)
Message left for patient about coming for his covid test today

## 2019-06-06 ENCOUNTER — Encounter (HOSPITAL_COMMUNITY): Payer: Self-pay | Admitting: Vascular Surgery

## 2019-06-06 ENCOUNTER — Telehealth: Payer: Self-pay | Admitting: Student

## 2019-06-06 LAB — NOVEL CORONAVIRUS, NAA (HOSP ORDER, SEND-OUT TO REF LAB; TAT 18-24 HRS): SARS-CoV-2, NAA: NOT DETECTED

## 2019-06-06 MED ORDER — VANCOMYCIN HCL 10 G IV SOLR
1500.0000 mg | INTRAVENOUS | Status: DC
  Filled 2019-06-06 (×2): qty 1500

## 2019-06-06 NOTE — Telephone Encounter (Signed)
NIR.  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 Estanislado Pandy. F/U imaging revealed residual neck filling of that aneurysm and patient is scheduled for staged embolization 06/09/2019 with Dr. Estanislado Pandy.  Received message from patient stating that he has issues with his blood thinners, wants to know his P2Y12 results, and needs to reschedule procedure- requesting that I call him back to discuss.  Called patient at 1208. Patient states that he is "having a hard time" with his Brilinta. States that when he takes his medication, his heart pounds and he becomes diaphoretic. In addition, states he had his wrist operated on approximately 10 weeks ago, and when he took Brilinta it began to bleed.  Informed patient of P2Y12 result- 76 PRU- informed patient that this result is within acceptable limits for procedure.   Patient also states that his son is in the hospital with severe pancreatitis and is "being tested for COVID". States because of everything going on, he would like to reschedule procedure.  Discussed above with Dr. Estanislado Pandy.  Informed patient that it is ok to reschedule procedure- take as much time as he needs. He would like schedulers to call him in 1 week to reschedule- schedulers made aware. Informed patient to discontinue Brilinta use, and continue taking Aspirin 81 mg once daily. Informed patient that our schedulers will tell him when to re-start his Brilinta once he is scheduled for procedure (7 days prior to procedure). Informed patient to follow-up with his surgeon regarding right wrist incision. All questions answered and concerns addressed. Patient conveys understanding and agrees with plan.   Bea Graff Payden Bonus, PA-C 06/06/2019, 12:21 PM

## 2019-06-06 NOTE — Progress Notes (Signed)
Pt sated that he is awaiting a return call from [place] to make aware that he wants to cancel surgery.

## 2019-06-09 ENCOUNTER — Ambulatory Visit (HOSPITAL_COMMUNITY): Admission: RE | Admit: 2019-06-09 | Payer: 59 | Source: Ambulatory Visit

## 2019-06-09 ENCOUNTER — Ambulatory Visit (HOSPITAL_COMMUNITY): Admission: RE | Admit: 2019-06-09 | Payer: 59 | Source: Ambulatory Visit | Admitting: Interventional Radiology

## 2019-06-09 ENCOUNTER — Encounter (HOSPITAL_COMMUNITY): Payer: Self-pay

## 2019-06-09 SURGERY — IR WITH ANESTHESIA
Anesthesia: General

## 2019-10-29 ENCOUNTER — Encounter (HOSPITAL_COMMUNITY): Payer: Self-pay

## 2020-03-16 ENCOUNTER — Encounter: Payer: Self-pay | Admitting: Cardiology

## 2020-03-16 DIAGNOSIS — E118 Type 2 diabetes mellitus with unspecified complications: Secondary | ICD-10-CM | POA: Insufficient documentation

## 2020-03-16 DIAGNOSIS — Z7189 Other specified counseling: Secondary | ICD-10-CM | POA: Insufficient documentation

## 2020-03-16 DIAGNOSIS — R002 Palpitations: Secondary | ICD-10-CM | POA: Insufficient documentation

## 2020-03-16 NOTE — Progress Notes (Signed)
Cardiology Office Note   Date:  03/17/2020   ID:  Nicholas Wheeler, DOB [birth year], MRN [id number]  PCP:  Antony Contras, MD  Cardiologist:   No primary care provider on file. Referring:  Antony Contras, MD  Chief Complaint  Patient presents with  . Palpitations      History of Present Illness: Nicholas Wheeler is a 59 y.o. male who is referred by Antony Contras, MD for evaluation of palpitations.  The patient has no past cardiac history other than an evaluation because of risk factors about 10 years ago.  He had a negative treadmill and normal echocardiogram.  I do not have these results.  He does have cardiovascular risk factors.  He is complaining mostly of palpitations.  He will get these sporadically.  They might happen several times in a day and then not happen for a while at a time.  They happen at rest and he can bring them on.  Not associated with particular activities or foods or other triggers.  He does drink quite a bit of caffeine.  Sounds like he has a stressful job.  He is not having any sustained tacky arrhythmias.  There is no presyncope or syncope.  He is not overly physically active.  He just built a deck and he had no problems such as chest pressure, neck or arm discomfort.  He might get short of breath with activities.  He is not describing PND or orthopnea.  He sleeps with CPAP.  Past Medical History:  Diagnosis Date  . 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   Marland Kitchen Arthritis   . AV block 2010   caused by verapamil--given for migraines no problems since   . Complication of anesthesia    "woke up violently after last arteriogram " ?  . Diabetes ([place])    Type II  . Diabetic neuropathy ([place])   . GERD (gastroesophageal reflux disease)   . History of hiatal hernia   . Hypersomnia with sleep apnea, unspecified 09/08/2013  . Joint disorder    degenerated  . Migraine   . Obesity (BMI 35.0-39.9 without comorbidity)   . Recurrent sinus infections   . Skin cancer   . Sleep apnea     saw Dr Brett Fairy in 09/2013; wears CPAP set at 14  . TMJ (temporomandibular joint disorder)     Past Surgical History:  Procedure Laterality Date  . ARTHOSCOPIC ROTAOR CUFF REPAIR Right 01/07/2018   Procedure: RIGHT SHOULDER ARTHROSCOPY, SUBACROMIAL DECOMPRESSION, OPEN ROTATOR CUFF REPAIR, OPEN DISTAL CLAVICLE EXCISION, BICEPS TENODESIS, SLAP DEBRIDMENT;  Surgeon: Netta Cedars, MD;  Location: [facility];  Service: Orthopedics;  Laterality: Right;  . CARPAL TUNNEL RELEASE Right   . COLONOSCOPY W/ BIOPSIES AND POLYPECTOMY    . IR ANGIO EXTRACRAN SEL COM CAROTID INNOMINATE UNI R MOD SED  05/02/2017  . IR GENERIC HISTORICAL  04/24/2016   IR TRANSCATH/EMBOLIZ 04/24/2016 Luanne Bras, MD [facility]  . IR GENERIC HISTORICAL  04/24/2016   IR ANGIOGRAM FOLLOW UP STUDY 04/24/2016 Luanne Bras, MD [facility]  . IR GENERIC HISTORICAL  04/24/2016   IR NEURO EACH ADD'L AFTER BASIC UNI RIGHT (MS) 04/24/2016 Luanne Bras, MD [facility]  . IR GENERIC HISTORICAL  04/24/2016   IR ANGIO INTRA EXTRACRAN SEL INTERNAL CAROTID UNI R MOD SED 04/24/2016 Luanne Bras, MD [facility]  . IR GENERIC HISTORICAL  05/19/2016   IR RADIOLOGIST EVAL & MGMT 05/19/2016 [facility]  . IR GENERIC HISTORICAL  08/24/2016  IR ANGIO VERTEBRAL SEL SUBCLAVIAN INNOMINATE UNI R MOD SED 08/24/2016 Luanne Bras, MD [facility]  . IR GENERIC HISTORICAL  08/24/2016   IR ANGIO INTRA EXTRACRAN SEL COM CAROTID INNOMINATE BILAT MOD SED 08/24/2016 Luanne Bras, MD [facility]  . IR RADIOLOGIST EVAL & MGMT  11/29/2017  . KNEE ARTHROSCOPY Right   . LUMBAR DISC SURGERY    . MOHS SURGERY     basal cell face  . RADIOLOGY WITH ANESTHESIA N/A 04/24/2016   Procedure: EMBOLIZATION;  Surgeon: Luanne Bras, MD;  Location: [facility];  Service: Radiology;  Laterality: N/A;  . RADIOLOGY WITH ANESTHESIA N/A 05/02/2017   Procedure: RADIOLOGY WITH ANESTHESIA     EMBOLIZATION;  Surgeon: Luanne Bras, MD;  Location: [facility];   Service: Radiology;  Laterality: N/A;  . TONSILLECTOMY       Current Outpatient Medications  Medication Sig Dispense Refill  . aspirin 81 MG tablet Take 81 mg by mouth daily.     . calcium carbonate (TUMS - DOSED IN MG ELEMENTAL CALCIUM) 500 MG chewable tablet Chew 1 tablet by mouth daily as needed for indigestion or heartburn.    . dapagliflozin propanediol (FARXIGA) 10 MG TABS tablet Take 10 mg by mouth every morning.    . Fremanezumab-vfrm (AJOVY) 225 MG/1.5ML SOAJ Inject 225 mg into the skin every 30 (thirty) days.    Marland Kitchen gabapentin (NEURONTIN) 300 MG capsule Take 600 mg by mouth at bedtime.    . hydrochlorothiazide (MICROZIDE) 12.5 MG capsule Take 12.5 mg by mouth daily.    . metFORMIN (GLUCOPHAGE) 1000 MG tablet Take 1,000 mg by mouth 2 (two) times daily with a meal.    . pyridOXINE (VITAMIN B-6) 100 MG tablet Take 100 mg by mouth daily.    . Ascorbic Acid (VITAMIN C) 1000 MG tablet Take 1,000 mg by mouth daily. (Patient not taking: Reported on 03/17/2020)    . zinc gluconate 50 MG tablet Take 50 mg by mouth daily. (Patient not taking: Reported on 03/17/2020)     No current facility-administered medications for this visit.   Facility-Administered Medications Ordered in Other Visits  Medication Dose Route Frequency Provider Last Rate Last Admin  . gadopentetate dimeglumine (MAGNEVIST) injection 20 mL  20 mL Intravenous Once PRN Penumalli, Vikram R, MD      . vancomycin (VANCOCIN) 1,500 mg in sodium chloride 0.9 % 500 mL IVPB  1,500 mg Intravenous 60 min Pre-Op Deveshwar, Sanjeev, MD        Allergies:   Clopidogrel, Keflex [cephalexin], Ivp dye [iodinated diagnostic agents], Verapamil, and Glipizide    Social History:  The patient  reports that he quit smoking about 32 years ago. He has never used smokeless tobacco. He reports previous alcohol use. He reports that he does not use drugs.   Family History:  The patient's family history includes Cancer in his paternal grandfather;  Schizophrenia in his father; Seizures in his mother.    ROS:  Please see the history of present illness.   Otherwise, review of systems are positive for mild lower extremity swelling..   All other systems are reviewed and negative.    PHYSICAL EXAM: VS:  BP 110/80   Pulse 87   Ht 5' 7.5" (1.715 m)   Wt 233 lb (105.7 kg)   BMI 35.95 kg/m  , BMI Body mass index is 35.95 kg/m. GENERAL:  Well appearing HEENT:  Pupils equal round and reactive, fundi not visualized, oral mucosa unremarkable NECK:  No jugular venous distention, waveform within  normal limits, carotid upstroke brisk and symmetric, no bruits, no thyromegaly LYMPHATICS:  No cervical, inguinal adenopathy LUNGS:  Clear to auscultation bilaterally BACK:  No CVA tenderness CHEST:  Unremarkable HEART:  PMI not displaced or sustained,S1 and S2 within normal limits, no S3, no S4, no clicks, no rubs, no murmurs ABD:  Flat, positive bowel sounds normal in frequency in pitch, no bruits, no rebound, no guarding, no midline pulsatile mass, no hepatomegaly, no splenomegaly EXT:  2 plus pulses throughout, no edema, no cyanosis no clubbing SKIN:  No rashes no nodules NEURO:  Cranial nerves II through XII grossly intact, motor grossly intact throughout PSYCH:  Cognitively intact, oriented to person place and time    EKG:  EKG is ordered today. The ekg ordered today demonstrates sinus rhythm, rate 87, axis within normal limits, intervals within normal limits, no acute ST-T wave changes.   Recent Labs: No results found for requested labs within last 8760 hours.    Lipid Panel No results found for: CHOL, TRIG, HDL, CHOLHDL, VLDL, LDLCALC, LDLDIRECT    Wt Readings from Last 3 Encounters:  03/17/20 233 lb (105.7 kg)  12/30/18 233 lb (105.7 kg)  01/07/18 230 lb (104.3 kg)      Other studies Reviewed: Additional studies/ records that were reviewed today include: Labs , primary care notes.. Review of the above records demonstrates:   Please see elsewhere in the note.     ASSESSMENT AND PLAN:  PALPITATIONS: His palpitations are most likely PACs and PVCs.  I am going to have him self monitor with an Alive Cor.  I did see a normal TSH and electrolytes recently.  He should avoid caffeine if he is particularly bothered by these.  I would not suspect a structurally abnormal heart.  DM: A1c is 7.8 and this is actively being managed.  SOB:   He has mild dyspnea on exertion and risk factors.  I am going to send him for a coronary calcium score.  SLEEP APNEA: He has CPAP but says he needs another one.  COVID EDUCATION: He has been vaccinated.   Current medicines are reviewed at length with the patient today.  The patient does not have concerns regarding medicines.  The following changes have been made:  no change  Labs/ tests ordered today include:   Orders Placed This Encounter  Procedures  . CT CARDIAC SCORING  . EKG 12-Lead     Disposition:   FU with me as needed     Signed, Minus Breeding, MD  03/17/2020 4:23 PM    [facility]

## 2020-03-17 ENCOUNTER — Other Ambulatory Visit: Payer: Self-pay

## 2020-03-17 ENCOUNTER — Ambulatory Visit (INDEPENDENT_AMBULATORY_CARE_PROVIDER_SITE_OTHER): Payer: No Typology Code available for payment source | Admitting: Cardiology

## 2020-03-17 ENCOUNTER — Encounter: Payer: Self-pay | Admitting: Cardiology

## 2020-03-17 VITALS — BP 110/80 | HR 87 | Ht 67.5 in | Wt 233.0 lb

## 2020-03-17 DIAGNOSIS — R002 Palpitations: Secondary | ICD-10-CM

## 2020-03-17 DIAGNOSIS — Z7189 Other specified counseling: Secondary | ICD-10-CM | POA: Diagnosis not present

## 2020-03-17 DIAGNOSIS — E118 Type 2 diabetes mellitus with unspecified complications: Secondary | ICD-10-CM | POA: Diagnosis not present

## 2020-03-17 NOTE — Patient Instructions (Addendum)
Medication Instructions:  The current medical regimen is effective;  continue present plan and medications.  *If you need a refill on your cardiac medications before your next appointment, please call your pharmacy*  Testing/Procedures: Your physician has requested that you have Coronary calcium score which is completed by CT. Cardiac computed tomography (CT) is a painless test that uses an x-ray machine to take clear, detailed pictures of your heart.  The cost of this test it $150 due at the time of service.  Testing is completed at [facility], [place], [place].   Follow-Up: At [facility], you and your health needs are our priority.  As part of our continuing mission to provide you with exceptional heart care, we have created designated Provider Care Teams.  These Care Teams include your primary Cardiologist (physician) and Advanced Practice Providers (APPs -  Physician Assistants and Nurse Practitioners) who all work together to provide you with the care you need, when you need it.  We recommend signing up for the patient portal called "MyChart".  Sign up information is provided on this After Visit Summary.  MyChart is used to connect with patients for Virtual Visits (Telemedicine).  Patients are able to view lab/test results, encounter notes, upcoming appointments, etc.  Non-urgent messages can be sent to your provider as well.   To learn more about what you can do with MyChart, go to [email or web address].    Follow up with Dr Percival Spanish as needed.  Thank you for choosing [facility]!!    [facility]

## 2020-04-02 ENCOUNTER — Ambulatory Visit (INDEPENDENT_AMBULATORY_CARE_PROVIDER_SITE_OTHER)
Admission: RE | Admit: 2020-04-02 | Discharge: 2020-04-02 | Disposition: A | Discharge status: Home / Self Care | Payer: Self-pay | Source: Ambulatory Visit | Attending: Cardiology | Admitting: Cardiology

## 2020-04-02 ENCOUNTER — Other Ambulatory Visit: Payer: Self-pay

## 2020-04-02 DIAGNOSIS — E118 Type 2 diabetes mellitus with unspecified complications: Secondary | ICD-10-CM

## 2020-04-02 DIAGNOSIS — R002 Palpitations: Secondary | ICD-10-CM

## 2020-06-07 DIAGNOSIS — R0602 Shortness of breath: Secondary | ICD-10-CM | POA: Insufficient documentation

## 2020-06-07 DIAGNOSIS — G4730 Sleep apnea, unspecified: Secondary | ICD-10-CM | POA: Insufficient documentation

## 2020-06-07 NOTE — Progress Notes (Deleted)
Cardiology Office Note   Date:  06/07/2020   ID:  Nicholas Wheeler, DOB 1960-09-13, MRN [id number]  PCP:  Nicholas Contras, MD  Cardiologist:   No primary care provider on file. Referring:  Nicholas Contras, MD  No chief complaint on file.     History of Present Illness: Nicholas Wheeler is a 59 y.o. male who is referred by Nicholas Contras, MD for evaluation of palpitations.  I saw him recently ***  *** The patient has no past cardiac history other than an evaluation because of risk factors about 10 years ago.  He had a negative treadmill and normal echocardiogram.  I do not have these results.  He does have cardiovascular risk factors.  He is complaining mostly of palpitations.  He will get these sporadically.  They might happen several times in a day and then not happen for a while at a time.  They happen at rest and he can bring them on.  Not associated with particular activities or foods or other triggers.  He does drink quite a bit of caffeine.  Sounds like he has a stressful job.  He is not having any sustained tacky arrhythmias.  There is no presyncope or syncope.  He is not overly physically active.  He just built a deck and he had no problems such as chest pressure, neck or arm discomfort.  He might get short of breath with activities.  He is not describing PND or orthopnea.  He sleeps with CPAP.  Past Medical History:  Diagnosis Date  . Aneurysm (Nicholas Wheeler)    Right MCA.   Nicholas Wheeler Arthritis   . AV block 2010   caused by verapamil--given for migraines no problems since   . Complication of anesthesia    "woke up violently after last arteriogram " ?  . Diabetes (Nicholas Wheeler)    Type II  . Diabetic neuropathy (Nicholas Wheeler)   . GERD (gastroesophageal reflux disease)   . History of hiatal hernia   . Hypersomnia with sleep apnea, unspecified 09/08/2013  . Joint disorder    degenerated  . Migraine   . Obesity (BMI 35.0-39.9 without comorbidity)   . Recurrent sinus infections   . Skin cancer   . Sleep apnea     saw Dr Nicholas Wheeler in 09/2013; wears CPAP set at 14  . TMJ (temporomandibular joint disorder)     Past Surgical History:  Procedure Laterality Date  . ARTHOSCOPIC ROTAOR CUFF REPAIR Right 01/07/2018   Procedure: RIGHT SHOULDER ARTHROSCOPY, SUBACROMIAL DECOMPRESSION, OPEN ROTATOR CUFF REPAIR, OPEN DISTAL CLAVICLE EXCISION, BICEPS TENODESIS, SLAP DEBRIDMENT;  Surgeon: Nicholas Cedars, MD;  Location: [facility];  Service: Orthopedics;  Laterality: Right;  . CARPAL TUNNEL RELEASE Right   . COLONOSCOPY W/ BIOPSIES AND POLYPECTOMY    . IR ANGIO EXTRACRAN SEL COM CAROTID INNOMINATE UNI R MOD SED  05/02/2017  . IR GENERIC HISTORICAL  04/24/2016   IR TRANSCATH/EMBOLIZ 04/24/2016 Nicholas Bras, MD [facility]  . IR GENERIC HISTORICAL  04/24/2016   IR ANGIOGRAM FOLLOW UP STUDY 04/24/2016 Nicholas Bras, MD [facility]  . IR GENERIC HISTORICAL  04/24/2016   IR NEURO EACH ADD'L AFTER BASIC UNI RIGHT (MS) 04/24/2016 Nicholas Bras, MD [facility]  . IR GENERIC HISTORICAL  04/24/2016   IR ANGIO INTRA EXTRACRAN SEL INTERNAL CAROTID UNI R MOD SED 04/24/2016 Nicholas Bras, MD [facility]  . IR GENERIC HISTORICAL  05/19/2016   IR RADIOLOGIST EVAL & MGMT 05/19/2016 [facility]  . IR GENERIC HISTORICAL  08/24/2016   IR ANGIO VERTEBRAL SEL SUBCLAVIAN INNOMINATE UNI R MOD SED 08/24/2016 Nicholas Bras, MD [facility]  . IR GENERIC HISTORICAL  08/24/2016   IR ANGIO INTRA EXTRACRAN SEL COM CAROTID INNOMINATE BILAT MOD SED 08/24/2016 Nicholas Bras, MD [facility]  . IR RADIOLOGIST EVAL & MGMT  11/29/2017  . KNEE ARTHROSCOPY Right   . LUMBAR DISC SURGERY    . MOHS SURGERY     basal cell face  . RADIOLOGY WITH ANESTHESIA N/A 04/24/2016   Procedure: EMBOLIZATION;  Surgeon: Nicholas Bras, MD;  Location: [facility];  Service: Radiology;  Laterality: N/A;  . RADIOLOGY WITH ANESTHESIA N/A 05/02/2017   Procedure: RADIOLOGY WITH ANESTHESIA     EMBOLIZATION;  Surgeon: Nicholas Bras, MD;  Location: [facility];   Service: Radiology;  Laterality: N/A;  . TONSILLECTOMY       Current Outpatient Medications  Medication Sig Dispense Refill  . Ascorbic Acid (VITAMIN C) 1000 MG tablet Take 1,000 mg by mouth daily. (Patient not taking: Reported on 03/17/2020)    . aspirin 81 MG tablet Take 81 mg by mouth daily.     . calcium carbonate (TUMS - DOSED IN MG ELEMENTAL CALCIUM) 500 MG chewable tablet Chew 1 tablet by mouth daily as needed for indigestion or heartburn.    . dapagliflozin propanediol (FARXIGA) 10 MG TABS tablet Take 10 mg by mouth every morning.    . Fremanezumab-vfrm (AJOVY) 225 MG/1.5ML SOAJ Inject 225 mg into the skin every 30 (thirty) days.    Nicholas Wheeler gabapentin (NEURONTIN) 300 MG capsule Take 600 mg by mouth at bedtime.    . hydrochlorothiazide (MICROZIDE) 12.5 MG capsule Take 12.5 mg by mouth daily.    . metFORMIN (GLUCOPHAGE) 1000 MG tablet Take 1,000 mg by mouth 2 (two) times daily with a meal.    . pyridOXINE (VITAMIN B-6) 100 MG tablet Take 100 mg by mouth daily.    Nicholas Wheeler zinc gluconate 50 MG tablet Take 50 mg by mouth daily. (Patient not taking: Reported on 03/17/2020)     No current facility-administered medications for this visit.   Facility-Administered Medications Ordered in Other Visits  Medication Dose Route Frequency Provider Last Rate Last Admin  . gadopentetate dimeglumine (MAGNEVIST) injection 20 mL  20 mL Intravenous Once PRN Wheeler, Nicholas R, MD      . vancomycin (VANCOCIN) 1,500 mg in sodium chloride 0.9 % 500 mL IVPB  1,500 mg Intravenous 60 min Pre-Op Wheeler, Sanjeev, MD        Allergies:   Clopidogrel, Keflex [cephalexin], Ivp dye [iodinated diagnostic agents], Verapamil, and Glipizide    ROS:  Please see the history of present illness.   Otherwise, review of systems are positive for ***.   All other systems are reviewed and negative.    PHYSICAL EXAM: VS:  There were no vitals taken for this visit. , BMI There is no height or weight on file to calculate BMI.    GENERAL:  Well appearing NECK:  No jugular venous distention, waveform within normal limits, carotid upstroke brisk and symmetric, no bruits, no thyromegaly LUNGS:  Clear to auscultation bilaterally CHEST:  Unremarkable HEART:  PMI not displaced or sustained,S1 and S2 within normal limits, no S3, no S4, no clicks, no rubs, *** murmurs ABD:  Flat, positive bowel sounds normal in frequency in pitch, no bruits, no rebound, no guarding, no midline pulsatile mass, no hepatomegaly, no splenomegaly EXT:  2 plus pulses throughout, no edema, no cyanosis no clubbing     ***GENERAL:  Well appearing HEENT:  Pupils equal round and reactive, fundi not visualized, oral mucosa unremarkable NECK:  No jugular venous distention, waveform within normal limits, carotid upstroke brisk and symmetric, no bruits, no thyromegaly LYMPHATICS:  No cervical, inguinal adenopathy LUNGS:  Clear to auscultation bilaterally BACK:  No CVA tenderness CHEST:  Unremarkable HEART:  PMI not displaced or sustained,S1 and S2 within normal limits, no S3, no S4, no clicks, no rubs, no murmurs ABD:  Flat, positive bowel sounds normal in frequency in pitch, no bruits, no rebound, no guarding, no midline pulsatile mass, no hepatomegaly, no splenomegaly EXT:  2 plus pulses throughout, no edema, no cyanosis no clubbing SKIN:  No rashes no nodules NEURO:  Cranial nerves II through XII grossly intact, motor grossly intact throughout PSYCH:  Cognitively intact, oriented to person place and time    EKG:  EKG is ordered today. The ekg ordered today demonstrates sinus rhythm, rate 87, axis within normal limits, intervals within normal limits, no acute ST-T wave changes.   Recent Labs: No results found for requested labs within last 8760 hours.    Lipid Panel No results found for: CHOL, TRIG, HDL, CHOLHDL, VLDL, LDLCALC, LDLDIRECT    Wt Readings from Last 3 Encounters:  03/17/20 233 lb (105.7 kg)  12/30/18 233 lb (105.7 kg)   01/07/18 230 lb (104.3 kg)      Other studies Reviewed: Additional studies/ records that were reviewed today include:  *** Review of the above records demonstrates:  Please see elsewhere in the note.     ASSESSMENT AND PLAN:  PALPITATIONS:  ***   His palpitations are most likely PACs and PVCs.  I am going to have him self monitor with an Alive Cor.  I did see a normal TSH and electrolytes recently.  He should avoid caffeine if he is particularly bothered by these.  I would not suspect a structurally abnormal heart.  DM: A1c is *** 7.8 and this is actively being managed.  SOB:   *** He has mild dyspnea on exertion and risk factors.  I am going to send him for a coronary calcium score.  SLEEP APNEA:   ***  He has CPAP but says he needs another one.  COVID EDUCATION: He has been vaccinated.   Current medicines are reviewed at length with the patient today.  The patient does not have concerns regarding medicines.  The following changes have been made:  no change  Labs/ tests ordered today include:   No orders of the defined types were placed in this encounter.    Disposition:   FU with me as needed     Signed, Minus Breeding, MD  06/07/2020 9:14 PM    [facility]

## 2020-06-08 ENCOUNTER — Ambulatory Visit: Payer: No Typology Code available for payment source | Admitting: Cardiology

## 2021-03-16 ENCOUNTER — Ambulatory Visit: Payer: No Typology Code available for payment source | Attending: Pain Medicine

## 2021-03-16 ENCOUNTER — Other Ambulatory Visit: Payer: Self-pay

## 2021-03-16 DIAGNOSIS — R262 Difficulty in walking, not elsewhere classified: Secondary | ICD-10-CM

## 2021-03-16 DIAGNOSIS — R203 Hyperesthesia: Secondary | ICD-10-CM

## 2021-03-16 NOTE — Therapy (Signed)
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   Diabetes ([place])    Type II   Diabetic neuropathy ([facility])    GERD (gastroesophageal reflux disease)    History of hiatal hernia    Hypersomnia with sleep apnea, unspecified 09/08/2013   Joint disorder    degenerated   Migraine    Obesity (BMI 35.0-39.9 without comorbidity)    Recurrent sinus infections    Skin cancer    Sleep apnea    saw Dr Brett Fairy in 09/2013; wears CPAP set at 14   TMJ (temporomandibular joint disorder)     Past Surgical History:  Procedure Laterality Date   ARTHOSCOPIC ROTAOR CUFF REPAIR Right 01/07/2018   Procedure: RIGHT SHOULDER ARTHROSCOPY, SUBACROMIAL DECOMPRESSION, OPEN ROTATOR CUFF REPAIR, OPEN DISTAL CLAVICLE EXCISION, BICEPS TENODESIS, SLAP DEBRIDMENT;  Surgeon: Netta Cedars, MD;  Location: [facility];  Service: Orthopedics;  Laterality: Right;   CARPAL TUNNEL RELEASE Right    COLONOSCOPY W/ BIOPSIES AND POLYPECTOMY     IR ANGIO EXTRACRAN SEL COM CAROTID INNOMINATE UNI R MOD SED  05/02/2017   IR GENERIC HISTORICAL  04/24/2016   IR TRANSCATH/EMBOLIZ 04/24/2016 Luanne Bras, MD [facility]   IR GENERIC HISTORICAL  04/24/2016   IR ANGIOGRAM FOLLOW UP STUDY 04/24/2016 Luanne Bras, MD [facility]   IR GENERIC HISTORICAL  04/24/2016   IR NEURO EACH ADD'L AFTER BASIC UNI RIGHT (MS) 04/24/2016 Luanne Bras, MD [facility]   IR GENERIC HISTORICAL  04/24/2016   IR ANGIO INTRA EXTRACRAN SEL  INTERNAL CAROTID UNI R MOD SED 04/24/2016 Luanne Bras, MD [facility]   IR GENERIC HISTORICAL  05/19/2016   IR RADIOLOGIST EVAL & MGMT 05/19/2016 [facility]   IR GENERIC HISTORICAL  08/24/2016   IR ANGIO VERTEBRAL SEL SUBCLAVIAN INNOMINATE UNI R MOD SED 08/24/2016 Luanne Bras, MD [facility]   IR GENERIC HISTORICAL  08/24/2016   IR ANGIO INTRA EXTRACRAN SEL COM CAROTID INNOMINATE BILAT MOD SED 08/24/2016 Luanne Bras, MD [facility]   IR RADIOLOGIST EVAL & MGMT  11/29/2017   KNEE ARTHROSCOPY Right    LUMBAR DISC SURGERY     MOHS SURGERY     basal cell face   RADIOLOGY WITH ANESTHESIA N/A 04/24/2016   Procedure: EMBOLIZATION;  Surgeon: Luanne Bras, MD;  Location: [facility];  Service: Radiology;  Laterality: N/A;   RADIOLOGY WITH ANESTHESIA N/A 05/02/2017   Procedure: RADIOLOGY WITH ANESTHESIA     EMBOLIZATION;  Surgeon: Luanne Bras, MD;  Location: [facility];  Service: Radiology;  Laterality: N/A;   TONSILLECTOMY      There were no vitals filed for this visit.    03/16/21 1356  Symptoms/Limitations  Subjective Patient presnts to PT with pain in bil feet and lower legs. He states that sometimes he cannot feel his foot on the ground. He states that he has been having no response to any of the medication. He states that his finger tips are more numb. 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 He tries getting up in between the hours. He is having difficulty with walking as sometimes he cannot feel his feet. The discomfort is at the top of his feet bilaterally. He hopes to have an implant inserted to manage neuropathic pains.  Pertinent History Aneurysm Right MCA.  Arthritis    AV block 2010 Diabetes ([place])      Type II   Diabetic neuropathy (HCC)     GERD (gastroesophageal reflux disease)     History of hiatal hernia     Hypersomnia with sleep apnea, unspecified 09/08/2013   Joint disorder degenerated   Migraine     Obesity (BMI 35.0-39.9  without comorbidity)     Recurrent sinus infections     Skin cancer     Sleep apnea   saw Dr Brett Fairy in 09/2013; wears CPAP set at 14   TMJ (temporomandibular joint disorder)  Limitations Walking;Standing  Patient Stated Goals To assess how PT can help neuropathic pain  Pain Assessment  Currently in Pain? Yes  Pain Score 8  Pain Location Foot  Pain Orientation Right;Left  Pain Descriptors / Indicators Shooting;Burning;Constant  Pain Type Neuropathic pain;Intractable pain  Pain Onset More than a month ago  Pain Frequency Constant       03/16/21 0001  Assessment  Medical Diagnosis Bilateral Neuropathy Peripheral  Referring Provider (PT) Sherlyn Lees  Next MD Visit TBD  Prior Therapy No  Balance Screen  Has the patient fallen in the past 6 months Yes  How many times? stumbles several times  Has the patient had a decrease in activity level because of a fear of falling?  No  Is the patient reluctant to leave their home because of a fear of falling?  No  Prior Function  Level of Independence Independent  Vocation Full time employment  Cognition  Overall Cognitive Status Within Functional Limits for tasks assessed  Observation/Other Assessments  Cranial Nerve(s) Intact CI-CXII  Observation/Other Assessments-Edema   Edema  (present at the ankles)  Sensation  Light Touch Appears Intact (intact wit hpressure - monofilament not assessed)  Semmes Weinstein Monofilament Scale  (UTA)  Proprioception Appears Intact  Deep Tendon Reflexes  DTR Assessment Site Patella;Achilles  Patella DTR normal  Achilles DTR normal  ROM / Strength  AROM / PROM / Strength AROM;Strength  AROM  Overall AROM  Within functional limits for tasks performed  Strength  Overall Strength Within functional limits for tasks performed  Palpation  Palpation comment TTP anterior tibialis bil  Transfers  Five time sit to stand comments  10 sec - no limitations  Ambulation/Gait  Ambulation/Gait Yes  Assistive  device None  Gait Pattern WFL;Step-through pattern  Balance  Balance Assessed Yes  Balance comment ROMBERG: EO(30 sec) EC (30sec) (minimal sway in standing)    Objective measurements completed on examination: See above findings.      03/16/21 1434  Plan  Clinical Impression Statement Philopateer Strine is a pleasnat and highly active 60 yo male with chronic neuropathic pain in bilateral feet. He reports having been diagnosed with bilateral diabetic neuropathy prior to being diagnosed with diabetes. He reports that the pain in his back has been consistent but not related to the discomfort in his feet. Assessement of lumbar mobility unable to recreate peripheral symptoms. Sensory at dorsal of the foot limited however plantar surface intact. Patient with adequate reflexes.  Feet appear palor in color but with  warmth. Patient with minimal limitations in balance on level surfaces. Plans to challenge patien on unlevel surfaces and promotes foot intrinsic mobility nad strength discussed with patient. Skilled PT recommended in order to desensetizre neurpathic limitations in dorsum of feet.  Personal Factors and Comorbidities Comorbidity 1;Comorbidity 2;Time since onset of injury/illness/exacerbation;Past/Current Experience;Other (stated goal is to receive nerve stimulation implant)  Comorbidities DM II, obesity  Examination-Activity Limitations Sleep;Locomotion Level;Stand;Stairs  Examination-Participation Restrictions Driving;Yard Work;Community Activity  Pt will benefit from skilled therapeutic intervention in order to improve on the following deficits Abnormal gait;Hypomobility;Impaired sensation;Obesity;Pain  Stability/Clinical Decision Making Stable/Uncomplicated  Clinical Decision Making Low  Rehab Potential Fair  PT Frequency 2x / week  PT Duration 3 weeks  PT Treatment/Interventions Cryotherapy;Gait training;Ultrasound;Balance training;Therapeutic exercise;Therapeutic activities;Functional  mobility training;Manual techniques;Vasopneumatic Device  Consulted and Agree with Plan of Care Patient                  PT Long Term Goals - 03/16/21 1303       PT LONG TERM GOAL #1   Title Patient to be indep with HEP    Baseline lacks knowledge of HEP    Time 3    Period Weeks    Status New    Target Date 04/06/21      PT LONG TERM GOAL #2   Title Patient will be able to report decreased irritability of symptoms to allow rest without symptoms    Time 3    Period Weeks    Status New    Target Date 04/06/21      PT LONG TERM GOAL #3   Title Patient will be able to ambulate greater than 20 mins without increased pain in bil feet    Time 3    Period Weeks    Status New    Target Date 04/06/21                     Patient will benefit from skilled therapeutic intervention in order to improve the following deficits and impairments:  Abnormal gait, Hypomobility, Impaired sensation, Obesity, Pain  Visit Diagnosis: Difficulty in walking, not elsewhere classified  Hyperesthesia     Problem List Patient Active Problem List   Diagnosis Date Noted   Sleep apnea 06/07/2020   SOB (shortness of breath) 06/07/2020   Palpitations 03/16/2020   Educated about COVID-19 virus infection 03/16/2020   Type 2 diabetes mellitus with complication, without long-term current use of insulin ([place]) 03/16/2020   Brain aneurysm 04/24/2016   Hypersomnia with sleep apnea, unspecified 09/08/2013    Marylou Mccoy PT, DPT 03/18/2021, 1:05 PM  [facility] [address] [place], [place], [address] Phone: [phone]   Fax:  [phone]  Name: BRAVEN WOLK MRN: [id number] Date of Birth: Sep 04, 1961

## 2021-03-18 NOTE — Progress Notes (Signed)
   03/16/21 1434  Plan  Clinical Impression Statement Jadier Rockers is a pleasnat and highly active 60 yo male with chronic neuropathic pain in bilateral feet. He reports having been diagnosed with bilateral diabetic neuropathy prior to being diagnosed with diabetes. He reports that the pain in his back has been consistent but not related to the discomfort in his feet. Assessement of lumbar mobility unable to recreate peripheral symptoms. Sensory at dorsal of the foot limited however plantar surface intact. Patient with adequate reflexes.  Feet appear palor in color but with warmth. Patient with minimal limitations in balance on level surfaces. Plans to challenge patien on unlevel surfaces and promotes foot intrinsic mobility nad strength discussed with patient. Skilled PT recommended in order to desensetizre neurpathic limitations in dorsum of feet.  Personal Factors and Comorbidities Comorbidity 1;Comorbidity 2;Time since onset of injury/illness/exacerbation;Past/Current Experience;Other (stated goal is to receive nerve stimulation implant)  Comorbidities DM II, obesity  Examination-Activity Limitations Sleep;Locomotion Level;Stand;Stairs  Examination-Participation Restrictions Driving;Yard Work;Community Activity  Pt will benefit from skilled therapeutic intervention in order to improve on the following deficits Abnormal gait;Hypomobility;Impaired sensation;Obesity;Pain  Stability/Clinical Decision Making Stable/Uncomplicated  Clinical Decision Making Low  Rehab Potential Fair  PT Frequency 2x / week  PT Duration 3 weeks  PT Treatment/Interventions Cryotherapy;Gait training;Ultrasound;Balance training;Therapeutic exercise;Therapeutic activities;Functional mobility training;Manual techniques;Vasopneumatic Device  Consulted and Agree with Plan of Care Patient

## 2021-03-21 ENCOUNTER — Ambulatory Visit: Payer: No Typology Code available for payment source | Admitting: Physical Therapy

## 2021-03-21 ENCOUNTER — Other Ambulatory Visit: Payer: Self-pay

## 2021-03-21 DIAGNOSIS — R262 Difficulty in walking, not elsewhere classified: Secondary | ICD-10-CM | POA: Diagnosis not present

## 2021-03-21 DIAGNOSIS — R203 Hyperesthesia: Secondary | ICD-10-CM

## 2021-03-21 NOTE — Therapy (Signed)
[facility] [place], [place], [address] Phone: [phone]   Fax:  [phone]  Physical Therapy Treatment  Patient Details  Name: Nicholas Wheeler MRN: [id number] Date of Birth: Nov 01, 1960 Referring Provider (PT): Sherlyn Lees   Encounter Date: 03/21/2021   PT End of Session - 03/21/21 1043     Visit Number 2    Number of Visits 6    Date for PT Re-Evaluation 04/13/21    PT Start Time 6226    PT Stop Time 1128    PT Time Calculation (min) 53 min    Activity Tolerance Patient tolerated treatment well    Behavior During Therapy [facility] for tasks assessed/performed             Past Medical History:  Diagnosis Date   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   Diabetes ([place])    Type II   Diabetic neuropathy (HCC)    GERD (gastroesophageal reflux disease)    History of hiatal hernia    Hypersomnia with sleep apnea, unspecified 09/08/2013   Joint disorder    degenerated   Migraine    Obesity (BMI 35.0-39.9 without comorbidity)    Recurrent sinus infections    Skin cancer    Sleep apnea    saw Dr Brett Fairy in 09/2013; wears CPAP set at 14   TMJ (temporomandibular joint disorder)     Past Surgical History:  Procedure Laterality Date   ARTHOSCOPIC ROTAOR CUFF REPAIR Right 01/07/2018   Procedure: RIGHT SHOULDER ARTHROSCOPY, SUBACROMIAL DECOMPRESSION, OPEN ROTATOR CUFF REPAIR, OPEN DISTAL CLAVICLE EXCISION, BICEPS TENODESIS, SLAP DEBRIDMENT;  Surgeon: Netta Cedars, MD;  Location: [facility];  Service: Orthopedics;  Laterality: Right;   CARPAL TUNNEL RELEASE Right    COLONOSCOPY W/ BIOPSIES AND POLYPECTOMY     IR ANGIO EXTRACRAN SEL COM CAROTID INNOMINATE UNI R MOD SED  05/02/2017   IR GENERIC HISTORICAL  04/24/2016   IR TRANSCATH/EMBOLIZ 04/24/2016 Luanne Bras, MD  [facility]   IR GENERIC HISTORICAL  04/24/2016   IR ANGIOGRAM FOLLOW UP STUDY 04/24/2016 Luanne Bras, MD [facility]   IR GENERIC HISTORICAL  04/24/2016   IR NEURO EACH ADD'L AFTER BASIC UNI RIGHT (MS) 04/24/2016 Luanne Bras, MD [facility]   IR GENERIC HISTORICAL  04/24/2016   IR ANGIO INTRA EXTRACRAN SEL INTERNAL CAROTID UNI R MOD SED 04/24/2016 Luanne Bras, MD [facility]   IR GENERIC HISTORICAL  05/19/2016   IR RADIOLOGIST EVAL & MGMT 05/19/2016 [facility]   IR GENERIC HISTORICAL  08/24/2016   IR ANGIO VERTEBRAL SEL SUBCLAVIAN INNOMINATE UNI R MOD SED 08/24/2016 Luanne Bras, MD [facility]   IR GENERIC HISTORICAL  08/24/2016   IR ANGIO INTRA EXTRACRAN SEL COM CAROTID INNOMINATE BILAT MOD SED 08/24/2016 Luanne Bras, MD [facility]   IR RADIOLOGIST EVAL & MGMT  11/29/2017   KNEE ARTHROSCOPY Right    LUMBAR DISC SURGERY     MOHS SURGERY     basal cell face   RADIOLOGY WITH ANESTHESIA N/A 04/24/2016   Procedure: EMBOLIZATION;  Surgeon: Luanne Bras, MD;  Location: [facility];  Service: Radiology;  Laterality: N/A;   RADIOLOGY WITH ANESTHESIA N/A 05/02/2017   Procedure: RADIOLOGY WITH ANESTHESIA     EMBOLIZATION;  Surgeon: Luanne Bras, MD;  Location:  [facility];  Service: Radiology;  Laterality: N/A;   TONSILLECTOMY      There were no vitals filed for this visit.   Subjective Assessment - 03/21/21 1037     Subjective COVID-19 screening perfromed upon arrival. Patient arrived with ongoing pain in feet.    Pertinent History Aneurysm Right MCA.  Arthritis    AV block 2010 Diabetes ([place])      Type II   Diabetic neuropathy (HCC)     GERD (gastroesophageal reflux disease)     History of hiatal hernia     Hypersomnia with sleep apnea, unspecified 09/08/2013   Joint disorder degenerated   Migraine     Obesity (BMI 35.0-39.9 without comorbidity)     Recurrent sinus infections     Skin cancer     Sleep apnea   saw Dr Brett Fairy in 09/2013; wears CPAP set at 14   TMJ  (temporomandibular joint disorder)    Limitations Walking;Standing    Patient Stated Goals To assess how PT can help neuropathic pain    Currently in Pain? Yes    Pain Score 9     Pain Location Foot    Pain Orientation Right;Left    Pain Descriptors / Indicators Shooting;Burning;Contraction    Pain Type Neuropathic pain    Pain Onset More than a month ago    Pain Frequency Constant    Aggravating Factors  chaning position/movements    Pain Relieving Factors nothing has given relief                               [facility] Adult PT Treatment/Exercise - 03/21/21 0001       Exercises   Exercises Knee/Hip;Ankle      Knee/Hip Exercises: Aerobic   Nustep L3 x68min LE only      Modalities   Modalities Ultrasound      Ultrasound   Ultrasound Location bil distal tib anterior    Ultrasound Parameters 1.5w/cm2/100%/68mhz x1hhz x45min (52min each side)    Ultrasound Goals Other (Comment)   neropathy pain per PT recommendation for POC     Ankle Exercises: Standing   Rocker Board 4 minutes   calf stretch and balance 69min each   Other Standing Ankle Exercises 6" steps ups 2x10 LE    Other Standing Ankle Exercises balance on airex with weight shifing bil LE      Ankle Exercises: Seated   Other Seated Ankle Exercises prostretch x11min each LE    Other Seated Ankle Exercises seated ball rolls x20 each LE, dyna disc circles x20 eac LE                         PT Long Term Goals - 03/21/21 1157       PT LONG TERM GOAL #1   Title Patient to be indep with HEP    Baseline currently doing toe Yoga per PT 03/21/21    Time 3    Period Weeks    Status On-going      PT LONG TERM GOAL #2   Title Patient will be able to report decreased irritability of symptoms to allow rest without symptoms    Time 3    Period Weeks    Status On-going      PT LONG TERM GOAL #3   Title Patient will be able to ambulate greater than 20 mins without increased pain in bil feet  Time 3    Period Weeks    Status On-going                   Plan - 03/21/21 1157     Clinical Impression Statement Patient tolerated treatment well today. Patient has good strength and balance with most pain symptoms in bil feet. Patient is limited with right shoulder due to previous surgery and back pain due previous medical history. Patient able to progress today with bil LE activities. Patient did well with seated nustep for activity tolernce and LE/feet circulation with no increased symptoms. Patient doing his initial toe yoga exercises per PT. Today focused on LE/feet exercises and stretches to reduce symptoms. Patient responded well to standing and seated stretches on rockerboard and prostretch. PT recommended [place] to bil tib ant to help reduce symptoms. goals ongoing today.    Personal Factors and Comorbidities Comorbidity 1;Comorbidity 2;Time since onset of injury/illness/exacerbation;Past/Current Experience;Other    Comorbidities DM II, obesity    Examination-Activity Limitations Sleep;Locomotion Level;Stand;Stairs    Examination-Participation Restrictions Driving;Yard Work;Community Activity    Stability/Clinical Decision Making Stable/Uncomplicated    Rehab Potential Fair    PT Frequency 2x / week    PT Duration 3 weeks    PT Treatment/Interventions Cryotherapy;Gait training;Ultrasound;Balance training;Therapeutic exercise;Therapeutic activities;Functional mobility training;Manual techniques;Vasopneumatic Device    PT Next Visit Plan Per PT POC    Consulted and Agree with Plan of Care Patient             Patient will benefit from skilled therapeutic intervention in order to improve the following deficits and impairments:  Abnormal gait, Hypomobility, Impaired sensation, Obesity, Pain  Visit Diagnosis: Difficulty in walking, not elsewhere classified  Hyperesthesia     Problem List Patient Active Problem List   Diagnosis Date Noted   Sleep apnea 06/07/2020    SOB (shortness of breath) 06/07/2020   Palpitations 03/16/2020   Educated about COVID-19 virus infection 03/16/2020   Type 2 diabetes mellitus with complication, without long-term current use of insulin ([place]) 03/16/2020   Brain aneurysm 04/24/2016   Hypersomnia with sleep apnea, unspecified 09/08/2013    Ladean Raya, PTA 03/21/21 12:05 PM   [facility] [place], [place], [address] Phone: [phone]   Fax:  [phone]  Name: Nicholas Wheeler MRN: [id number] Date of Birth: [birth year]

## 2021-03-24 ENCOUNTER — Ambulatory Visit: Payer: No Typology Code available for payment source | Admitting: Physical Therapy

## 2021-03-24 ENCOUNTER — Other Ambulatory Visit: Payer: Self-pay

## 2021-03-24 DIAGNOSIS — R262 Difficulty in walking, not elsewhere classified: Secondary | ICD-10-CM

## 2021-03-24 DIAGNOSIS — R203 Hyperesthesia: Secondary | ICD-10-CM

## 2021-03-24 NOTE — Therapy (Signed)
[facility] [place], [place], [address] Phone: [phone]   Fax:  [phone]  Physical Therapy Treatment  Patient Details  Name: Nicholas Wheeler MRN: [id number] Date of Birth: 12-Oct-1960 Referring Provider (PT): Sherlyn Lees   Encounter Date: 03/24/2021   PT End of Session - 03/24/21 1312     Visit Number 3    Number of Visits 6    Date for PT Re-Evaluation 04/13/21    PT Start Time 0106    PT Stop Time 0158    PT Time Calculation (min) 52 min    Activity Tolerance Patient tolerated treatment well    Behavior During Therapy [facility] for tasks assessed/performed             Past Medical History:  Diagnosis Date   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   Diabetes ([place])    Type II   Diabetic neuropathy (HCC)    GERD (gastroesophageal reflux disease)    History of hiatal hernia    Hypersomnia with sleep apnea, unspecified 09/08/2013   Joint disorder    degenerated   Migraine    Obesity (BMI 35.0-39.9 without comorbidity)    Recurrent sinus infections    Skin cancer    Sleep apnea    saw Dr Brett Fairy in 09/2013; wears CPAP set at 14   TMJ (temporomandibular joint disorder)     Past Surgical History:  Procedure Laterality Date   ARTHOSCOPIC ROTAOR CUFF REPAIR Right 01/07/2018   Procedure: RIGHT SHOULDER ARTHROSCOPY, SUBACROMIAL DECOMPRESSION, OPEN ROTATOR CUFF REPAIR, OPEN DISTAL CLAVICLE EXCISION, BICEPS TENODESIS, SLAP DEBRIDMENT;  Surgeon: Netta Cedars, MD;  Location: [place];  Service: Orthopedics;  Laterality: Right;   CARPAL TUNNEL RELEASE Right    COLONOSCOPY W/ BIOPSIES AND POLYPECTOMY     IR ANGIO EXTRACRAN SEL COM CAROTID INNOMINATE UNI R MOD SED  05/02/2017   IR GENERIC HISTORICAL  04/24/2016   IR TRANSCATH/EMBOLIZ 04/24/2016 Luanne Bras, MD  [facility]   IR GENERIC HISTORICAL  04/24/2016   IR ANGIOGRAM FOLLOW UP STUDY 04/24/2016 Luanne Bras, MD [facility]   IR GENERIC HISTORICAL  04/24/2016   IR NEURO EACH ADD'L AFTER BASIC UNI RIGHT (MS) 04/24/2016 Luanne Bras, MD [facility]   IR GENERIC HISTORICAL  04/24/2016   IR ANGIO INTRA EXTRACRAN SEL INTERNAL CAROTID UNI R MOD SED 04/24/2016 Luanne Bras, MD [facility]   IR GENERIC HISTORICAL  05/19/2016   IR RADIOLOGIST EVAL & MGMT 05/19/2016 [facility]   IR GENERIC HISTORICAL  08/24/2016   IR ANGIO VERTEBRAL SEL SUBCLAVIAN INNOMINATE UNI R MOD SED 08/24/2016 Luanne Bras, MD [facility]   IR GENERIC HISTORICAL  08/24/2016   IR ANGIO INTRA EXTRACRAN SEL COM CAROTID INNOMINATE BILAT MOD SED 08/24/2016 Luanne Bras, MD [facility]   IR RADIOLOGIST EVAL & MGMT  11/29/2017   KNEE ARTHROSCOPY Right    LUMBAR DISC SURGERY     MOHS SURGERY     basal cell face   RADIOLOGY WITH ANESTHESIA N/A 04/24/2016   Procedure: EMBOLIZATION;  Surgeon: Luanne Bras, MD;  Location: [place];  Service: Radiology;  Laterality: N/A;   RADIOLOGY WITH ANESTHESIA N/A 05/02/2017   Procedure: RADIOLOGY WITH ANESTHESIA     EMBOLIZATION;  Surgeon: Luanne Bras, MD;  Location:  [place] [facility];  Service: Radiology;  Laterality: N/A;   TONSILLECTOMY      There were no vitals filed for this visit.   Subjective Assessment - 03/24/21 1307     Subjective COVID-19 screening perfromed upon arrival. Patient reported doing well after last treatment yet ongoing symptoms in feet    Pertinent History Aneurysm Right MCA.  Arthritis    AV block 2010 Diabetes ([place])      Type II   Diabetic neuropathy (HCC)     GERD (gastroesophageal reflux disease)     History of hiatal hernia     Hypersomnia with sleep apnea, unspecified 09/08/2013   Joint disorder degenerated   Migraine     Obesity (BMI 35.0-39.9 without comorbidity)     Recurrent sinus infections     Skin cancer     Sleep apnea   saw Dr Brett Fairy in  09/2013; wears CPAP set at 14   TMJ (temporomandibular joint disorder)    Limitations Walking;Standing    Patient Stated Goals To assess how PT can help neuropathic pain    Currently in Pain? Yes    Pain Score 9     Pain Location Foot    Pain Orientation Right;Left    Pain Descriptors / Indicators Shooting;Burning    Pain Type Neuropathic pain    Pain Onset More than a month ago    Pain Frequency Constant    Aggravating Factors  transition movements    Pain Relieving Factors nothing                               [facility] Adult PT Treatment/Exercise - 03/24/21 0001       Knee/Hip Exercises: Aerobic   Nustep L3 x25min LE only      Knee/Hip Exercises: Supine   Other Supine Knee/Hip Exercises bil HS nerve glides      Knee/Hip Exercises: Prone   Other Prone Exercises prone femoral nerve glide x1 each LE yet limitations due to back pain      Modalities   Modalities Electrical Stimulation;Ultrasound      Acupuncturist Stimulation Location bil ant tib/foot    Electrical Stimulation Action premod    Electrical Stimulation Parameters 8-150hz  x48min    Electrical Stimulation Goals Tone;Pain      Ultrasound   Ultrasound Location bil distal tib ant    Ultrasound Parameters 1.5w/c2/100%/21mhz x39min each LE (75min total)    Ultrasound Goals Other (Comment)   per PT POC     Ankle Exercises: Seated   Other Seated Ankle Exercises prostretch x53min each LE      Ankle Exercises: Standing   Rocker Board 3 minutes   calf stretch                        PT Long Term Goals - 03/21/21 1157       PT LONG TERM GOAL #1   Title Patient to be indep with HEP    Baseline currently doing toe Yoga per PT 03/21/21    Time 3    Period Weeks    Status On-going      PT LONG TERM GOAL #2   Title Patient will be able to report decreased irritability of symptoms to allow rest without symptoms    Time 3    Period Weeks    Status On-going       PT LONG TERM  GOAL #3   Title Patient will be able to ambulate greater than 20 mins without increased pain in bil feet    Time 3    Period Weeks    Status On-going                   Plan - 03/24/21 1313     Clinical Impression Statement Patient tolerated treatment well today. Patient continues to have symptoms in bil feet and nothing has helped with relief today cont with POC per PT. Patient responded well overall and feels nustep helps with circulation. Goals ongoing this week. Good response to modalities.    Personal Factors and Comorbidities Comorbidity 1;Comorbidity 2;Time since onset of injury/illness/exacerbation;Past/Current Experience;Other    Comorbidities DM II, obesity    Examination-Activity Limitations Sleep;Locomotion Level;Stand;Stairs    Examination-Participation Restrictions Driving;Yard Work;Community Activity    Stability/Clinical Decision Making Stable/Uncomplicated    Rehab Potential Fair    PT Frequency 2x / week    PT Duration 3 weeks    PT Treatment/Interventions Cryotherapy;Gait training;Ultrasound;Balance training;Therapeutic exercise;Therapeutic activities;Functional mobility training;Manual techniques;Vasopneumatic Device;Electrical Stimulation    PT Next Visit Plan Incorporate Estim to plan of care; distal nerve glides, ultrasound to anterior tibialis and forsum of foot, intrinsic foot mobility and strength    Consulted and Agree with Plan of Care Patient             Patient will benefit from skilled therapeutic intervention in order to improve the following deficits and impairments:  Abnormal gait, Hypomobility, Impaired sensation, Obesity, Pain  Visit Diagnosis: Difficulty in walking, not elsewhere classified  Hyperesthesia     Problem List Patient Active Problem List   Diagnosis Date Noted   Sleep apnea 06/07/2020   SOB (shortness of breath) 06/07/2020   Palpitations 03/16/2020   Educated about COVID-19 virus infection 03/16/2020    Type 2 diabetes mellitus with complication, without long-term current use of insulin ([place]) 03/16/2020   Brain aneurysm 04/24/2016   Hypersomnia with sleep apnea, unspecified 09/08/2013    Calley Drenning P, PTA 03/24/2021, 2:03 [facility] [place], [place], [address] Phone: [phone]   Fax:  [phone]  Name: Nicholas Wheeler MRN: [id number] Date of Birth: Feb 13, 1961

## 2021-03-28 ENCOUNTER — Other Ambulatory Visit: Payer: Self-pay

## 2021-03-28 ENCOUNTER — Ambulatory Visit: Payer: No Typology Code available for payment source

## 2021-03-28 DIAGNOSIS — R262 Difficulty in walking, not elsewhere classified: Secondary | ICD-10-CM

## 2021-03-28 DIAGNOSIS — R203 Hyperesthesia: Secondary | ICD-10-CM

## 2021-03-28 NOTE — Therapy (Signed)
[facility] [place], [place], [address] Phone: [phone]   Fax:  [phone]  Physical Therapy Treatment  Patient Details  Name: Nicholas Wheeler MRN: [id number] Date of Birth: [birth year] Referring Provider (PT): Sherlyn Lees   Encounter Date: 03/28/2021   PT End of Session - 03/28/21 1522     Visit Number 4    Number of Visits 6    Date for PT Re-Evaluation 04/13/21    PT Start Time 1030    PT Stop Time 1130    PT Time Calculation (min) 60 min    Activity Tolerance Patient tolerated treatment well    Behavior During Therapy [facility] for tasks assessed/performed             Past Medical History:  Diagnosis Date   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   Diabetes ([place])    Type II   Diabetic neuropathy (HCC)    GERD (gastroesophageal reflux disease)    History of hiatal hernia    Hypersomnia with sleep apnea, unspecified 09/08/2013   Joint disorder    degenerated   Migraine    Obesity (BMI 35.0-39.9 without comorbidity)    Recurrent sinus infections    Skin cancer    Sleep apnea    saw Dr Brett Fairy in 09/2013; wears CPAP set at 14   TMJ (temporomandibular joint disorder)     Past Surgical History:  Procedure Laterality Date   ARTHOSCOPIC ROTAOR CUFF REPAIR Right 01/07/2018   Procedure: RIGHT SHOULDER ARTHROSCOPY, SUBACROMIAL DECOMPRESSION, OPEN ROTATOR CUFF REPAIR, OPEN DISTAL CLAVICLE EXCISION, BICEPS TENODESIS, SLAP DEBRIDMENT;  Surgeon: Netta Cedars, MD;  Location: [place];  Service: Orthopedics;  Laterality: Right;   CARPAL TUNNEL RELEASE Right    COLONOSCOPY W/ BIOPSIES AND POLYPECTOMY     IR ANGIO EXTRACRAN SEL COM CAROTID INNOMINATE UNI R MOD SED  05/02/2017   IR GENERIC HISTORICAL  04/24/2016   IR TRANSCATH/EMBOLIZ 04/24/2016 Luanne Bras, MD  [facility]   IR GENERIC HISTORICAL  04/24/2016   IR ANGIOGRAM FOLLOW UP STUDY 04/24/2016 Luanne Bras, MD [facility]   IR GENERIC HISTORICAL  04/24/2016   IR NEURO EACH ADD'L AFTER BASIC UNI RIGHT (MS) 04/24/2016 Luanne Bras, MD [facility]   IR GENERIC HISTORICAL  04/24/2016   IR ANGIO INTRA EXTRACRAN SEL INTERNAL CAROTID UNI R MOD SED 04/24/2016 Luanne Bras, MD [facility]   IR GENERIC HISTORICAL  05/19/2016   IR RADIOLOGIST EVAL & MGMT 05/19/2016 [facility]   IR GENERIC HISTORICAL  08/24/2016   IR ANGIO VERTEBRAL SEL SUBCLAVIAN INNOMINATE UNI R MOD SED 08/24/2016 Luanne Bras, MD [facility]   IR GENERIC HISTORICAL  08/24/2016   IR ANGIO INTRA EXTRACRAN SEL COM CAROTID INNOMINATE BILAT MOD SED 08/24/2016 Luanne Bras, MD [facility]   IR RADIOLOGIST EVAL & MGMT  11/29/2017   KNEE ARTHROSCOPY Right    LUMBAR DISC SURGERY     MOHS SURGERY     basal cell face   RADIOLOGY WITH ANESTHESIA N/A 04/24/2016   Procedure: EMBOLIZATION;  Surgeon: Luanne Bras, MD;  Location: [place];  Service: Radiology;  Laterality: N/A;   RADIOLOGY WITH ANESTHESIA N/A 05/02/2017   Procedure: RADIOLOGY WITH ANESTHESIA     EMBOLIZATION;  Surgeon: Luanne Bras, MD;  Location:  [place] [facility];  Service: Radiology;  Laterality: N/A;   TONSILLECTOMY      There were no vitals filed for this visit.   Subjective Assessment - 03/28/21 1515     Subjective COVID-19 screening perfromed upon arrival. Patient reported feeling that his nerves are a little more achy in the front of his feet - he states that he was doing several projects around the house and thinks this may have caused the worsening. He has been doing as much as he can but limited due to back pain with changing position.    Pertinent History Aneurysm Right MCA.  Arthritis    AV block 2010 Diabetes ([place])      Type II   Diabetic neuropathy (HCC)     GERD (gastroesophageal reflux disease)     History of hiatal hernia     Hypersomnia  with sleep apnea, unspecified 09/08/2013   Joint disorder degenerated   Migraine     Obesity (BMI 35.0-39.9 without comorbidity)     Recurrent sinus infections     Skin cancer     Sleep apnea   saw Dr Brett Fairy in 09/2013; wears CPAP set at 14   TMJ (temporomandibular joint disorder)    Limitations Walking;Standing;Sitting;Other (comment)   transfers   Currently in Pain? Yes    Pain Score 8     Pain Onset More than a month ago                               [facility] Adult PT Treatment/Exercise - 03/28/21 0001       Exercises   Exercises Knee/Hip;Ankle      Knee/Hip Exercises: Stretches   Active Hamstring Stretch Both;2 reps;10 seconds    Active Hamstring Stretch Limitations standing    Gastroc Stretch 3 reps;20 seconds    Gastroc Stretch Limitations on rocker board      Knee/Hip Exercises: Aerobic   Stationary Bike L3 x 10 mins      Knee/Hip Exercises: Standing   Heel Raises 2 sets;10 reps      Knee/Hip Exercises: Supine   Other Supine Knee/Hip Exercises sciatic nerve glide - bil ; 2 x 10    Other Supine Knee/Hip Exercises standing femoral/saphenous nerve glide (HEP)  2xo10 eac      Knee/Hip Exercises: Prone   Other Prone Exercises held prone version; tolerates prone at end of session this visit      Modalities   Modalities Electrical Stimulation;Ultrasound      Electrical Stimulation   Electrical Stimulation Location Lumbar paraspinals    Electrical Stimulation Action IFC    Electrical Stimulation Parameters 80-'150hz'$ ; 11.5 -12.5 V    Electrical Stimulation Goals Tone;Pain      Manual Therapy   Manual Therapy Soft tissue mobilization;Neural Stretch    Manual therapy comments patient reports feeling comfortable    Neural Stretch femorla nerve glide/ sciatic nerve glide                         PT Long Term Goals - 03/21/21 1157       PT LONG TERM GOAL #1   Title Patient to be indep with HEP    Baseline currently doing toe Yoga per PT  03/21/21    Time 3    Period Weeks    Status On-going      PT LONG TERM GOAL #2   Title Patient will be  able to report decreased irritability of symptoms to allow rest without symptoms    Time 3    Period Weeks    Status On-going      PT LONG TERM GOAL #3   Title Patient will be able to ambulate greater than 20 mins without increased pain in bil feet    Time 3    Period Weeks    Status On-going                   Plan - 03/28/21 1522     Clinical Impression Statement Patient with normal response to intervention this visit. He was able to express re-creation of symptoms with standing extension and repeated saphenous nerve glides to provide a "cooling effect" to the anterior tibai and dorsum of the feet. Patient reports an increase in symptoms at time of standing between activities. His symptoms present moderate to severily irritable with minimal long term changes present at this time. Plan to continur with mobilization of the neural ocmponents with goals of reducing peripheral impairnents.    Personal Factors and Comorbidities Comorbidity 1;Comorbidity 2;Time since onset of injury/illness/exacerbation;Past/Current Experience;Other    Comorbidities DM II, obesity    Examination-Activity Limitations Sleep;Locomotion Level;Stand;Stairs    Examination-Participation Restrictions Driving;Yard Work;Community Activity    Stability/Clinical Decision Making Stable/Uncomplicated    Clinical Decision Making Low    Rehab Potential Fair    PT Frequency 2x / week    PT Duration 3 weeks    PT Treatment/Interventions Cryotherapy;Gait training;Ultrasound;Balance training;Therapeutic exercise;Therapeutic activities;Functional mobility training;Manual techniques;Vasopneumatic Device;Electrical Stimulation    PT Next Visit Plan distal nerve glide, intrinsic foot mobility and strength             Patient will benefit from skilled therapeutic intervention in order to improve the following  deficits and impairments:  Abnormal gait, Hypomobility, Impaired sensation, Obesity, Pain  Visit Diagnosis: Difficulty in walking, not elsewhere classified  Hyperesthesia     Problem List Patient Active Problem List   Diagnosis Date Noted   Sleep apnea 06/07/2020   SOB (shortness of breath) 06/07/2020   Palpitations 03/16/2020   Educated about COVID-19 virus infection 03/16/2020   Type 2 diabetes mellitus with complication, without long-term current use of insulin ([place]) 03/16/2020   Brain aneurysm 04/24/2016   Hypersomnia with sleep apnea, unspecified 09/08/2013    Marylou Mccoy PT, DPT 03/28/2021, 3:26 PM  [facility] [place], [place], [address] Phone: [phone]   Fax:  [phone]  Name: Nicholas Wheeler MRN: [id number] Date of Birth: [birth year]

## 2021-03-31 ENCOUNTER — Encounter: Payer: No Typology Code available for payment source | Admitting: Physical Therapy

## 2021-04-04 ENCOUNTER — Ambulatory Visit: Payer: No Typology Code available for payment source | Attending: Pain Medicine

## 2021-04-04 ENCOUNTER — Other Ambulatory Visit: Payer: Self-pay

## 2021-04-04 DIAGNOSIS — R203 Hyperesthesia: Secondary | ICD-10-CM | POA: Diagnosis present

## 2021-04-04 DIAGNOSIS — R262 Difficulty in walking, not elsewhere classified: Secondary | ICD-10-CM | POA: Diagnosis not present

## 2021-04-04 NOTE — Therapy (Signed)
[facility] [place], [place], [address] Phone: [phone]   Fax:  [phone]  Physical Therapy Treatment  Patient Details  Name: Nicholas Wheeler MRN: [id number] Date of Birth: 12-15-1960 Referring Provider (PT): Sherlyn Lees   Encounter Date: 04/04/2021   PT End of Session - 04/04/21 1026     Visit Number 5    Number of Visits 6    Date for PT Re-Evaluation 04/13/21    PT Start Time 0900    PT Stop Time 0948    PT Time Calculation (min) 48 min    Equipment Utilized During Treatment --   Heat pack to lumbar spine   Activity Tolerance Patient tolerated treatment well    Behavior During Therapy [facility] for tasks assessed/performed             Past Medical History:  Diagnosis Date   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   Diabetes ([place])    Type II   Diabetic neuropathy (HCC)    GERD (gastroesophageal reflux disease)    History of hiatal hernia    Hypersomnia with sleep apnea, unspecified 09/08/2013   Joint disorder    degenerated   Migraine    Obesity (BMI 35.0-39.9 without comorbidity)    Recurrent sinus infections    Skin cancer    Sleep apnea    saw Dr Brett Fairy in 09/2013; wears CPAP set at 14   TMJ (temporomandibular joint disorder)     Past Surgical History:  Procedure Laterality Date   ARTHOSCOPIC ROTAOR CUFF REPAIR Right 01/07/2018   Procedure: RIGHT SHOULDER ARTHROSCOPY, SUBACROMIAL DECOMPRESSION, OPEN ROTATOR CUFF REPAIR, OPEN DISTAL CLAVICLE EXCISION, BICEPS TENODESIS, SLAP DEBRIDMENT;  Surgeon: Netta Cedars, MD;  Location: [facility];  Service: Orthopedics;  Laterality: Right;   CARPAL TUNNEL RELEASE Right    COLONOSCOPY W/ BIOPSIES AND POLYPECTOMY     IR ANGIO EXTRACRAN SEL COM CAROTID INNOMINATE UNI R MOD SED  05/02/2017   IR GENERIC  HISTORICAL  04/24/2016   IR TRANSCATH/EMBOLIZ 04/24/2016 Luanne Bras, MD [facility]   IR GENERIC HISTORICAL  04/24/2016   IR ANGIOGRAM FOLLOW UP STUDY 04/24/2016 Luanne Bras, MD [facility]   IR GENERIC HISTORICAL  04/24/2016   IR NEURO EACH ADD'L AFTER BASIC UNI RIGHT (MS) 04/24/2016 Luanne Bras, MD [facility]   IR GENERIC HISTORICAL  04/24/2016   IR ANGIO INTRA EXTRACRAN SEL INTERNAL CAROTID UNI R MOD SED 04/24/2016 Luanne Bras, MD [facility]   IR GENERIC HISTORICAL  05/19/2016   IR RADIOLOGIST EVAL & MGMT 05/19/2016 [facility]   IR GENERIC HISTORICAL  08/24/2016   IR ANGIO VERTEBRAL SEL SUBCLAVIAN INNOMINATE UNI R MOD SED 08/24/2016 Luanne Bras, MD [facility]   IR GENERIC HISTORICAL  08/24/2016   IR ANGIO INTRA EXTRACRAN SEL COM CAROTID INNOMINATE BILAT MOD SED 08/24/2016 Luanne Bras, MD [facility]   IR RADIOLOGIST EVAL & MGMT  11/29/2017   KNEE ARTHROSCOPY Right    LUMBAR DISC SURGERY     MOHS SURGERY     basal cell face   RADIOLOGY WITH ANESTHESIA N/A 04/24/2016   Procedure: EMBOLIZATION;  Surgeon: Luanne Bras, MD;  Location: [facility];  Service: Radiology;  Laterality: N/A;   RADIOLOGY WITH ANESTHESIA N/A 05/02/2017   Procedure: RADIOLOGY  WITH ANESTHESIA     EMBOLIZATION;  Surgeon: Luanne Bras, MD;  Location: [facility];  Service: Radiology;  Laterality: N/A;   TONSILLECTOMY      There were no vitals filed for this visit.   Subjective Assessment - 04/04/21 0903     Subjective COVID-19 screening perfromed upon arrival. Patient reports that he walked around [place] yesterday for a few hours without difficulty. He states that he has more pain when he stops walking but otherwise it stays the same. He reports a 7-8/10 pain in the back and both of the tops of his feet. He hopes to have the trial of the implant device placed on him Thursday.    Pertinent History Aneurysm Right MCA.  Arthritis    AV block 2010 Diabetes ([place])      Type II    Diabetic neuropathy (HCC)     GERD (gastroesophageal reflux disease)     History of hiatal hernia     Hypersomnia with sleep apnea, unspecified 09/08/2013   Joint disorder degenerated   Migraine     Obesity (BMI 35.0-39.9 without comorbidity)     Recurrent sinus infections     Skin cancer     Sleep apnea   saw Dr Brett Fairy in 09/2013; wears CPAP set at 14   TMJ (temporomandibular joint disorder)    Limitations Walking;Standing;Sitting;Other (comment)    How long can you sit comfortably? "it depends on the chair"    How long can you stand comfortably? "about the same" (a couple of hours)    How long can you walk comfortably? "a couple hours"    Patient Stated Goals To assess how PT can help neuropathic pain    Currently in Pain? Yes    Pain Score 8     Pain Location Back    Pain Orientation Medial    Pain Descriptors / Indicators Aching;Discomfort    Pain Type Chronic pain    Pain Radiating Towards distal LE to top of the feet    Pain Onset More than a month ago    Multiple Pain Sites Yes    Pain Score 8    Pain Location Foot    Pain Orientation Left;Right    Pain Descriptors / Indicators Burning    Pain Type Neuropathic pain    Pain Radiating Towards dorsum of the feet    Pain Onset More than a month ago    Pain Frequency Constant                               [facility] Adult PT Treatment/Exercise - 04/04/21 0001       Neuro Re-ed    Neuro Re-ed Details  seated sciatic nerve glide, (58mns) prone femoral nerve glide (2 mins)      Exercises   Exercises Knee/Hip;Lumbar      Lumbar Exercises: Stretches   Passive Hamstring Stretch 10 seconds;3 reps    Passive Hamstring Stretch Limitations with seated nerve glide (sciatic)      Lumbar Exercises: Supine   Ab Set 20 reps;15 reps    AB Set Limitations seated press down ; then one set with marching      Knee/Hip Exercises: Aerobic   Tread Mill 3 mins (elevated to 8% incline) on 2.0 mph (no change in status)    Nustep L3  x 5 mins with UE use      Knee/Hip Exercises: Seated   Sit to  Sand 20 reps;without UE support;Other (comment)   with 3# gel ball between knees     Manual Therapy   Manual Therapy Soft tissue mobilization;Joint mobilization    Manual therapy comments palpable inflammation at L5/S1 - reduced with manual therapy; patient reports "this helped, I could have this all day"    Joint Mobilization CPA gr II- III at L3-S1 for gentle pain modulation and mobility    Soft tissue mobilization STM with therapist ulnar border (at multifidi and paraspinals)                         PT Long Term Goals - 03/21/21 1157       PT LONG TERM GOAL #1   Title Patient to be indep with HEP    Baseline currently doing toe Yoga per PT 03/21/21    Time 3    Period Weeks    Status On-going      PT LONG TERM GOAL #2   Title Patient will be able to report decreased irritability of symptoms to allow rest without symptoms    Time 3    Period Weeks    Status On-going      PT LONG TERM GOAL #3   Title Patient will be able to ambulate greater than 20 mins without increased pain in bil feet    Time 3    Period Weeks    Status On-going                   Plan - 04/04/21 1026     Clinical Impression Statement Patient with normal repsone to manual therapy and heat this visit - no adverse symptoms or signs reported or observed. Patient progressed with core and posterior chain exercises to offload the lumbar spine. Patient with improving core control however requires VCs to refrain from placing exertion on lumbar spine and through shpulders with today's seated pressdowns. Plan to progress him to standing pallof press as tolerated and resistance walking. Patient anticipates surgical interventin for symptoms management rather than PT care. Plan to assess progress towards goals, MD recommendations as of 04/07/21 (Thursday); and plan to discharge with HEP independently.    Personal Factors and Comorbidities  Comorbidity 1;Comorbidity 2;Time since onset of injury/illness/exacerbation;Past/Current Experience;Other    Comorbidities DM II, obesity    Examination-Activity Limitations Sleep;Locomotion Level;Stand;Stairs    Examination-Participation Restrictions Driving;Yard Work;Community Activity    Stability/Clinical Decision Making Stable/Uncomplicated    Clinical Decision Making Low    Rehab Potential Fair    PT Frequency 2x / week    PT Duration 3 weeks    PT Treatment/Interventions Cryotherapy;Gait training;Ultrasound;Balance training;Therapeutic exercise;Therapeutic activities;Functional mobility training;Manual techniques;Vasopneumatic Device;Electrical Stimulation    PT Next Visit Plan Plan to DC with HEP  (femoral nerve glide, sciatic nerve glide, variations of sit to stands) Pallof press as  tolerated    Consulted and Agree with Plan of Care Patient             Patient will benefit from skilled therapeutic intervention in order to improve the following deficits and impairments:  Abnormal gait, Hypomobility, Impaired sensation, Obesity, Pain  Visit Diagnosis: Difficulty in walking, not elsewhere classified  Hyperesthesia     Problem List Patient Active Problem List   Diagnosis Date Noted   Sleep apnea 06/07/2020   SOB (shortness of breath) 06/07/2020   Palpitations 03/16/2020   Educated about COVID-19 virus infection 03/16/2020   Type 2 diabetes mellitus with complication, without  long-term current use of insulin ([place]) 03/16/2020   Brain aneurysm 04/24/2016   Hypersomnia with sleep apnea, unspecified 09/08/2013    Marylou Mccoy PT, DPT  04/04/2021, 11:34 AM  [facility] : [facility] [place], [place], [address] Phone: [phone]   Fax:  [phone]  Name: Nicholas Wheeler MRN: [id number] Date of Birth: 09/04/1961

## 2021-04-11 ENCOUNTER — Other Ambulatory Visit: Payer: Self-pay

## 2021-04-11 ENCOUNTER — Ambulatory Visit: Payer: No Typology Code available for payment source | Admitting: Physical Therapy

## 2021-04-11 DIAGNOSIS — R262 Difficulty in walking, not elsewhere classified: Secondary | ICD-10-CM

## 2021-04-11 DIAGNOSIS — R203 Hyperesthesia: Secondary | ICD-10-CM

## 2021-04-11 NOTE — Therapy (Signed)
[facility] [place], [place], [address] Phone: [phone]   Fax:  [phone]  Physical Therapy Treatment  Patient Details  Name: Nicholas Wheeler MRN: [id number] Date of Birth: 03-31-1961 Referring Provider (PT): Sherlyn Lees   Encounter Date: 04/11/2021   PT End of Session - 04/11/21 1119     Visit Number 6    Number of Visits 6    Date for PT Re-Evaluation 04/13/21    PT Start Time 1115    PT Stop Time 1156    PT Time Calculation (min) 41 min    Activity Tolerance Patient tolerated treatment well    Behavior During Therapy [facility] for tasks assessed/performed             Past Medical History:  Diagnosis Date   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   Diabetes ([place])    Type II   Diabetic neuropathy (HCC)    GERD (gastroesophageal reflux disease)    History of hiatal hernia    Hypersomnia with sleep apnea, unspecified 09/08/2013   Joint disorder    degenerated   Migraine    Obesity (BMI 35.0-39.9 without comorbidity)    Recurrent sinus infections    Skin cancer    Sleep apnea    saw Dr Brett Fairy in 09/2013; wears CPAP set at 14   TMJ (temporomandibular joint disorder)     Past Surgical History:  Procedure Laterality Date   ARTHOSCOPIC ROTAOR CUFF REPAIR Right 01/07/2018   Procedure: RIGHT SHOULDER ARTHROSCOPY, SUBACROMIAL DECOMPRESSION, OPEN ROTATOR CUFF REPAIR, OPEN DISTAL CLAVICLE EXCISION, BICEPS TENODESIS, SLAP DEBRIDMENT;  Surgeon: Netta Cedars, MD;  Location: [facility];  Service: Orthopedics;  Laterality: Right;   CARPAL TUNNEL RELEASE Right    COLONOSCOPY W/ BIOPSIES AND POLYPECTOMY     IR ANGIO EXTRACRAN SEL COM CAROTID INNOMINATE UNI R MOD SED  05/02/2017   IR GENERIC HISTORICAL  04/24/2016   IR TRANSCATH/EMBOLIZ 04/24/2016 Luanne Bras, MD  [facility]   IR GENERIC HISTORICAL  04/24/2016   IR ANGIOGRAM FOLLOW UP STUDY 04/24/2016 Luanne Bras, MD [facility]   IR GENERIC HISTORICAL  04/24/2016   IR NEURO EACH ADD'L AFTER BASIC UNI RIGHT (MS) 04/24/2016 Luanne Bras, MD [facility]   IR GENERIC HISTORICAL  04/24/2016   IR ANGIO INTRA EXTRACRAN SEL INTERNAL CAROTID UNI R MOD SED 04/24/2016 Luanne Bras, MD [facility]   IR GENERIC HISTORICAL  05/19/2016   IR RADIOLOGIST EVAL & MGMT 05/19/2016 [facility]   IR GENERIC HISTORICAL  08/24/2016   IR ANGIO VERTEBRAL SEL SUBCLAVIAN INNOMINATE UNI R MOD SED 08/24/2016 Luanne Bras, MD [facility]   IR GENERIC HISTORICAL  08/24/2016   IR ANGIO INTRA EXTRACRAN SEL COM CAROTID INNOMINATE BILAT MOD SED 08/24/2016 Luanne Bras, MD [facility]   IR RADIOLOGIST EVAL & MGMT  11/29/2017   KNEE ARTHROSCOPY Right    LUMBAR DISC SURGERY     MOHS SURGERY     basal cell face   RADIOLOGY WITH ANESTHESIA N/A 04/24/2016   Procedure: EMBOLIZATION;  Surgeon: Luanne Bras, MD;  Location: [facility];  Service: Radiology;  Laterality: N/A;   RADIOLOGY WITH ANESTHESIA N/A 05/02/2017   Procedure: RADIOLOGY WITH ANESTHESIA     EMBOLIZATION;  Surgeon: Luanne Bras, MD;  Location:  [facility];  Service: Radiology;  Laterality: N/A;   TONSILLECTOMY      There were no vitals filed for this visit.   Subjective Assessment - 04/11/21 1116     Subjective COVID-19 screening perfromed upon arrival. Patient arrived with ongoing pain. Is to have implant device put in soon.    Pertinent History Aneurysm Right MCA.  Arthritis    AV block 2010 Diabetes ([place])      Type II   Diabetic neuropathy (HCC)     GERD (gastroesophageal reflux disease)     History of hiatal hernia     Hypersomnia with sleep apnea, unspecified 09/08/2013   Joint disorder degenerated   Migraine     Obesity (BMI 35.0-39.9 without comorbidity)     Recurrent sinus infections     Skin cancer     Sleep apnea   saw Dr Brett Fairy in  09/2013; wears CPAP set at 14   TMJ (temporomandibular joint disorder)    Limitations Walking;Standing;Sitting;Other (comment)    How long can you sit comfortably? "it depends on the chair"    How long can you stand comfortably? "about the same" (a couple of hours)    How long can you walk comfortably? "a couple hours"    Patient Stated Goals To assess how PT can help neuropathic pain    Currently in Pain? Yes    Pain Score 8     Pain Location Back    Pain Orientation Lower    Pain Descriptors / Indicators Discomfort;Aching    Pain Radiating Towards LE to feet    Pain Onset More than a month ago    Pain Frequency Constant    Aggravating Factors  transition movements    Pain Relieving Factors nothing reported                               [facility] Adult PT Treatment/Exercise - 04/11/21 0001       Lumbar Exercises: Seated   Other Seated Lumbar Exercises seated core sets x30      Knee/Hip Exercises: Stretches   Active Hamstring Stretch Both;2 reps;10 seconds      Knee/Hip Exercises: Aerobic   Nustep L3 70mn UE/LE activity      Knee/Hip Exercises: Standing   Heel Raises 2 sets;10 reps    Heel Raises Limitations toe ups 2x10    Rocker Board 5 minutes      Knee/Hip Exercises: Seated   Sit to Sand 20 reps;without UE support      Knee/Hip Exercises: Supine   Other Supine Knee/Hip Exercises sciatic nerve glide - bil ; 2 x 10      Ankle Exercises: Seated   Other Seated Ankle Exercises prostretch x355m each LE                    PT Education - 04/11/21 1152     Education Details HEP    Person(s) Educated Patient    Methods Demonstration;Explanation;Handout    Comprehension Verbalized understanding;Returned demonstration                 PT Long Term Goals - 04/11/21 1125       PT LONG TERM GOAL #1   Title Patient to be indep with HEP    Baseline currently doing toe Yoga per PT 03/21/21    Time 3    Period Weeks    Status Achieved  PT LONG TERM GOAL #2   Title Patient will be able to report decreased irritability of symptoms to allow rest without symptoms    Baseline no improvement per reported 04/11/21    Time 3    Period Weeks    Status Not Met      PT LONG TERM GOAL #3   Title Patient will be able to ambulate greater than 20 mins without increased pain in bil feet    Baseline able to ambulate yet when stops pain is increased 04/11/21    Time 3    Period Weeks    Status Not Met                   Plan - 04/11/21 1158     Clinical Impression Statement Patient tolerated treatment well today. Issued HEP for home progression. Met LTG#1 with others not met. DC today.    Personal Factors and Comorbidities Comorbidity 1;Comorbidity 2;Time since onset of injury/illness/exacerbation;Past/Current Experience;Other    Comorbidities DM II, obesity    Examination-Activity Limitations Sleep;Locomotion Level;Stand;Stairs    Examination-Participation Restrictions Driving;Yard Work;Community Activity    Stability/Clinical Decision Making Stable/Uncomplicated    Rehab Potential Fair    PT Frequency 2x / week    PT Duration 3 weeks    PT Treatment/Interventions Cryotherapy;Gait training;Ultrasound;Balance training;Therapeutic exercise;Therapeutic activities;Functional mobility training;Manual techniques;Vasopneumatic Device;Electrical Stimulation    PT Next Visit Plan DC    Consulted and Agree with Plan of Care Patient             Patient will benefit from skilled therapeutic intervention in order to improve the following deficits and impairments:  Abnormal gait, Hypomobility, Impaired sensation, Obesity, Pain  Visit Diagnosis: Difficulty in walking, not elsewhere classified  Hyperesthesia     Problem List Patient Active Problem List   Diagnosis Date Noted   Sleep apnea 06/07/2020   SOB (shortness of breath) 06/07/2020   Palpitations 03/16/2020   Educated about COVID-19 virus infection 03/16/2020    Type 2 diabetes mellitus with complication, without long-term current use of insulin ([place]) 03/16/2020   Brain aneurysm 04/24/2016   Hypersomnia with sleep apnea, unspecified 09/08/2013    Ladean Raya, PTA 04/11/21 12:00 PM   [facility] [place], [place], [address] Phone: [phone]   Fax:  [phone]  Name: RUSHAWN CAPSHAW MRN: [id number] Date of Birth: 02/25/1961  PHYSICAL THERAPY DISCHARGE SUMMARY  Visits from Start of Care: 6.  Current functional level related to goals / functional outcomes: See above.    Remaining deficits: See goal section.   Education / Equipment: HEP.   Patient agrees to discharge. Patient goals were partially met. Patient is being discharged due to lack of progress.    Mali Applegate MPT

## 2021-04-11 NOTE — Patient Instructions (Signed)
  Lower Body: Toe Rise   Standing, place feet apart. Hold arms out for balance or use support. Rise up on toes. Hold _3___ seconds, then lower. Repeat immediately. Repeat __10__ times. Do __2__ sessions per day.   Half Squat to Chair   Stand with feet shoulder width apart. Push buttocks backward and lower slowly, sitting in chair lightly and returning to standing position. Complete _2_ sets of 10_ repetitions. Perform __2-3_ sessions per day.    HAMSTRING STRETCH WITH TOWEL  While lying down on your back, hook a towel or strap under  your foot and draw up your leg until a stretch is felt along the backside of your leg.   Keep your knee in a straightened position during the stretch.

## 2021-06-09 ENCOUNTER — Ambulatory Visit (INDEPENDENT_AMBULATORY_CARE_PROVIDER_SITE_OTHER): Payer: No Typology Code available for payment source | Admitting: Endocrinology

## 2021-06-09 ENCOUNTER — Other Ambulatory Visit: Payer: Self-pay

## 2021-06-09 ENCOUNTER — Encounter: Payer: Self-pay | Admitting: Endocrinology

## 2021-06-09 VITALS — BP 130/78 | HR 69 | Ht 67.5 in | Wt 239.6 lb

## 2021-06-09 DIAGNOSIS — E118 Type 2 diabetes mellitus with unspecified complications: Secondary | ICD-10-CM | POA: Diagnosis not present

## 2021-06-09 LAB — POCT GLYCOSYLATED HEMOGLOBIN (HGB A1C): Hemoglobin A1C: 9.1 % — AB (ref 4.0–5.6)

## 2021-06-09 MED ORDER — EMPAGLIFLOZIN 25 MG PO TABS: 25.0000 mg | ORAL_TABLET | Freq: Every day | ORAL | 3 refills | Status: DC

## 2021-06-09 MED ORDER — TRULICITY 0.75 MG/0.5ML ~~LOC~~ SOAJ: 0.7500 mg | SUBCUTANEOUS | 3 refills | Status: DC

## 2021-06-09 MED ORDER — METFORMIN HCL ER 500 MG PO TB24: 2000.0000 mg | ORAL_TABLET | Freq: Every day | ORAL | 3 refills | Status: DC

## 2021-06-09 NOTE — Patient Instructions (Addendum)
good diet and exercise significantly improve the control of your diabetes.  please let me know if you wish to be referred to a dietician.  high blood sugar is very risky to your health.  you should see an eye doctor and dentist every year.  It is very important to get all recommended vaccinations.  Controlling your blood pressure and cholesterol drastically reduces the damage diabetes does to your body.  Those who smoke should quit.  Please discuss these with your doctor.  check your blood sugar once a day.  vary the time of day when you check, between before the 3 meals, and at bedtime.  also check if you have symptoms of your blood sugar being too high or too low.  please keep a record of the readings and bring it to your next appointment here (or you can bring the meter itself).  You can write it on any piece of paper.  please call us sooner if your blood sugar goes below 70, or if most of your readings are over 200.     Bariatric Surgery You have so much to gain by losing weight.  You may have already tried every diet and exercise plan imaginable.  And, you may have sought advice from your family physician, too.   Sometimes, in spite of such diligent efforts, you may not be able to achieve long-term results by yourself.  In cases of severe obesity, bariatric or weight loss surgery is a proven method of achieving long-term weight control. I have sent 2 prescriptions to your pharmacy: to add Trulicity, and to change the metformin to extended-release. Please continue the same Jardiance. Please call or message [place] in 2 weeks, to tell us how the blood sugar is doing, and if you have any nausea/heartburn Please come back for a follow-up appointment in 2 months.   Our Services Our bariatric surgery programs offer our patients new hope and long-term weight-loss solution.  Since introducing our services in 2003, we have conducted more than 2,400 successful procedures.  Our program is designated as a  Programmer, multimedia by the Metabolic and Bariatric Surgery Accreditation and Quality Improvement Program (MBSAQIP), a IT trainer that sets rigorous patient safety and outcome standards.  Our program is also designated as a Ecologist by [employer].   Our exceptional weight-loss surgery team specializes in diagnosis, treatment, follow-up care, and ongoing support for our patients with severe weight loss challenges.  We currently offer laparoscopic sleeve gastrectomy, gastric bypass, and adjustable gastric band (LAP-BAND).    Attend our [place] Choosing to undergo a bariatric procedure is a big decision, and one that should not be taken lightly.  You now have two options in how you learn about weight-loss surgery - in person or online.  Our objective is to ensure you have all of the information that you need to evaluate the advantages and obligations of this life changing procedure.  Please note that you are not alone in this process, and our experienced team is ready to assist and answer all of your questions.  There are several ways to register for a seminar (either on-line or in person):  Call [phone] Go on-line to [facility] and register for either type of seminar.  [email or web address]

## 2021-06-09 NOTE — Progress Notes (Signed)
Subjective:    Patient ID: Nicholas Wheeler, male    DOB: 31-Jul-1961, 60 y.o.   MRN: [id number]  HPI pt is referred by Dr Moreen Fowler, for diabetes.  Pt states DM was dx'ed in 9767; it is complicated by PN; he has never been on insulin; pt says his diet and exercise are poor; he has never had pancreatitis, pancreatic surgery, severe hypoglycemia or DKA.  He takes 2 oral agents.  He could not afford Mounjaro.  Pt says he has no problem giving himself an injection.  He says cbg's are approx 200.   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   Diabetes ([place])    Type II   Diabetic neuropathy (HCC)    GERD (gastroesophageal reflux disease)    History of hiatal hernia    Hypersomnia with sleep apnea, unspecified 09/08/2013   Joint disorder    degenerated   Migraine    Obesity (BMI 35.0-39.9 without comorbidity)    Recurrent sinus infections    Skin cancer    Sleep apnea    saw Dr Brett Fairy in 09/2013; wears CPAP set at 14   TMJ (temporomandibular joint disorder)     Past Surgical History:  Procedure Laterality Date   ARTHOSCOPIC ROTAOR CUFF REPAIR Right 01/07/2018   Procedure: RIGHT SHOULDER ARTHROSCOPY, SUBACROMIAL DECOMPRESSION, OPEN ROTATOR CUFF REPAIR, OPEN DISTAL CLAVICLE EXCISION, BICEPS TENODESIS, SLAP DEBRIDMENT;  Surgeon: Netta Cedars, MD;  Location: [facility];  Service: Orthopedics;  Laterality: Right;   CARPAL TUNNEL RELEASE Right    COLONOSCOPY W/ BIOPSIES AND POLYPECTOMY     IR ANGIO EXTRACRAN SEL COM CAROTID INNOMINATE UNI R MOD SED  05/02/2017   IR GENERIC HISTORICAL  04/24/2016   IR TRANSCATH/EMBOLIZ 04/24/2016 Luanne Bras, MD [facility]   IR GENERIC HISTORICAL  04/24/2016   IR ANGIOGRAM FOLLOW UP STUDY 04/24/2016 Luanne Bras, MD [facility]   IR GENERIC HISTORICAL   04/24/2016   IR NEURO EACH ADD'L AFTER BASIC UNI RIGHT (MS) 04/24/2016 Luanne Bras, MD [facility]   IR GENERIC HISTORICAL  04/24/2016   IR ANGIO INTRA EXTRACRAN SEL INTERNAL CAROTID UNI R MOD SED 04/24/2016 Luanne Bras, MD [facility]   IR GENERIC HISTORICAL  05/19/2016   IR RADIOLOGIST EVAL & MGMT 05/19/2016 [facility]   IR GENERIC HISTORICAL  08/24/2016   IR ANGIO VERTEBRAL SEL SUBCLAVIAN INNOMINATE UNI R MOD SED 08/24/2016 Luanne Bras, MD [facility]   IR GENERIC HISTORICAL  08/24/2016   IR ANGIO INTRA EXTRACRAN SEL COM CAROTID INNOMINATE BILAT MOD SED 08/24/2016 Luanne Bras, MD [facility]   IR RADIOLOGIST EVAL & MGMT  11/29/2017   KNEE ARTHROSCOPY Right    LUMBAR DISC SURGERY     MOHS SURGERY     basal cell face   RADIOLOGY WITH ANESTHESIA N/A 04/24/2016   Procedure: EMBOLIZATION;  Surgeon: Luanne Bras, MD;  Location: [facility];  Service: Radiology;  Laterality: N/A;   RADIOLOGY WITH ANESTHESIA N/A 05/02/2017   Procedure: RADIOLOGY WITH ANESTHESIA     EMBOLIZATION;  Surgeon: Luanne Bras, MD;  Location: [facility];  Service: Radiology;  Laterality: N/A;   TONSILLECTOMY      Social History   Socioeconomic History   Marital status: Married  Spouse name: elizabeth   Number of children: 2   Years of education: Bach   Highest education level: Not on file  Occupational History   Occupation: [employer]  Tobacco Use   Smoking status: Former    Types: Cigarettes    Quit date: 05/01/1987    Years since quitting: 34.1   Smokeless tobacco: Never   Tobacco comments:    quit smoking 30 years ago 02/09/16  Vaping Use   Vaping Use: Never used  Substance and Sexual Activity   Alcohol use: Not Currently    Comment: occasional   Drug use: No   Sexual activity: Not on file  Other Topics Concern   Not on file  Social History Narrative   Lives at home with wife    Caffeine use- coffee, 5 cups daily   Social Determinants of Health   Financial Resource  Strain: Not on file  Food Insecurity: Not on file  Transportation Needs: Not on file  Physical Activity: Not on file  Stress: Not on file  Social Connections: Not on file  Intimate Partner Violence: Not on file    Current Outpatient Medications on File Prior to Visit  Medication Sig Dispense Refill   Ascorbic Acid (VITAMIN C) 1000 MG tablet Take 1,000 mg by mouth daily.     aspirin 81 MG tablet Take 81 mg by mouth daily.      calcium carbonate (TUMS - DOSED IN MG ELEMENTAL CALCIUM) 500 MG chewable tablet Chew 1 tablet by mouth daily as needed for indigestion or heartburn.     Fremanezumab-vfrm (AJOVY) 225 MG/1.5ML SOAJ Inject 225 mg into the skin every 30 (thirty) days.     hydrochlorothiazide (MICROZIDE) 12.5 MG capsule Take 12.5 mg by mouth daily.     pyridOXINE (VITAMIN B-6) 100 MG tablet Take 100 mg by mouth daily.     zinc gluconate 50 MG tablet Take 50 mg by mouth daily.     gabapentin (NEURONTIN) 300 MG capsule Take 600 mg by mouth at bedtime.     Current Facility-Administered Medications on File Prior to Visit  Medication Dose Route Frequency Provider Last Rate Last Admin   gadopentetate dimeglumine (MAGNEVIST) injection 20 mL  20 mL Intravenous Once PRN Penumalli, Earlean Polka, MD        Allergies  Allergen Reactions   Clopidogrel Other (See Comments)    " bleeding and chest burning" Burning chest and eye started bleeding   Keflex [Cephalexin] Anaphylaxis and Swelling    Throat swelling   Ivp Dye [Iodinated Diagnostic Agents] Hives    Dye used in the 80s    Verapamil Palpitations    AV BLOCK   Glipizide Palpitations and Other (See Comments)    "Makes heart feel funny"     Family History  Problem Relation Age of Onset   Seizures Mother    Schizophrenia Father    Cancer Paternal Grandfather    Diabetes Neg Hx     BP 130/78 (BP Location: Right Arm, Patient Position: Sitting, Cuff Size: Large)   Pulse 69   Ht 5' 7.5" (1.715 m)   Wt 239 lb 9.6 oz (108.7 kg)   SpO2  99%   BMI 36.97 kg/m    Review of Systems denies weight loss, n/v, and memory loss.      Objective:   Physical Exam Pulses: dorsalis pedis intact bilat.   MSK: no deformity of the feet CV: no leg edema Skin:  no ulcer on the feet.  normal color  and temp on the feet. Neuro: sensation is intact to touch on the feet.    I have reviewed outside records, and summarized: Pt was noted to have elevated A1c, and referred here.  Wellness, dyslipidemia, and HTN were also addressed.    Lab Results  Component Value Date   HGBA1C 9.1 (A) 06/09/2021      Assessment & Plan:  Type 2 DM: uncontrolled Obesity: uncontrolled.   Patient Instructions  good diet and exercise significantly improve the control of your diabetes.  please let me know if you wish to be referred to a dietician.  high blood sugar is very risky to your health.  you should see an eye doctor and dentist every year.  It is very important to get all recommended vaccinations.  Controlling your blood pressure and cholesterol drastically reduces the damage diabetes does to your body.  Those who smoke should quit.  Please discuss these with your doctor.  check your blood sugar once a day.  vary the time of day when you check, between before the 3 meals, and at bedtime.  also check if you have symptoms of your blood sugar being too high or too low.  please keep a record of the readings and bring it to your next appointment here (or you can bring the meter itself).  You can write it on any piece of paper.  please call us sooner if your blood sugar goes below 70, or if most of your readings are over 200.     Bariatric Surgery You have so much to gain by losing weight.  You may have already tried every diet and exercise plan imaginable.  And, you may have sought advice from your family physician, too.   Sometimes, in spite of such diligent efforts, you may not be able to achieve long-term results by yourself.  In cases of severe obesity,  bariatric or weight loss surgery is a proven method of achieving long-term weight control. I have sent 2 prescriptions to your pharmacy: to add Trulicity, and to change the metformin to extended-release. Please continue the same Jardiance. Please call or message [place] in 2 weeks, to tell us how the blood sugar is doing, and if you have any nausea/heartburn Please come back for a follow-up appointment in 2 months.   Our Services Our bariatric surgery programs offer our patients new hope and long-term weight-loss solution.  Since introducing our services in 2003, we have conducted more than 2,400 successful procedures.  Our program is designated as a Programmer, multimedia by the Metabolic and Bariatric Surgery Accreditation and Quality Improvement Program (MBSAQIP), a IT trainer that sets rigorous patient safety and outcome standards.  Our program is also designated as a Ecologist by [employer].   Our exceptional weight-loss surgery team specializes in diagnosis, treatment, follow-up care, and ongoing support for our patients with severe weight loss challenges.  We currently offer laparoscopic sleeve gastrectomy, gastric bypass, and adjustable gastric band (LAP-BAND).    Attend our [place] Choosing to undergo a bariatric procedure is a big decision, and one that should not be taken lightly.  You now have two options in how you learn about weight-loss surgery - in person or online.  Our objective is to ensure you have all of the information that you need to evaluate the advantages and obligations of this life changing procedure.  Please note that you are not alone in this process, and our experienced team is ready to assist  and answer all of your questions.  There are several ways to register for a seminar (either on-line or in person):  Call [phone] Go on-line to [facility] and register for either type of seminar.   [email or web address]

## 2021-08-11 ENCOUNTER — Ambulatory Visit (INDEPENDENT_AMBULATORY_CARE_PROVIDER_SITE_OTHER): Payer: No Typology Code available for payment source | Admitting: Endocrinology

## 2021-08-11 ENCOUNTER — Other Ambulatory Visit: Payer: Self-pay

## 2021-08-11 VITALS — BP 130/84 | HR 77 | Ht 67.5 in | Wt 243.6 lb

## 2021-08-11 DIAGNOSIS — E118 Type 2 diabetes mellitus with unspecified complications: Secondary | ICD-10-CM | POA: Diagnosis not present

## 2021-08-11 LAB — POCT GLYCOSYLATED HEMOGLOBIN (HGB A1C): Hemoglobin A1C: 8.1 % — AB (ref 4.0–5.6)

## 2021-08-11 MED ORDER — TRULICITY 1.5 MG/0.5ML ~~LOC~~ SOAJ: 1.5000 mg | SUBCUTANEOUS | 3 refills | Status: DC

## 2021-08-11 NOTE — Progress Notes (Signed)
Subjective:    Patient ID: Nicholas Wheeler, male    DOB: February 03, 1961, 60 y.o.   MRN: [id number]  HPI Pt returns for f/u of diabetes mellitus: DM type: 2 Dx'ed: 2706 Complications: PN Therapy: Trulicity and 2 oral meds DKA: never Severe hypoglycemia: never Pancreatitis: never Pancreatic imaging: normal on 2020 CT SDOH: He could not afford Mounjaro. Other: he has never been on insulin; Interval history: Pt says cbg varies from 120-169.  No recent steroids.  He takes meds as rx'ed.  pt states he feels well in general.  Past Surgical History:  Procedure Laterality Date   ARTHOSCOPIC ROTAOR CUFF REPAIR Right 01/07/2018   Procedure: RIGHT SHOULDER ARTHROSCOPY, SUBACROMIAL DECOMPRESSION, OPEN ROTATOR CUFF REPAIR, OPEN DISTAL CLAVICLE EXCISION, BICEPS TENODESIS, SLAP DEBRIDMENT;  Surgeon: Netta Cedars, MD;  Location: [facility];  Service: Orthopedics;  Laterality: Right;   CARPAL TUNNEL RELEASE Right    COLONOSCOPY W/ BIOPSIES AND POLYPECTOMY     IR ANGIO EXTRACRAN SEL COM CAROTID INNOMINATE UNI R MOD SED  05/02/2017   IR GENERIC HISTORICAL  04/24/2016   IR TRANSCATH/EMBOLIZ 04/24/2016 Luanne Bras, MD [facility]   IR GENERIC HISTORICAL  04/24/2016   IR ANGIOGRAM FOLLOW UP STUDY 04/24/2016 Luanne Bras, MD [facility]   IR GENERIC HISTORICAL  04/24/2016   IR NEURO EACH ADD'L AFTER BASIC UNI RIGHT (MS) 04/24/2016 Luanne Bras, MD [facility]   IR GENERIC HISTORICAL  04/24/2016   IR ANGIO INTRA EXTRACRAN SEL INTERNAL CAROTID UNI R MOD SED 04/24/2016 Luanne Bras, MD [facility]   IR GENERIC HISTORICAL  05/19/2016   IR RADIOLOGIST EVAL & MGMT 05/19/2016 [facility]   IR GENERIC HISTORICAL  08/24/2016   IR ANGIO VERTEBRAL SEL SUBCLAVIAN INNOMINATE UNI R MOD SED 08/24/2016 Luanne Bras, MD [facility]   IR GENERIC HISTORICAL  08/24/2016   IR ANGIO INTRA EXTRACRAN SEL COM CAROTID INNOMINATE BILAT MOD SED 08/24/2016 Luanne Bras, MD [facility]   IR RADIOLOGIST  EVAL & MGMT  11/29/2017   KNEE ARTHROSCOPY Right    LUMBAR DISC SURGERY     MOHS SURGERY     basal cell face   RADIOLOGY WITH ANESTHESIA N/A 04/24/2016   Procedure: EMBOLIZATION;  Surgeon: Luanne Bras, MD;  Location: [facility];  Service: Radiology;  Laterality: N/A;   RADIOLOGY WITH ANESTHESIA N/A 05/02/2017   Procedure: RADIOLOGY WITH ANESTHESIA     EMBOLIZATION;  Surgeon: Luanne Bras, MD;  Location: [facility];  Service: Radiology;  Laterality: N/A;   TONSILLECTOMY      Social History   Socioeconomic History   Marital status: Married    Spouse name: elizabeth   Number of children: 2   Years of education: Bach   Highest education level: Not on file  Occupational History   Occupation: [employer]  Tobacco Use   Smoking status: Former    Types: Cigarettes    Quit date: 05/01/1987    Years since quitting: 34.3   Smokeless tobacco: Never   Tobacco comments:    quit smoking 30 years ago 02/09/16  Vaping Use   Vaping Use: Never used  Substance and Sexual Activity   Alcohol use: Not Currently    Comment: occasional   Drug use: No   Sexual activity: Not on file  Other Topics Concern   Not on file  Social History Narrative   Lives at home with wife    Caffeine use- coffee, 5 cups daily   Social Determinants of Health   Financial Resource Strain: Not  on file  Food Insecurity: Not on file  Transportation Needs: Not on file  Physical Activity: Not on file  Stress: Not on file  Social Connections: Not on file  Intimate Partner Violence: Not on file    Current Outpatient Medications on File Prior to Visit  Medication Sig Dispense Refill   Ascorbic Acid (VITAMIN C) 1000 MG tablet Take 1,000 mg by mouth daily.     aspirin 81 MG tablet Take 81 mg by mouth daily.      calcium carbonate (TUMS - DOSED IN MG ELEMENTAL CALCIUM) 500 MG chewable tablet Chew 1 tablet by mouth daily as needed for indigestion or heartburn.     empagliflozin (JARDIANCE) 25 MG TABS tablet Take 1 tablet (25 mg  total) by mouth daily before breakfast. 90 tablet 3   Fremanezumab-vfrm (AJOVY) 225 MG/1.5ML SOAJ Inject 225 mg into the skin every 30 (thirty) days.     hydrochlorothiazide (MICROZIDE) 12.5 MG capsule Take 12.5 mg by mouth daily.     metFORMIN (GLUCOPHAGE-XR) 500 MG 24 hr tablet Take 4 tablets (2,000 mg total) by mouth daily. 360 tablet 3   pyridOXINE (VITAMIN B-6) 100 MG tablet Take 100 mg by mouth daily.     zinc gluconate 50 MG tablet Take 50 mg by mouth daily.     gabapentin (NEURONTIN) 300 MG capsule Take 600 mg by mouth at bedtime.     Current Facility-Administered Medications on File Prior to Visit  Medication Dose Route Frequency Provider Last Rate Last Admin   gadopentetate dimeglumine (MAGNEVIST) injection 20 mL  20 mL Intravenous Once PRN Penumalli, Earlean Polka, MD        Allergies  Allergen Reactions   Clopidogrel Other (See Comments)    " bleeding and chest burning" Burning chest and eye started bleeding   Keflex [Cephalexin] Anaphylaxis and Swelling    Throat swelling   Ivp Dye [Iodinated Diagnostic Agents] Hives    Dye used in the 80s    Verapamil Palpitations    AV BLOCK   Glipizide Palpitations and Other (See Comments)    "Makes heart feel funny"     Family History  Problem Relation Age of Onset   Seizures Mother    Schizophrenia Father    Cancer Paternal Grandfather    Diabetes Neg Hx     BP 130/84   Pulse 77   Ht 5' 7.5" (1.715 m)   Wt 243 lb 9.6 oz (110.5 kg)   SpO2 97%   BMI 37.59 kg/m   Review of Systems Denies N/V/HB    Objective:   Physical Exam    Lab Results  Component Value Date   HGBA1C 8.1 (A) 08/11/2021      Assessment & Plan:  Type 2 DM: uncontrolled  Patient Instructions  check your blood sugar once a day.  vary the time of day when you check, between before the 3 meals, and at bedtime.  also check if you have symptoms of your blood sugar being too high or too low.  please keep a record of the readings and bring it to your  next appointment here (or you can bring the meter itself).  You can write it on any piece of paper.  please call us sooner if your blood sugar goes below 70, or if most of your readings are over 200.   I have sent a prescription to your pharmacy, to increase the Trulicity.   Please continue the same other medications.   Please come back  for a follow-up appointment in 2 months.

## 2021-08-11 NOTE — Patient Instructions (Addendum)
check your blood sugar once a day.  vary the time of day when you check, between before the 3 meals, and at bedtime.  also check if you have symptoms of your blood sugar being too high or too low.  please keep a record of the readings and bring it to your next appointment here (or you can bring the meter itself).  You can write it on any piece of paper.  please call us sooner if your blood sugar goes below 70, or if most of your readings are over 200.   I have sent a prescription to your pharmacy, to increase the Trulicity.   Please continue the same other medications.   Please come back for a follow-up appointment in 2 months.

## 2021-10-13 ENCOUNTER — Other Ambulatory Visit: Payer: Self-pay

## 2021-10-13 ENCOUNTER — Encounter: Payer: Self-pay | Admitting: Endocrinology

## 2021-10-13 ENCOUNTER — Ambulatory Visit (INDEPENDENT_AMBULATORY_CARE_PROVIDER_SITE_OTHER): Payer: No Typology Code available for payment source | Admitting: Endocrinology

## 2021-10-13 VITALS — BP 118/70 | HR 82 | Ht 67.5 in | Wt 240.2 lb

## 2021-10-13 DIAGNOSIS — E118 Type 2 diabetes mellitus with unspecified complications: Secondary | ICD-10-CM

## 2021-10-13 LAB — POCT GLYCOSYLATED HEMOGLOBIN (HGB A1C): Hemoglobin A1C: 7.9 % — AB (ref 4.0–5.6)

## 2021-10-13 MED ORDER — TRULICITY 3 MG/0.5ML ~~LOC~~ SOAJ: 3.0000 mg | SUBCUTANEOUS | 3 refills | Status: DC

## 2021-10-13 MED ORDER — FREESTYLE LIBRE 2 SENSOR MISC: 1.0000 | 3 refills | Status: DC

## 2021-10-13 NOTE — Progress Notes (Signed)
Subjective:    Patient ID: Nicholas Wheeler, male    DOB: [birth year], 61 y.o.   MRN: [id number]  HPI Pt returns for f/u of diabetes mellitus: DM type: 2 Dx'ed: 1017 Complications: PN Therapy: Trulicity and 2 oral meds. DKA: never Severe hypoglycemia: never Pancreatitis: never Pancreatic imaging: normal on 2020 CT SDOH: He could not afford Mounjaro. Other: he has never been on insulin.  Interval history: Pt says cbg varies from 120-169.  No recent steroids.  He takes meds as rx'ed.  pt states he feels well in general.  He requests continuous glucose monitor.   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   Diabetes ([place])    Type II   Diabetic neuropathy ([facility])    GERD (gastroesophageal reflux disease)    History of hiatal hernia    Hypersomnia with sleep apnea, unspecified 09/08/2013   Joint disorder    degenerated   Migraine    Obesity (BMI 35.0-39.9 without comorbidity)    Recurrent sinus infections    Skin cancer    Sleep apnea    saw Dr Brett Fairy in 09/2013; wears CPAP set at 14   TMJ (temporomandibular joint disorder)     Past Surgical History:  Procedure Laterality Date   ARTHOSCOPIC ROTAOR CUFF REPAIR Right 01/07/2018   Procedure: RIGHT SHOULDER ARTHROSCOPY, SUBACROMIAL DECOMPRESSION, OPEN ROTATOR CUFF REPAIR, OPEN DISTAL CLAVICLE EXCISION, BICEPS TENODESIS, SLAP DEBRIDMENT;  Surgeon: Netta Cedars, MD;  Location: [facility] [place];  Service: Orthopedics;  Laterality: Right;   CARPAL TUNNEL RELEASE Right    COLONOSCOPY W/ BIOPSIES AND POLYPECTOMY     IR ANGIO EXTRACRAN SEL COM CAROTID INNOMINATE UNI R MOD SED  05/02/2017   IR GENERIC HISTORICAL  04/24/2016   IR TRANSCATH/EMBOLIZ 04/24/2016 Luanne Bras, MD [facility]   IR GENERIC HISTORICAL  04/24/2016   IR ANGIOGRAM FOLLOW UP  STUDY 04/24/2016 Luanne Bras, MD [facility]   IR GENERIC HISTORICAL  04/24/2016   IR NEURO EACH ADD'L AFTER BASIC UNI RIGHT (MS) 04/24/2016 Luanne Bras, MD [facility]   IR GENERIC HISTORICAL  04/24/2016   IR ANGIO INTRA EXTRACRAN SEL INTERNAL CAROTID UNI R MOD SED 04/24/2016 Luanne Bras, MD [facility]   IR GENERIC HISTORICAL  05/19/2016   IR RADIOLOGIST EVAL & MGMT 05/19/2016 [facility]   IR GENERIC HISTORICAL  08/24/2016   IR ANGIO VERTEBRAL SEL SUBCLAVIAN INNOMINATE UNI R MOD SED 08/24/2016 Luanne Bras, MD [facility]   IR GENERIC HISTORICAL  08/24/2016   IR ANGIO INTRA EXTRACRAN SEL COM CAROTID INNOMINATE BILAT MOD SED 08/24/2016 Luanne Bras, MD [facility]   IR RADIOLOGIST EVAL & MGMT  11/29/2017   KNEE ARTHROSCOPY Right    LUMBAR DISC SURGERY     MOHS SURGERY     basal cell face   RADIOLOGY WITH ANESTHESIA N/A 04/24/2016   Procedure: EMBOLIZATION;  Surgeon: Luanne Bras, MD;  Location: [facility];  Service: Radiology;  Laterality: N/A;   RADIOLOGY WITH ANESTHESIA N/A 05/02/2017   Procedure: RADIOLOGY WITH ANESTHESIA     EMBOLIZATION;  Surgeon: Luanne Bras, MD;  Location: [facility];  Service: Radiology;  Laterality: N/A;   TONSILLECTOMY      Social History   Socioeconomic History   Marital status: Married  Spouse name: elizabeth   Number of children: 2   Years of education: Bach   Highest education level: Not on file  Occupational History   Occupation: [employer]  Tobacco Use   Smoking status: Former    Types: Cigarettes    Quit date: 05/01/1987    Years since quitting: 34.4   Smokeless tobacco: Never   Tobacco comments:    quit smoking 30 years ago 02/09/16  Vaping Use   Vaping Use: Never used  Substance and Sexual Activity   Alcohol use: Not Currently    Comment: occasional   Drug use: No   Sexual activity: Not on file  Other Topics Concern   Not on file  Social History Narrative   Lives at home with wife    Caffeine use-  coffee, 5 cups daily   Social Determinants of Health   Financial Resource Strain: Not on file  Food Insecurity: Not on file  Transportation Needs: Not on file  Physical Activity: Not on file  Stress: Not on file  Social Connections: Not on file  Intimate Partner Violence: Not on file    Current Outpatient Medications on File Prior to Visit  Medication Sig Dispense Refill   Ascorbic Acid (VITAMIN C) 1000 MG tablet Take 1,000 mg by mouth daily.     aspirin 81 MG tablet Take 81 mg by mouth daily.      calcium carbonate (TUMS - DOSED IN MG ELEMENTAL CALCIUM) 500 MG chewable tablet Chew 1 tablet by mouth daily as needed for indigestion or heartburn.     empagliflozin (JARDIANCE) 25 MG TABS tablet Take 1 tablet (25 mg total) by mouth daily before breakfast. 90 tablet 3   Fremanezumab-vfrm (AJOVY) 225 MG/1.5ML SOAJ Inject 225 mg into the skin every 30 (thirty) days.     hydrochlorothiazide (MICROZIDE) 12.5 MG capsule Take 12.5 mg by mouth daily.     metFORMIN (GLUCOPHAGE-XR) 500 MG 24 hr tablet Take 4 tablets (2,000 mg total) by mouth daily. 360 tablet 3   pyridOXINE (VITAMIN B-6) 100 MG tablet Take 100 mg by mouth daily.     zinc gluconate 50 MG tablet Take 50 mg by mouth daily.     gabapentin (NEURONTIN) 300 MG capsule Take 600 mg by mouth at bedtime.     Current Facility-Administered Medications on File Prior to Visit  Medication Dose Route Frequency Provider Last Rate Last Admin   gadopentetate dimeglumine (MAGNEVIST) injection 20 mL  20 mL Intravenous Once PRN Penumalli, Earlean Polka, MD        Allergies  Allergen Reactions   Clopidogrel Other (See Comments)    " bleeding and chest burning" Burning chest and eye started bleeding   Keflex [Cephalexin] Anaphylaxis and Swelling    Throat swelling   Ivp Dye [Iodinated Contrast Media] Hives    Dye used in the 80s    Verapamil Palpitations    AV BLOCK   Glipizide Palpitations and Other (See Comments)    "Makes heart feel funny"      Family History  Problem Relation Age of Onset   Seizures Mother    Schizophrenia Father    Cancer Paternal Grandfather    Diabetes Neg Hx     BP 118/70 (BP Location: Left Arm, Patient Position: Sitting, Cuff Size: Normal)    Pulse 82    Ht 5' 7.5" (1.715 m)    Wt 240 lb 3.2 oz (109 kg)    SpO2 96%    BMI 37.07 kg/m  Review of Systems Denies N/V/HB    Objective:   Physical Exam   A1c=7.9%    Assessment & Plan:  Type 2 DM: uncontrolled  Patient Instructions  check your blood sugar once a day.  vary the time of day when you check, between before the 3 meals, and at bedtime.  also check if you have symptoms of your blood sugar being too high or too low.  please keep a record of the readings and bring it to your next appointment here (or you can bring the meter itself).  You can write it on any piece of paper.  please call us sooner if your blood sugar goes below 70, or if most of your readings are over 200.   I have sent a prescription to your pharmacy, to increase the Trulicity again.   Please continue the same other medications.   Please come back for a follow-up appointment in 2 months.

## 2021-10-13 NOTE — Patient Instructions (Addendum)
check your blood sugar once a day.  vary the time of day when you check, between before the 3 meals, and at bedtime.  also check if you have symptoms of your blood sugar being too high or too low.  please keep a record of the readings and bring it to your next appointment here (or you can bring the meter itself).  You can write it on any piece of paper.  please call us sooner if your blood sugar goes below 70, or if most of your readings are over 200.   I have sent a prescription to your pharmacy, to increase the Trulicity again.   Please continue the same other medications.   Please come back for a follow-up appointment in 2 months.

## 2021-11-03 ENCOUNTER — Encounter: Payer: Self-pay | Admitting: Endocrinology

## 2021-11-04 ENCOUNTER — Other Ambulatory Visit: Payer: Self-pay | Admitting: Endocrinology

## 2021-11-04 MED ORDER — TRULICITY 1.5 MG/0.5ML ~~LOC~~ SOAJ: 1.5000 mg | SUBCUTANEOUS | 3 refills | Status: DC

## 2021-12-19 ENCOUNTER — Encounter: Payer: Self-pay | Admitting: Endocrinology

## 2021-12-19 ENCOUNTER — Ambulatory Visit (INDEPENDENT_AMBULATORY_CARE_PROVIDER_SITE_OTHER): Payer: No Typology Code available for payment source | Admitting: Endocrinology

## 2021-12-19 VITALS — BP 138/82 | HR 93 | Ht 67.5 in | Wt 234.0 lb

## 2021-12-19 DIAGNOSIS — E118 Type 2 diabetes mellitus with unspecified complications: Secondary | ICD-10-CM | POA: Diagnosis not present

## 2021-12-19 LAB — POCT GLYCOSYLATED HEMOGLOBIN (HGB A1C): Hemoglobin A1C: 7 % — AB (ref 4.0–5.6)

## 2021-12-19 MED ORDER — DEXCOM G6 TRANSMITTER MISC: 1.0000 | Freq: Once | 1 refills | Status: AC

## 2021-12-19 MED ORDER — DEXCOM G6 RECEIVER DEVI: 1.0000 | Freq: Once | 1 refills | Status: AC

## 2021-12-19 MED ORDER — DEXCOM G6 SENSOR MISC: 1.0000 | 3 refills | Status: DC

## 2021-12-19 NOTE — Patient Instructions (Addendum)
check your blood sugar once a day.  vary the time of day when you check, between before the 3 meals, and at bedtime.  also check if you have symptoms of your blood sugar being too high or too low.  please keep a record of the readings and bring it to your next appointment here (or you can bring the meter itself).  You can write it on any piece of paper.  please call us sooner if your blood sugar goes below 70, or if most of your readings are over 200.   ?Please continue the same 3 diabetes medications.   ?You should have an endocrinology follow-up appointment in 4 months.   ? ?

## 2021-12-19 NOTE — Progress Notes (Signed)
? ?Subjective:  ? ? Patient ID: Nicholas Wheeler, male    DOB: [birth year], 60 y.o.   MRN: [id number] ? ?HPI ?Pt returns for f/u of diabetes mellitus: ?DM type: 2 ?Dx'ed: 2011 ?Complications: PN ?Therapy: Trulicity and 2 oral meds.   ?DKA: never ?Severe hypoglycemia: never.   ?Pancreatitis: never ?Pancreatic imaging: normal on 2020 CT.   ?SDOH: He could not afford Mounjaro.   ?Other: he has never been on insulin.   ?Interval history: No recent steroids.  Trulicity was reduced due to hypoglycemia.  Since then, pt says glucose varies from 90-140.  We are unable to connect to continuous glucose monitor.   ?Past Medical History:  ?Diagnosis Date  ? 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   ? Arthritis   ? AV block 2010  ? caused by verapamil--given for migraines no problems since   ? Complication of anesthesia   ? "woke up violently after last arteriogram " ?  ? Diabetes ([place])   ? Type II  ? Diabetic neuropathy ([place])   ? GERD (gastroesophageal reflux disease)   ? History of hiatal hernia   ? Hypersomnia with sleep apnea, unspecified 09/08/2013  ? Joint disorder   ? degenerated  ? Migraine   ? Obesity (BMI 35.0-39.9 without comorbidity)   ? Recurrent sinus infections   ? Skin cancer   ? Sleep apnea   ? saw Dr Brett Fairy in 09/2013; wears CPAP set at 14  ? TMJ (temporomandibular joint disorder)   ? ? ?Past Surgical History:  ?Procedure Laterality Date  ? ARTHOSCOPIC ROTAOR CUFF REPAIR Right 01/07/2018  ? Procedure: RIGHT SHOULDER ARTHROSCOPY, SUBACROMIAL DECOMPRESSION, OPEN ROTATOR CUFF REPAIR, OPEN DISTAL CLAVICLE EXCISION, BICEPS TENODESIS, SLAP DEBRIDMENT;  Surgeon: Netta Cedars, MD;  Location: [place];  Service: Orthopedics;  Laterality: Right;  ? CARPAL TUNNEL RELEASE Right   ? COLONOSCOPY W/ BIOPSIES AND POLYPECTOMY    ? IR ANGIO EXTRACRAN SEL COM CAROTID INNOMINATE UNI R MOD SED  05/02/2017  ? IR GENERIC HISTORICAL  04/24/2016  ? IR TRANSCATH/EMBOLIZ 04/24/2016 Luanne Bras, MD [facility]  ? IR GENERIC HISTORICAL  04/24/2016  ?  IR ANGIOGRAM FOLLOW UP STUDY 04/24/2016 Luanne Bras, MD [facility]  ? IR GENERIC HISTORICAL  04/24/2016  ? IR NEURO EACH ADD'L AFTER BASIC UNI RIGHT (MS) 04/24/2016 Luanne Bras, MD [facility]  ? IR GENERIC HISTORICAL  04/24/2016  ? IR ANGIO INTRA EXTRACRAN SEL INTERNAL CAROTID UNI R MOD SED 04/24/2016 Luanne Bras, MD [facility]  ? IR GENERIC HISTORICAL  05/19/2016  ? IR RADIOLOGIST EVAL & MGMT 05/19/2016 [facility]  ? IR GENERIC HISTORICAL  08/24/2016  ? IR ANGIO VERTEBRAL SEL SUBCLAVIAN INNOMINATE UNI R MOD SED 08/24/2016 Luanne Bras, MD [facility]  ? IR GENERIC HISTORICAL  08/24/2016  ? IR ANGIO INTRA EXTRACRAN SEL COM CAROTID INNOMINATE BILAT MOD SED 08/24/2016 Luanne Bras, MD [facility]  ? IR RADIOLOGIST EVAL & MGMT  11/29/2017  ? KNEE ARTHROSCOPY Right   ? LUMBAR DISC SURGERY    ? MOHS SURGERY    ? basal cell face  ? RADIOLOGY WITH ANESTHESIA N/A 04/24/2016  ? Procedure: EMBOLIZATION;  Surgeon: Luanne Bras, MD;  Location: [place];  Service: Radiology;  Laterality: N/A;  ? RADIOLOGY WITH ANESTHESIA N/A 05/02/2017  ? Procedure: RADIOLOGY WITH ANESTHESIA     EMBOLIZATION;  Surgeon: Luanne Bras, MD;  Location: [place];  Service: Radiology;  Laterality: N/A;  ? TONSILLECTOMY    ? ? ?Social History  ? ?  Socioeconomic History  ? Marital status: Married  ?  Spouse name: elizabeth  ? Number of children: 2  ? Years of education: [name]  ? Highest education level: Not on file  ?Occupational History  ? Occupation: [employer]  ?Tobacco Use  ? Smoking status: Former  ?  Types: Cigarettes  ?  Quit date: 05/01/1987  ?  Years since quitting: 34.6  ? Smokeless tobacco: Never  ? Tobacco comments:  ?  quit smoking 30 years ago 02/09/16  ?Vaping Use  ? Vaping Use: Never used  ?Substance and Sexual Activity  ? Alcohol use: Not Currently  ?  Comment: occasional  ? Drug use: No  ? Sexual activity: Not on file  ?Other Topics Concern  ? Not on file  ?Social History Narrative  ? Lives at home with wife    ? Caffeine use- coffee, 5 cups daily  ? ?Social Determinants of Health  ? ?Financial Resource Strain: Not on file  ?Food Insecurity: Not on file  ?Transportation Needs: Not on file  ?Physical Activity: Not on file  ?Stress: Not on file  ?Social Connections: Not on file  ?Intimate Partner Violence: Not on file  ? ? ?Current Outpatient Medications on File Prior to Visit  ?Medication Sig Dispense Refill  ? Ascorbic Acid (VITAMIN C) 1000 MG tablet Take 1,000 mg by mouth daily.    ? aspirin 81 MG tablet Take 81 mg by mouth daily.     ? calcium carbonate (TUMS - DOSED IN MG ELEMENTAL CALCIUM) 500 MG chewable tablet Chew 1 tablet by mouth daily as needed for indigestion or heartburn.    ? Continuous Blood Gluc Sensor (FREESTYLE LIBRE 2 SENSOR) MISC 1 Device by Does not apply route every 14 (fourteen) days. 6 each 3  ? Dulaglutide (TRULICITY) 1.5 JS/9.7WY SOPN Inject 1.5 mg into the skin once a week. 6 mL 3  ? empagliflozin (JARDIANCE) 25 MG TABS tablet Take 1 tablet (25 mg total) by mouth daily before breakfast. 90 tablet 3  ? Fremanezumab-vfrm (AJOVY) 225 MG/1.5ML SOAJ Inject 225 mg into the skin every 30 (thirty) days.    ? hydrochlorothiazide (MICROZIDE) 12.5 MG capsule Take 12.5 mg by mouth daily.    ? metFORMIN (GLUCOPHAGE-XR) 500 MG 24 hr tablet Take 4 tablets (2,000 mg total) by mouth daily. 360 tablet 3  ? pyridOXINE (VITAMIN B-6) 100 MG tablet Take 100 mg by mouth daily.    ? zinc gluconate 50 MG tablet Take 50 mg by mouth daily.    ? ?Current Facility-Administered Medications on File Prior to Visit  ?Medication Dose Route Frequency Provider Last Rate Last Admin  ? gadopentetate dimeglumine (MAGNEVIST) injection 20 mL  20 mL Intravenous Once PRN Penumalli, Earlean Polka, MD      ? ? ?Allergies  ?Allergen Reactions  ? Clopidogrel Other (See Comments)  ?  " bleeding and chest burning" ?Burning chest and eye started bleeding  ? Keflex [Cephalexin] Anaphylaxis and Swelling  ?  Throat swelling  ? Ivp Dye [Iodinated  Contrast Media] Hives  ?  Dye used in the 80s   ? Verapamil Palpitations  ?  AV BLOCK  ? Glipizide Palpitations and Other (See Comments)  ?  "Makes heart feel funny" ?  ? ? ?Family History  ?Problem Relation Age of Onset  ? Seizures Mother   ? Schizophrenia Father   ? Cancer Paternal Grandfather   ? Diabetes Neg Hx   ? ? ?BP 138/82 (BP Location: Left Arm, Patient Position: Sitting, Cuff  Size: Normal)   Pulse 93   Ht 5' 7.5" (1.715 m)   Wt 234 lb (106.1 kg)   SpO2 96%   BMI 36.11 kg/m?  ? ? ?Review of Systems ?Denies N/HB.  He has lost a few lbs.   ?   ?Objective:  ? Physical Exam ?VITAL SIGNS:  See vs page ?GENERAL: no distress.   ? ? ?Lab Results  ?Component Value Date  ? HGBA1C 7.0 (A) 12/19/2021  ? ?   ?Assessment & Plan:  ?Type 2 DM: well-controlled ?Hypoglycemia, due to Trulicity.  this limits aggressiveness of glycemic control ? ?Patient Instructions  ?check your blood sugar once a day.  vary the time of day when you check, between before the 3 meals, and at bedtime.  also check if you have symptoms of your blood sugar being too high or too low.  please keep a record of the readings and bring it to your next appointment here (or you can bring the meter itself).  You can write it on any piece of paper.  please call us sooner if your blood sugar goes below 70, or if most of your readings are over 200.   ?Please continue the same 3 diabetes medications.   ?You should have an endocrinology follow-up appointment in 4 months.   ? ? ? ?

## 2022-03-24 ENCOUNTER — Ambulatory Visit (INDEPENDENT_AMBULATORY_CARE_PROVIDER_SITE_OTHER): Payer: No Typology Code available for payment source | Admitting: Internal Medicine

## 2022-03-24 ENCOUNTER — Encounter: Payer: Self-pay | Admitting: Internal Medicine

## 2022-03-24 VITALS — BP 140/88 | HR 85 | Ht 67.5 in | Wt 233.2 lb

## 2022-03-24 DIAGNOSIS — E1165 Type 2 diabetes mellitus with hyperglycemia: Secondary | ICD-10-CM | POA: Diagnosis not present

## 2022-03-24 DIAGNOSIS — E785 Hyperlipidemia, unspecified: Secondary | ICD-10-CM | POA: Diagnosis not present

## 2022-03-24 DIAGNOSIS — E1142 Type 2 diabetes mellitus with diabetic polyneuropathy: Secondary | ICD-10-CM

## 2022-03-24 LAB — POCT GLYCOSYLATED HEMOGLOBIN (HGB A1C): Hemoglobin A1C: 7 % — AB (ref 4.0–5.6)

## 2022-03-24 NOTE — Progress Notes (Signed)
Patient ID: Nicholas Wheeler, male   DOB: Feb 01, 1961, 61 y.o.   MRN: [id number]  HPI: Nicholas Wheeler is a 61 y.o.-year-old male, returning for follow-up for DM2, dx in 2011, non-insulin-dependent, uncontrolled, with complications (peripheral neuropathy). Pt. previously saw Dr. Loanne Drilling, last visit 3 months ago.  Reviewed HbA1c: Lab Results  Component Value Date   HGBA1C 7.0 (A) 12/19/2021   HGBA1C 7.9 (A) 10/13/2021   HGBA1C 8.1 (A) 08/11/2021   HGBA1C 9.1 (A) 06/09/2021   HGBA1C 6.9 (H) 04/30/2017   HGBA1C 7.9 (H) 04/19/2016   HGBA1C 6.8 (H) 02/09/2016  02/25/2021: HbA1c 8.3%  Pt is on a regimen of: - Metformin ER 2000 mg daily >> 1000-500-500 mg  - Jardiance 25 mg daily - Trulicity 3 >> 1.5 mg weekly (dose decreased because of lows) He was on glipizide in the past but this caused palpitations.  Pt checks his sugars >4x a day with his CGM:   Lowest sugar was 60; he has hypoglycemia awareness at 100.  Highest sugar was 300 (pancakes).  Glucometer: Accu-Chek  - no CKD, last BUN/creatinine:   2021-02-25     Albumin 4.3   3.4-4.8  ALP 50   38-126  ALT 30   0-52  Anion Gap 12.2   6.0-20.0  AST 21   0-39  BUN 12   6-26  CO2 32   22-32  CA-corrected 9.40   8.60-10.30  Calcium 9.7   8.6-10.3  Chloride 99   98-107  Creatinine 0.87   0.60-1.30  eGFR2021 98   >60  Glucose 132   70-99  Potassium 4.2   3.5-5.5  Sodium 139   136-145  Protein, Total 7.1   6.0-8.3  TBIL 0.5   0.3-1.0   Microalbumin Panel   2021-02-25    MA/CR ratio <10.0   0.0-30.0  UCR 70      UMA <0.7       Lab Results  Component Value Date   BUN 12 12/30/2018   BUN 9 01/07/2018   CREATININE 1.26 (H) 12/30/2018   CREATININE 0.93 01/07/2018   -+ Mixed HL; last set of lipids: Lipid Panel w/reflex   2021-02-25    LDL Chol Calc (NIH) 69   0-99  CHOL/HDL 4.0   2.0-4.0  Cholesterol 162   <200  HDLD 41   30-70  LDL Chol Calc (NIH) 69   0-99  NHDL 121   0-129  Triglyceride 329   0-199   No results  found for: "CHOL", "HDL", "LDLCALC", "LDLDIRECT", "TRIG", "CHOLHDL"  - last eye exam was: 2022: NO DR reportedly, prev.: EYE EXAM   2020-03-30    Y/N Retinopathy N      - + numbness and tingling in his feet.  Last foot exam 06/09/2021.  He also has a history of OSA, obesity class III, hepatic steatosis, right MCA aneurysm, migraine, GERD.  ROS: + see HPI No increased urination, blurry vision, nausea, chest pain.  Past Medical History:  Diagnosis Date   Aneurysm ([place])    Right MCA.    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   Diabetes ([place])    Type II   Diabetic neuropathy ([facility])    GERD (gastroesophageal reflux disease)    History of hiatal hernia    Hypersomnia with sleep apnea, unspecified 09/08/2013   Joint disorder  degenerated   Migraine    Obesity (BMI 35.0-39.9 without comorbidity)    Recurrent sinus infections    Skin cancer    Sleep apnea    saw Dr Brett Fairy in 09/2013; wears CPAP set at 14   TMJ (temporomandibular joint disorder)    Past Surgical History:  Procedure Laterality Date   ARTHOSCOPIC ROTAOR CUFF REPAIR Right 01/07/2018   Procedure: RIGHT SHOULDER ARTHROSCOPY, SUBACROMIAL DECOMPRESSION, OPEN ROTATOR CUFF REPAIR, OPEN DISTAL CLAVICLE EXCISION, BICEPS TENODESIS, SLAP DEBRIDMENT;  Surgeon: Netta Cedars, MD;  Location: [place];  Service: Orthopedics;  Laterality: Right;   CARPAL TUNNEL RELEASE Right    COLONOSCOPY W/ BIOPSIES AND POLYPECTOMY     IR ANGIO EXTRACRAN SEL COM CAROTID INNOMINATE UNI R MOD SED  05/02/2017   IR GENERIC HISTORICAL  04/24/2016   IR TRANSCATH/EMBOLIZ 04/24/2016 Luanne Bras, MD [facility]   IR GENERIC HISTORICAL  04/24/2016   IR ANGIOGRAM FOLLOW UP STUDY 04/24/2016 Luanne Bras, MD [facility]   IR GENERIC HISTORICAL  04/24/2016   IR NEURO EACH ADD'L AFTER BASIC UNI RIGHT (MS) 04/24/2016 Luanne Bras, MD [facility]   IR GENERIC HISTORICAL  04/24/2016   IR ANGIO INTRA EXTRACRAN SEL INTERNAL CAROTID UNI R MOD SED 04/24/2016 Luanne Bras, MD [facility]   IR GENERIC HISTORICAL  05/19/2016   IR RADIOLOGIST EVAL & MGMT 05/19/2016 [facility]   IR GENERIC HISTORICAL  08/24/2016   IR ANGIO VERTEBRAL SEL SUBCLAVIAN INNOMINATE UNI R MOD SED 08/24/2016 Luanne Bras, MD [facility]   IR GENERIC HISTORICAL  08/24/2016   IR ANGIO INTRA EXTRACRAN SEL COM CAROTID INNOMINATE BILAT MOD SED 08/24/2016 Luanne Bras, MD [facility]   IR RADIOLOGIST EVAL & MGMT  11/29/2017   KNEE ARTHROSCOPY Right    LUMBAR DISC SURGERY     MOHS SURGERY     basal cell face   RADIOLOGY WITH ANESTHESIA N/A 04/24/2016   Procedure: EMBOLIZATION;  Surgeon: Luanne Bras, MD;  Location: [facility] [place];  Service: Radiology;  Laterality: N/A;   RADIOLOGY WITH ANESTHESIA N/A 05/02/2017   Procedure: RADIOLOGY WITH ANESTHESIA     EMBOLIZATION;  Surgeon: Luanne Bras, MD;  Location: [facility];  Service: Radiology;  Laterality: N/A;   TONSILLECTOMY     Social History   Socioeconomic History   Marital status: Married    Spouse name: elizabeth   Number of children: 2   Years of education: [place]   Highest education level: Not on file  Occupational History   Occupation: [employer]  Tobacco Use   Smoking status: Former    Types: Cigarettes    Quit date: 05/01/1987    Years since quitting: 34.9   Smokeless tobacco: Never   Tobacco comments:    quit smoking 30 years ago 02/09/16  Vaping Use   Vaping Use: Never used  Substance and Sexual Activity   Alcohol use: Not Currently    Comment: occasional   Drug use: No   Sexual activity: Not on file  Other Topics Concern   Not on file  Social History Narrative   Lives at home with wife    Caffeine use- coffee, 5 cups daily   Social Determinants of Health   Financial Resource Strain: Not on file  Food Insecurity: Not on file  Transportation Needs: Not on file   Physical Activity: Not on file  Stress: Not on file  Social Connections: Not on file  Intimate Partner Violence: Not on file   Current Outpatient Medications on File Prior to  Visit  Medication Sig Dispense Refill   aspirin 81 MG tablet Take 81 mg by mouth daily.      calcium carbonate (TUMS - DOSED IN MG ELEMENTAL CALCIUM) 500 MG chewable tablet Chew 1 tablet by mouth daily as needed for indigestion or heartburn.     Continuous Blood Gluc Sensor (DEXCOM G6 SENSOR) MISC 1 Device by Does not apply route See admin instructions. Change every 10 days 9 each 3   Continuous Blood Gluc Sensor (FREESTYLE LIBRE 2 SENSOR) MISC 1 Device by Does not apply route every 14 (fourteen) days. 6 each 3   Dulaglutide (TRULICITY) 1.5 OV/5.6EP SOPN Inject 1.5 mg into the skin once a week. 6 mL 3   empagliflozin (JARDIANCE) 25 MG TABS tablet Take 1 tablet (25 mg total) by mouth daily before breakfast. 90 tablet 3   Fremanezumab-vfrm (AJOVY) 225 MG/1.5ML SOAJ Inject 225 mg into the skin every 30 (thirty) days.     hydrochlorothiazide (MICROZIDE) 12.5 MG capsule Take 12.5 mg by mouth daily.     metFORMIN (GLUCOPHAGE-XR) 500 MG 24 hr tablet Take 4 tablets (2,000 mg total) by mouth daily. 360 tablet 3   pyridOXINE (VITAMIN B-6) 100 MG tablet Take 100 mg by mouth daily.     Current Facility-Administered Medications on File Prior to Visit  Medication Dose Route Frequency Provider Last Rate Last Admin   gadopentetate dimeglumine (MAGNEVIST) injection 20 mL  20 mL Intravenous Once PRN Penumalli, Earlean Polka, MD       Allergies  Allergen Reactions   Clopidogrel Other (See Comments)    " bleeding and chest burning" Burning chest and eye started bleeding   Keflex [Cephalexin] Anaphylaxis and Swelling    Throat swelling   Ivp Dye [Iodinated Contrast Media] Hives    Dye used in the 80s    Verapamil Palpitations    AV BLOCK   Glipizide Palpitations and Other (See Comments)    "Makes heart feel funny"    Family History   Problem Relation Age of Onset   Seizures Mother    Schizophrenia Father    Cancer Paternal Grandfather    Diabetes Neg Hx    PE: BP 140/88 (BP Location: Right Arm, Patient Position: Sitting, Cuff Size: Normal)   Pulse 85   Ht 5' 7.5" (1.715 m)   Wt 233 lb 3.2 oz (105.8 kg)   SpO2 98%   BMI 35.99 kg/m  Wt Readings from Last 3 Encounters:  03/24/22 233 lb 3.2 oz (105.8 kg)  12/19/21 234 lb (106.1 kg)  10/13/21 240 lb 3.2 oz (109 kg)   Constitutional: overweight, in NAD Eyes: no exophthalmos ENT: moist mucous membranes, no thyromegaly, no cervical lymphadenopathy Cardiovascular: RRR, No MRG Respiratory: CTA B Musculoskeletal: no deformities Skin: moist, warm, no rashes Neurological: no tremor with outstretched hands  ASSESSMENT: 1. DM2, non-insulin-dependent, uncontrolled, with long-term complications - PN - s/p spinal cord stimulator placement  2. HL  PLAN:  1. Patient with long-standing, uncontrolled diabetes, on oral antidiabetic regimen, with still poor control.  He is currently on metformin, SGLT2 inhibitor, and weekly GLP-1 receptor agonist.  Blood sugars improved on the GLP-1 receptor agonist so he recently had to decrease the dose of Trulicity from 3 to 1.5 mg weekly.  He tolerates this well. -He mentions that he started to change his diet after getting his CGM and after seeing how some of his favorite foods were affecting his blood sugars.  Sugars improved significantly afterwards. CGM interpretation: -At today's visit, we reviewed  his CGM downloads: It appears that 87% of values are in target range (goal >70%), while 13% are higher than 180 (goal <25%), and 0% are lower than 70 (goal <4%).  The calculated average blood sugar is 150.  The projected HbA1c for the next 3 months (GMI) is 6.9%. -Reviewing the CGM trends, it appears that his sugars are fluctuating within a narrow range, with a 16.8% coefficient of variance, which is excellent.  The majority of the blood  sugars are in target range.  He does mention that he was out of town earlier in the month and sugars were higher then. -For now, he appears to do very well on his current regimen, without significant intolerance.  He describes slight nausea with Trulicity approximately 2 days after the injection.  We discussed that he can take Mylanta.  Otherwise, we can continue with the same regimen. - I suggested to:  Patient Instructions  Please continue: - Metformin ER 2000 mg daily - Jardiance 25 mg daily - Trulicity 1.5 mg weekly   Please return in 4 months.  - check sugars at different times of the day - check 4x a day, rotating checks - CBG targets for treatment: 80-130 mg/dL before meals and <180 mg/dL after meals; target HbA1c <7%. - given sugar log and advised how to fill it and to bring it at next appt  - given foot care handout  - given instructions for hypoglycemia management "15-15 rule"  - advised for yearly eye exams  - Return to clinic in 3 mo with sugar log   2. HL - Reviewed latest lipid panel from 02/2021: Triglycerides high, LDL at goal, less than 70, HDL normal -He is not on a statin; he mentions that his LDL cholesterol was always at target -we did discuss that statins are usually recommended in the setting of diabetes, with guidelines varying between target in a certain number or a percentage decrease in LDL -He is due for another lipid panel -he has an appointment coming up with PCP next month  - Total time spent for the visit: 40 min, in precharting, reviewing Dr. Cordelia Pen last note, obtaining medical information from the chart and from the pt, reviewing his  previous labs, evaluations, and treatments, reviewing his symptoms, counseling him about his endocrine conditions (please see the discussed topics above), and developing a plan to further treat them.  Philemon Kingdom, MD PhD [facility] Endocrinology

## 2022-03-24 NOTE — Patient Instructions (Addendum)
Please continue: - Metformin ER 2000 mg daily - Jardiance 25 mg daily - Trulicity 1.5 mg weekly   Please return in 4 months.  PATIENT INSTRUCTIONS FOR TYPE 2 DIABETES:  DIET AND EXERCISE Diet and exercise is an important part of diabetic treatment.  We recommended aerobic exercise in the form of brisk walking (working between 40-60% of maximal aerobic capacity, similar to brisk walking) for 150 minutes per week (such as 30 minutes five days per week) along with 3 times per week performing 'resistance' training (using various gauge rubber tubes with handles) 5-10 exercises involving the major muscle groups (upper body, lower body and core) performing 10-15 repetitions (or near fatigue) each exercise. Start at half the above goal but build slowly to reach the above goals. If limited by weight, joint pain, or disability, we recommend daily walking in a swimming pool with water up to waist to reduce pressure from joints while allow for adequate exercise.    BLOOD GLUCOSES Monitoring your blood glucoses is important for continued management of your diabetes. Please check your blood glucoses 2-4 times a day: fasting, before meals and at bedtime (you can rotate these measurements - e.g. one day check before the 3 meals, the next day check before 2 of the meals and before bedtime, etc.).   HYPOGLYCEMIA (low blood sugar) Hypoglycemia is usually a reaction to not eating, exercising, or taking too much insulin/ other diabetes drugs.  Symptoms include tremors, sweating, hunger, confusion, headache, etc. Treat IMMEDIATELY with 15 grams of Carbs: 4 glucose tablets  cup regular juice/soda 2 tablespoons raisins 4 teaspoons sugar 1 tablespoon honey Recheck blood glucose in 15 mins and repeat above if still symptomatic/blood glucose <100.  RECOMMENDATIONS TO REDUCE YOUR RISK OF DIABETIC COMPLICATIONS: * Take your prescribed MEDICATION(S) * Follow a DIABETIC diet: Complex carbs, fiber rich foods,  (monounsaturated and polyunsaturated) fats * AVOID saturated/trans fats, high fat foods, >2,300 mg salt per day. * EXERCISE at least 5 times a week for 30 minutes or preferably daily.  * DO NOT SMOKE OR DRINK more than 1 drink a day. * Check your FEET every day. Do not wear tightfitting shoes. Contact us if you develop an ulcer * See your EYE doctor once a year or more if needed * Get a FLU shot once a year * Get a PNEUMONIA vaccine once before and once after age 34 years  GOALS:  * Your Hemoglobin A1c of <7%  * fasting sugars need to be <130 * after meals sugars need to be <180 (2h after you start eating) * Your Systolic BP should be 924 or lower  * Your Diastolic BP should be 80 or lower  * Your HDL (Good Cholesterol) should be 40 or higher  * Your LDL (Bad Cholesterol) should be 100 or lower. * Your Triglycerides should be 150 or lower  * Your Urine microalbumin (kidney function) should be <30 * Your Body Mass Index should be 25 or lower    Please consider the following ways to cut down carbs and fat and increase fiber and micronutrients in your diet: - substitute whole grain for white bread or pasta - substitute brown rice for white rice - substitute 90-calorie flat bread pieces for slices of bread when possible - substitute sweet potatoes or yams for white potatoes - substitute humus for margarine - substitute tofu for cheese when possible - substitute almond or rice milk for regular milk (would not drink soy milk daily due to concern for soy  estrogen influence on breast cancer risk) - substitute dark chocolate for other sweets when possible - substitute water - can add lemon or orange slices for taste - for diet sodas (artificial sweeteners will trick your body that you can eat sweets without getting calories and will lead you to overeating and weight gain in the long run) - do not skip breakfast or other meals (this will slow down the metabolism and will result in more weight  gain over time)  - can try smoothies made from fruit and almond/rice milk in am instead of regular breakfast - can also try old-fashioned (not instant) oatmeal made with almond/rice milk in am - order the dressing on the side when eating salad at a restaurant (pour less than half of the dressing on the salad) - eat as little meat as possible - can try juicing, but should not forget that juicing will get rid of the fiber, so would alternate with eating raw veg./fruits or drinking smoothies - use as little oil as possible, even when using olive oil - can dress a salad with a mix of balsamic vinegar and lemon juice, for e.g. - use agave nectar, stevia sugar, or regular sugar rather than artificial sweateners - steam or broil/roast veggies  - snack on veggies/fruit/nuts (unsalted, preferably) when possible, rather than processed foods - reduce or eliminate aspartame in diet (it is in diet sodas, chewing gum, etc) Read the labels!  Try to read Dr. Janene Harvey book: "Program for Reversing Diabetes" for other ideas for healthy eating.

## 2022-06-05 ENCOUNTER — Other Ambulatory Visit: Payer: Self-pay

## 2022-06-05 DIAGNOSIS — E1142 Type 2 diabetes mellitus with diabetic polyneuropathy: Secondary | ICD-10-CM

## 2022-06-05 MED ORDER — METFORMIN HCL ER 500 MG PO TB24: 2000.0000 mg | ORAL_TABLET | Freq: Every day | ORAL | 3 refills | Status: DC

## 2022-07-25 ENCOUNTER — Ambulatory Visit (INDEPENDENT_AMBULATORY_CARE_PROVIDER_SITE_OTHER): Payer: No Typology Code available for payment source | Admitting: Internal Medicine

## 2022-07-25 ENCOUNTER — Encounter: Payer: Self-pay | Admitting: Internal Medicine

## 2022-07-25 VITALS — BP 120/80 | HR 74 | Ht 67.5 in | Wt 236.2 lb

## 2022-07-25 DIAGNOSIS — E1142 Type 2 diabetes mellitus with diabetic polyneuropathy: Secondary | ICD-10-CM

## 2022-07-25 DIAGNOSIS — E785 Hyperlipidemia, unspecified: Secondary | ICD-10-CM | POA: Diagnosis not present

## 2022-07-25 DIAGNOSIS — E1165 Type 2 diabetes mellitus with hyperglycemia: Secondary | ICD-10-CM | POA: Diagnosis not present

## 2022-07-25 LAB — POCT GLYCOSYLATED HEMOGLOBIN (HGB A1C): Hemoglobin A1C: 7.7 % — AB (ref 4.0–5.6)

## 2022-07-25 MED ORDER — DEXCOM G7 SENSOR MISC: 3 refills | Status: DC

## 2022-07-25 MED ORDER — TRULICITY 3 MG/0.5ML ~~LOC~~ SOAJ: 3.0000 mg | SUBCUTANEOUS | 3 refills | Status: DC

## 2022-07-25 MED ORDER — EMPAGLIFLOZIN 25 MG PO TABS: 25.0000 mg | ORAL_TABLET | Freq: Every day | ORAL | 3 refills | Status: DC

## 2022-07-25 MED ORDER — DEXCOM G6 TRANSMITTER MISC: 3 refills | Status: DC

## 2022-07-25 NOTE — Progress Notes (Signed)
Patient ID: Nicholas Wheeler, male   DOB: September 09, 1960, 61 y.o.   MRN: [id number]  HPI: Nicholas Wheeler is a 61 y.o.-year-old male, returning for follow-up for DM2, dx in 2011, non-insulin-dependent, uncontrolled, with complications (peripheral neuropathy). Pt. previously saw Dr. Loanne Drilling, but last visit with me 4 months ago.  Interim history: He has no complaints today. He just returned from vacation in [place] he was for 2 weeks.  He relaxed his diet over there and sugars have been higher.  Reviewed HbA1c: Lab Results  Component Value Date   HGBA1C 7.0 (A) 03/24/2022   HGBA1C 7.0 (A) 12/19/2021   HGBA1C 7.9 (A) 10/13/2021   HGBA1C 8.1 (A) 08/11/2021   HGBA1C 9.1 (A) 06/09/2021   HGBA1C 6.9 (H) 04/30/2017   HGBA1C 7.9 (H) 04/19/2016   HGBA1C 6.8 (H) 02/09/2016  02/25/2021: HbA1c 8.3%  Pt is on a regimen of: - Metformin ER 2000 mg daily >> 1000-500-500 mg  - Jardiance 25 mg daily - Trulicity 3 >> 1.5 mg weekly (dose prev. decreased because of lows) He was on glipizide in the past but this caused palpitations.  Pt checks his sugars >4x a day with his CGM:   Previously:   Lowest sugar was 60 >> 80s; he has hypoglycemia awareness at 100.  Highest sugar was 300 (pancakes) >> 280  Glucometer: Accu-Chek  - no CKD, last BUN/creatinine:  04/28/2022: 16/1.01, GFR 84, Glu 177  2021-02-25     Albumin 4.3   3.4-4.8  ALP 50   38-126  ALT 30   0-52  Anion Gap 12.2   6.0-20.0  AST 21   0-39  BUN 12   6-26  CO2 32   22-32  CA-corrected 9.40   8.60-10.30  Calcium 9.7   8.6-10.3  Chloride 99   98-107  Creatinine 0.87   0.60-1.30  eGFR2021 98   >60  Glucose 132   70-99  Potassium 4.2   3.5-5.5  Sodium 139   136-145  Protein, Total 7.1   6.0-8.3  TBIL 0.5   0.3-1.0   Microalbumin Panel   2021-02-25    MA/CR ratio <10.0   0.0-30.0  UCR 70      UMA <0.7       Lab Results  Component Value Date   BUN 12 12/30/2018   BUN 9 01/07/2018   CREATININE 1.26 (H) 12/30/2018    CREATININE 0.93 01/07/2018   -+ Mixed HL; last set of lipids: 04/28/2022: 130/321/96/47 Lipid Panel w/reflex   2021-02-25    LDL Chol Calc (NIH) 69   0-99  CHOL/HDL 4.0   2.0-4.0  Cholesterol 162   <200  HDLD 41   30-70  LDL Chol Calc (NIH) 69   0-99  NHDL 121   0-129  Triglyceride 329   0-199   No results found for: "CHOL", "HDL", "LDLCALC", "LDLDIRECT", "TRIG", "CHOLHDL"  - last eye exam was: 2022: NO DR reportedly, prev.: EYE EXAM   2020-03-30    Y/N Retinopathy N      - + numbness and tingling in his feet.  Last foot exam 06/09/2021.  He has a spinal cord stimulator.  He also has a history of OSA, obesity class III, hepatic steatosis, right MCA aneurysm, migraine, GERD.  04/28/2022: TSH 2.25  ROS: + see HPI No increased urination, blurry vision, nausea, chest pain.  Past Medical History:  Diagnosis Date   Aneurysm ([place])    Right MCA.    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   Diabetes ([place])    Type II   Diabetic neuropathy ([facility])    GERD (gastroesophageal reflux disease)    History of hiatal hernia    Hypersomnia with sleep apnea, unspecified 09/08/2013   Joint disorder    degenerated   Migraine    Obesity (BMI 35.0-39.9 without comorbidity)    Recurrent sinus infections    Skin cancer    Sleep apnea    saw Dr Brett Fairy in 09/2013; wears CPAP set at 14   TMJ (temporomandibular joint disorder)    Past Surgical History:  Procedure Laterality Date   ARTHOSCOPIC ROTAOR CUFF REPAIR Right 01/07/2018   Procedure: RIGHT SHOULDER ARTHROSCOPY, SUBACROMIAL DECOMPRESSION, OPEN ROTATOR CUFF REPAIR, OPEN DISTAL CLAVICLE EXCISION, BICEPS TENODESIS, SLAP DEBRIDMENT;  Surgeon: Netta Cedars, MD;  Location: [facility];  Service: Orthopedics;  Laterality: Right;   CARPAL TUNNEL RELEASE Right    COLONOSCOPY W/ BIOPSIES AND POLYPECTOMY     IR ANGIO EXTRACRAN  SEL COM CAROTID INNOMINATE UNI R MOD SED  05/02/2017   IR GENERIC HISTORICAL  04/24/2016   IR TRANSCATH/EMBOLIZ 04/24/2016 Luanne Bras, MD [facility]   IR GENERIC HISTORICAL  04/24/2016   IR ANGIOGRAM FOLLOW UP STUDY 04/24/2016 Luanne Bras, MD [facility]   IR GENERIC HISTORICAL  04/24/2016   IR NEURO EACH ADD'L AFTER BASIC UNI RIGHT (MS) 04/24/2016 Luanne Bras, MD [facility]   IR GENERIC HISTORICAL  04/24/2016   IR ANGIO INTRA EXTRACRAN SEL INTERNAL CAROTID UNI R MOD SED 04/24/2016 Luanne Bras, MD [facility]   IR GENERIC HISTORICAL  05/19/2016   IR RADIOLOGIST EVAL & MGMT 05/19/2016 [facility]   IR GENERIC HISTORICAL  08/24/2016   IR ANGIO VERTEBRAL SEL SUBCLAVIAN INNOMINATE UNI R MOD SED 08/24/2016 Luanne Bras, MD [facility]   IR GENERIC HISTORICAL  08/24/2016   IR ANGIO INTRA EXTRACRAN SEL COM CAROTID INNOMINATE BILAT MOD SED 08/24/2016 Luanne Bras, MD [facility]   IR RADIOLOGIST EVAL & MGMT  11/29/2017   KNEE ARTHROSCOPY Right    LUMBAR DISC SURGERY     MOHS SURGERY     basal cell face   RADIOLOGY WITH ANESTHESIA N/A 04/24/2016   Procedure: EMBOLIZATION;  Surgeon: Luanne Bras, MD;  Location: [facility];  Service: Radiology;  Laterality: N/A;   RADIOLOGY WITH ANESTHESIA N/A 05/02/2017   Procedure: RADIOLOGY WITH ANESTHESIA     EMBOLIZATION;  Surgeon: Luanne Bras, MD;  Location: [facility];  Service: Radiology;  Laterality: N/A;   TONSILLECTOMY     Social History   Socioeconomic History   Marital status: Married    Spouse name: elizabeth   Number of children: 2   Years of education: Bach   Highest education level: Not on file  Occupational History   Occupation: [employer]  Tobacco Use   Smoking status: Former    Types: Cigarettes    Quit date: 05/01/1987    Years since quitting: 35.2   Smokeless tobacco: Never   Tobacco comments:    quit smoking 30 years ago 02/09/16  Vaping Use   Vaping Use: Never used  Substance and Sexual Activity    Alcohol use: Not Currently    Comment: occasional   Drug use: No   Sexual activity: Not on file  Other Topics Concern   Not on file  Social History Narrative   Lives at home with  wife    Caffeine use- coffee, 5 cups daily   Social Determinants of Health   Financial Resource Strain: Not on file  Food Insecurity: Not on file  Transportation Needs: Not on file  Physical Activity: Not on file  Stress: Not on file  Social Connections: Not on file  Intimate Partner Violence: Not on file   Current Outpatient Medications on File Prior to Visit  Medication Sig Dispense Refill   aspirin 81 MG tablet Take 81 mg by mouth daily.      calcium carbonate (TUMS - DOSED IN MG ELEMENTAL CALCIUM) 500 MG chewable tablet Chew 1 tablet by mouth daily as needed for indigestion or heartburn.     Continuous Blood Gluc Sensor (DEXCOM G6 SENSOR) MISC 1 Device by Does not apply route See admin instructions. Change every 10 days 9 each 3   Dulaglutide (TRULICITY) 1.5 HD/6.2IW SOPN Inject 1.5 mg into the skin once a week. 6 mL 3   empagliflozin (JARDIANCE) 25 MG TABS tablet Take 1 tablet (25 mg total) by mouth daily before breakfast. 90 tablet 3   Fremanezumab-vfrm (AJOVY) 225 MG/1.5ML SOAJ Inject 225 mg into the skin every 30 (thirty) days.     hydrochlorothiazide (MICROZIDE) 12.5 MG capsule Take 12.5 mg by mouth daily.     metFORMIN (GLUCOPHAGE-XR) 500 MG 24 hr tablet Take 4 tablets (2,000 mg total) by mouth daily. 360 tablet 3   pyridOXINE (VITAMIN B-6) 100 MG tablet Take 100 mg by mouth daily.     Current Facility-Administered Medications on File Prior to Visit  Medication Dose Route Frequency Provider Last Rate Last Admin   gadopentetate dimeglumine (MAGNEVIST) injection 20 mL  20 mL Intravenous Once PRN Penumalli, Earlean Polka, MD       Allergies  Allergen Reactions   Clopidogrel Other (See Comments)    " bleeding and chest burning" Burning chest and eye started bleeding   Keflex [Cephalexin]  Anaphylaxis and Swelling    Throat swelling   Ivp Dye [Iodinated Contrast Media] Hives    Dye used in the 80s    Verapamil Palpitations    AV BLOCK   Glipizide Palpitations and Other (See Comments)    "Makes heart feel funny"    Family History  Problem Relation Age of Onset   Seizures Mother    Schizophrenia Father    Cancer Paternal Grandfather    Diabetes Neg Hx    PE: BP 120/80 (BP Location: Right Arm, Patient Position: Sitting, Cuff Size: Normal)   Pulse 74   Ht 5' 7.5" (1.715 m)   Wt 236 lb 3.2 oz (107.1 kg)   SpO2 96%   BMI 36.45 kg/m  Wt Readings from Last 3 Encounters:  07/25/22 236 lb 3.2 oz (107.1 kg)  03/24/22 233 lb 3.2 oz (105.8 kg)  12/19/21 234 lb (106.1 kg)   Constitutional: overweight, in NAD Eyes: no exophthalmos ENT: no thyromegaly, no cervical lymphadenopathy Cardiovascular: RRR, No MRG Respiratory: CTA B Musculoskeletal: no deformities Skin: no rashes Neurological: no tremor with outstretched hands  ASSESSMENT: 1. DM2, non-insulin-dependent, uncontrolled, with long-term complications - PN - s/p spinal cord stimulator placement  2. HL  PLAN:  1. Patient with longstanding, uncontrolled, type 2 diabetes, on oral antidiabetic regimen with metformin and SGLT2 inhibitor, and also weekly GLP-1 receptor agonist, with improved control in the last year.  After starting on the CGM, he decided to change his diet after seeing how his favorite foods were affecting his blood sugars.  Sugars improved significantly  afterwards.  At last visit, reviewing the CGM trends, they were fluctuating within a narrow range, with a 16.8 coefficient of variance, which was excellent.  He did have slight nausea with Trulicity for approximately 2 days after the injection and we discussed about potentially using Mylanta for this.  He tried a higher dose of Trulicity in the past but could not tolerate it.  We did not change the regimen. CGM interpretation: -At today's visit, we  reviewed his CGM downloads: It appears that 41% of values are in target range (goal >70%), while 59% are higher than 180 (goal <25%), and 0% are lower than 70 (goal <4%).  The calculated average blood sugar is 193.  The projected HbA1c for the next 3 months (GMI) is 7.9%. -Reviewing the CGM trends, sugars appear to be higher in the last 2 weeks compared to the previous 2 weeks, most likely due to his vacation.  However, he did notice that sugars were slightly higher than target even before going in vacation.  For now, he will definitely start working on his diet but I also suggested to try to increase the Trulicity dose.  He agrees with the plan.  He still has the 3 mg Trulicity at home so he does not need a refill today.  We will continue metformin and Jardiance.  I refilled his Jardiance today. - I suggested to:  Patient Instructions  Please continue: - Metformin ER 2000 mg daily - Jardiance 25 mg daily  Please increase: - Trulicity 3 mg weekly   Please return in 4 months.  - we checked his HbA1c: 7.7% (higher) - advised to check sugars at different times of the day - 4x a day, rotating check times - advised for yearly eye exams >> he is UTD - return to clinic in 4 months  2. HL - Reviewed latest lipid panel from 04/2022 per records brought by patient: LDL at goal, however, triglycerides elevated -Increasing his Trulicity and improving diet will likely help the triglyceride levels -At last visit he was not on a statin.  He mentions that his LDL cholesterol was always at goal so he did not have to start one.  However, we did discuss that statins are usually recommended in the setting of diabetes, with the guidelines vary between recommending a certain target for certain percentage decrease in LDL.   Philemon Kingdom, MD PhD [facility]

## 2022-07-25 NOTE — Patient Instructions (Addendum)
Please continue: - Metformin ER 2000 mg daily - Jardiance 25 mg daily  Please increase: - Trulicity 3 mg weekly   Please return in 4 months.

## 2022-08-12 NOTE — Progress Notes (Unsigned)
Cardiology Clinic Note   Patient Name: Nicholas Wheeler Date of Encounter: 08/12/2022  Primary Care Provider:  Antony Contras, MD Primary Cardiologist:  None  Patient Profile    [name] is a 61 y.o. male with a past medical history of palpitations, T2DM, GERD, OSA on CPAP who presents to the clinic today for complaints of palpitations.   Past Medical History    Past Medical History:  Diagnosis Date   Aneurysm ([place])    Right MCA.    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   Diabetes ([place])    Type II   Diabetic neuropathy ([facility])    GERD (gastroesophageal reflux disease)    History of hiatal hernia    Hypersomnia with sleep apnea, unspecified 09/08/2013   Joint disorder    degenerated   Migraine    Obesity (BMI 35.0-39.9 without comorbidity)    Recurrent sinus infections    Skin cancer    Sleep apnea    saw Dr Brett Fairy in 09/2013; wears CPAP set at 14   TMJ (temporomandibular joint disorder)    Past Surgical History:  Procedure Laterality Date   ARTHOSCOPIC ROTAOR CUFF REPAIR Right 01/07/2018   Procedure: RIGHT SHOULDER ARTHROSCOPY, SUBACROMIAL DECOMPRESSION, OPEN ROTATOR CUFF REPAIR, OPEN DISTAL CLAVICLE EXCISION, BICEPS TENODESIS, SLAP DEBRIDMENT;  Surgeon: Netta Cedars, MD;  Location: [facility];  Service: Orthopedics;  Laterality: Right;   CARPAL TUNNEL RELEASE Right    COLONOSCOPY W/ BIOPSIES AND POLYPECTOMY     IR ANGIO EXTRACRAN SEL COM CAROTID INNOMINATE UNI R MOD SED  05/02/2017   IR GENERIC HISTORICAL  04/24/2016   IR TRANSCATH/EMBOLIZ 04/24/2016 Luanne Bras, MD [facility]   IR GENERIC HISTORICAL  04/24/2016   IR ANGIOGRAM FOLLOW UP STUDY 04/24/2016 Luanne Bras, MD [facility]   IR GENERIC HISTORICAL  04/24/2016   IR NEURO EACH ADD'L AFTER BASIC UNI RIGHT (MS) 04/24/2016 Luanne Bras, MD [facility]   IR GENERIC  HISTORICAL  04/24/2016   IR ANGIO INTRA EXTRACRAN SEL INTERNAL CAROTID UNI R MOD SED 04/24/2016 Luanne Bras, MD [facility]   IR GENERIC HISTORICAL  05/19/2016   IR RADIOLOGIST EVAL & MGMT 05/19/2016 [facility]   IR GENERIC HISTORICAL  08/24/2016   IR ANGIO VERTEBRAL SEL SUBCLAVIAN INNOMINATE UNI R MOD SED 08/24/2016 Luanne Bras, MD [facility]   IR GENERIC HISTORICAL  08/24/2016   IR ANGIO INTRA EXTRACRAN SEL COM CAROTID INNOMINATE BILAT MOD SED 08/24/2016 Luanne Bras, MD [facility]   IR RADIOLOGIST EVAL & MGMT  11/29/2017   KNEE ARTHROSCOPY Right    LUMBAR DISC SURGERY     MOHS SURGERY     basal cell face   RADIOLOGY WITH ANESTHESIA N/A 04/24/2016   Procedure: EMBOLIZATION;  Surgeon: Luanne Bras, MD;  Location: [facility];  Service: Radiology;  Laterality: N/A;   RADIOLOGY WITH ANESTHESIA N/A 05/02/2017   Procedure: RADIOLOGY WITH ANESTHESIA     EMBOLIZATION;  Surgeon: Luanne Bras, MD;  Location: [facility];  Service: Radiology;  Laterality: N/A;   TONSILLECTOMY      Allergies  Allergies  Allergen Reactions   Clopidogrel Other (See Comments)    " bleeding and chest burning" Burning chest and eye started bleeding   Keflex [Cephalexin] Anaphylaxis and Swelling    Throat swelling   Ivp Dye [Iodinated Contrast Media] Hives  Dye used in the 80s    Verapamil Palpitations    AV BLOCK   Glipizide Palpitations and Other (See Comments)    "Makes heart feel funny"     History of Present Illness    Nicholas Wheeler has a past medical history of: Palpitations.  T2DM.  GERD. OSA. CPAP ***% adherence.   Nicholas Wheeler was seen as a new patient by Dr. Percival Spanish on 03/17/2020 at the request of his PCP for palpitations. Palpitations were suspected to be PVCs and PACs. His coronary calcium score on 04/02/2020 was 6.2 (36 percentile). Patient did not return for follow-up.   Today, patient ***  Palpitations. Patient reports *** EKG ***    Home Medications     No outpatient medications have been marked as taking for the 08/15/22 encounter (Appointment) with Deberah Pelton, NP.    Family History    Family History  Problem Relation Age of Onset   Seizures Mother    Schizophrenia Father    Cancer Paternal Grandfather    Diabetes Neg Hx    He indicated that his mother is alive. He indicated that his father is alive. He indicated that his sister is alive. He indicated that his brother is alive. He indicated that his paternal grandfather is deceased. He indicated that the status of his neg hx is unknown.   Social History    Social History   Socioeconomic History   Marital status: Married    Spouse name: elizabeth   Number of children: 2   Years of education: [employer]   Highest education level: Not on file  Occupational History   Occupation: [employer]  Tobacco Use   Smoking status: Former    Types: Cigarettes    Quit date: 05/01/1987    Years since quitting: 35.3   Smokeless tobacco: Never   Tobacco comments:    quit smoking 30 years ago 02/09/16  Vaping Use   Vaping Use: Never used  Substance and Sexual Activity   Alcohol use: Not Currently    Comment: occasional   Drug use: No   Sexual activity: Not on file  Other Topics Concern   Not on file  Social History Narrative   Lives at home with wife    Caffeine use- coffee, 5 cups daily   Social Determinants of Health   Financial Resource Strain: Not on file  Food Insecurity: Not on file  Transportation Needs: Not on file  Physical Activity: Not on file  Stress: Not on file  Social Connections: Not on file  Intimate Partner Violence: Not on file     Review of Systems    General: *** No chills, fever, night sweats or weight changes.  Cardiovascular:  No chest pain, dyspnea on exertion, edema, orthopnea, palpitations, paroxysmal nocturnal dyspnea. Dermatological: No rash, lesions/masses Respiratory: No cough, dyspnea Urologic: No hematuria, dysuria Abdominal:   No nausea,  vomiting, diarrhea, bright red blood per rectum, melena, or hematemesis Neurologic:  No visual changes, weakness, changes in mental status. All other systems reviewed and are otherwise negative except as noted above.  Physical Exam    VS:  There were no vitals taken for this visit. , BMI There is no height or weight on file to calculate BMI. GEN: *** Well nourished, well developed, in no acute distress. HEENT: Normal. Neck: Supple, no JVD, carotid bruits, or masses. Cardiac: RRR, no murmurs, rubs, or gallops. No clubbing, cyanosis, edema.  Radials/DP/PT 2+ and equal bilaterally.  Respiratory:  Respirations regular and  unlabored, clear to auscultation bilaterally. GI: Soft, nontender, nondistended. MS: No deformity or atrophy. Skin: Warm and dry, no rash. Neuro: Strength and sensation are intact. Psych: Normal affect.  Accessory Clinical Findings    The following studies were reviewed for this visit: ***  Recent Labs: No results found for requested labs within last 365 days.   Recent Lipid Panel No results found for: "CHOL", "TRIG", "HDL", "CHOLHDL", "VLDL", "LDLCALC", "LDLDIRECT"  No BP recorded.  {Refresh Note OR Click here to enter BP  :1}***    ECG personally reviewed by me today: ***  No significant changes from ***  {Does this patient have ATRIAL FIBRILLATION?[phone]}   Assessment & Plan   ***      Disposition: ***   Justice Britain. Amberley Hamler, NP-C     08/12/2022, 2:03 PM [address] Office [phone] Fax [phone]   I spent *** minutes examining this patient, reviewing medications, and using patient centered shared decision making involving her cardiac care.  Prior to her visit I spent greater than 20 minutes reviewing her past medical history,  medications, and prior cardiac tests.

## 2022-08-15 ENCOUNTER — Ambulatory Visit: Payer: No Typology Code available for payment source | Attending: General Practice | Admitting: Student

## 2022-08-15 ENCOUNTER — Encounter: Payer: Self-pay | Admitting: General Practice

## 2022-08-15 VITALS — BP 118/68 | HR 78 | Ht 65.5 in | Wt 233.2 lb

## 2022-08-15 DIAGNOSIS — R002 Palpitations: Secondary | ICD-10-CM | POA: Diagnosis not present

## 2022-08-15 MED ORDER — METOPROLOL TARTRATE 25 MG PO TABS: 25.0000 mg | ORAL_TABLET | Freq: Two times a day (BID) | ORAL | 3 refills | Status: DC

## 2022-08-15 NOTE — Patient Instructions (Signed)
Medication Instructions:  METOPROLOL TARTRATE '25MG'$  TWICE DAILY AS NEEDED FOR SUSTAINED PALPITATIONS  *If you need a refill on your cardiac medications before your next appointment, please call your pharmacy*  Lab Work: NONE If you have labs (blood work) drawn today and your tests are completely normal, you will receive your results only by:  [place] (if you have MyChart) OR  A paper copy in the mail If you have any lab test that is abnormal or we need to change your treatment, we will call you to review the results.  Testing/Procedures: NONE  Follow-Up: At [facility], you and your health needs are our priority.  As part of our continuing mission to provide you with exceptional heart care, we have created designated Provider Care Teams.  These Care Teams include your primary Cardiologist (physician) and Advanced Practice Providers (APPs -  Physician Assistants and Nurse Practitioners) who all work together to provide you with the care you need, when you need it.  Your next appointment:   12 month(s)  The format for your next appointment:   In Person  Provider:   Minus Breeding, MD    Other Instructions  Important Information About Sugar

## 2022-08-22 ENCOUNTER — Other Ambulatory Visit: Payer: Self-pay | Admitting: Student

## 2022-10-26 ENCOUNTER — Other Ambulatory Visit: Payer: Self-pay

## 2022-10-26 MED ORDER — TRULICITY 3 MG/0.5ML ~~LOC~~ SOAJ: 3.0000 mg | SUBCUTANEOUS | 3 refills | Status: DC

## 2022-11-23 ENCOUNTER — Ambulatory Visit: Payer: No Typology Code available for payment source | Admitting: Internal Medicine

## 2022-11-23 ENCOUNTER — Encounter: Payer: Self-pay | Admitting: Internal Medicine

## 2022-11-23 VITALS — BP 126/80 | HR 82 | Ht 65.5 in | Wt 227.6 lb

## 2022-11-23 DIAGNOSIS — E785 Hyperlipidemia, unspecified: Secondary | ICD-10-CM | POA: Diagnosis not present

## 2022-11-23 DIAGNOSIS — E1142 Type 2 diabetes mellitus with diabetic polyneuropathy: Secondary | ICD-10-CM

## 2022-11-23 LAB — POCT GLYCOSYLATED HEMOGLOBIN (HGB A1C): Hemoglobin A1C: 7.2 % — AB (ref 4.0–5.6)

## 2022-11-23 NOTE — Patient Instructions (Addendum)
Please start Fish oil 1000 mg 2x a day.  Please continue: - Metformin ER 2000 mg daily - Jardiance 25 mg daily - Trulicity 3 mg weekly   Please return in 4 months.

## 2022-11-23 NOTE — Progress Notes (Signed)
Patient ID: Nicholas Wheeler, male   DOB: [birth year], 62 y.o.   MRN: [id number]  HPI: Nicholas Wheeler is a 62 y.o.-year-old pleasant male, returning for follow-up for DM2, dx in 2011, non-insulin-dependent, uncontrolled, with complications (peripheral neuropathy). Pt. previously saw Dr. Loanne Drilling, but last visit with me 4 months ago.  Interim history: He has no complaints today. No increased urination, blurry vision, nausea, chest pain. He lost 9 lbs since last OV. At last visit, he just returned from vacation in [place] he was for 2 weeks.  He relaxed his diet over there and sugars were higher.  They started to improve afterwards.  Reviewed HbA1c: Lab Results  Component Value Date   HGBA1C 7.7 (A) 07/25/2022   HGBA1C 7.0 (A) 03/24/2022   HGBA1C 7.0 (A) 12/19/2021   HGBA1C 7.9 (A) 10/13/2021   HGBA1C 8.1 (A) 08/11/2021   HGBA1C 9.1 (A) 06/09/2021   HGBA1C 6.9 (H) 04/30/2017   HGBA1C 7.9 (H) 04/19/2016   HGBA1C 6.8 (H) 02/09/2016  02/25/2021: HbA1c 8.3%  Pt is on a regimen of: - Metformin ER 2000 mg daily >> 1000-500-500 mg  - Jardiance 25 mg daily - Trulicity 3 >> 1.5 mg weekly (dose prev. decreased because of lows) >> 3 mg weekly He was on glipizide in the past but this caused palpitations.  Pt checks his sugars >4x a day with his CGM:  Previously:   Previously:   Lowest sugar was 60 >> 80s >> 75; he has hypoglycemia awareness at 100.  Highest sugar was 300 (pancakes) >> 280 >> 250.  Glucometer: Accu-Chek  - no CKD, last BUN/creatinine:  Comp Metabolic Panel Reviewed [id number] 03:43:21 PM Interpretation: Performing Lab: Notes/Report: Testing Performed at: [facility], [address], [address], [place], [place] [address]  Glucose 180 70-99 mg/dL    BUN 15 6-26 mg/dL    Creatinine 1.00 0.60-1.30 mg/dl    [id number] >60 calc In accordance with recommendations from NKF-ASN Task Force, [facility] has updated its eGFR calc to the 2021 CKD-EDI equation that  estimates kidney function without a race variable;Stage 1 > 90 ML/Min plus Albuminuria;Stage 2 60-89 ML/MIN;Stage 3 30-59 ML/MIN;Stage 4 15-29 ML/MIN;Stage 5 <15 ML/MIN  Sodium 138 136-145 mmol/L    Potassium 3.9 3.5-5.5 mmol/L    Chloride 100 98-107 mmol/L    CO2 30 22-32 mmol/L    Anion Gap 11.9 6.0-20.0 mmol/L    Calcium 9.8 8.6-10.3 mg/dL    CA-corrected 9.17 8.60-10.30 mg/dL    Protein, Total 7.1 6.0-8.3 g/dL    Albumin 4.6 3.4-4.8 g/dL    TBIL 0.4 0.3-1.0 mg/dL    ALP 69 38-126 U/L    AST 23 0-39 U/L    ALT 29 0-52 U/L     Lab Results  Component Value Date   BUN 12 12/30/2018   BUN 9 01/07/2018   CREATININE 1.26 (H) 12/30/2018   CREATININE 0.93 01/07/2018   -+ Mixed HL; last set of lipids: Lipid Panel w/reflex Reviewed date:10/30/2022 03:43:03 PM Interpretation: Performing Lab: Notes/Report: Testing Performed at: [facility], [address], [address], [place], [place] [address]  Cholesterol 127 <200 mg/dL    CHOL/HDL 3.4 2.0-4.0 Ratio    HDLD 37 30-70 mg/dL Values below 40 mg/dL indicate increased risk factor  Triglyceride 440 0-199 mg/dL    NHDL 90 0-129 mg/dL Range dependent upon risk factors.  LDL Chol Calc (NIH) 29 0-99 mg/dL    04/28/2022: 130/321/96/47 02/25/2021: 162/329/41/69 No results found for: "CHOL", "HDL", "LDLCALC", "LDLDIRECT", "TRIG", "CHOLHDL"  -  last eye exam was: 2023. NO DR reportedly. [facility] Dr. Aletha Halim go back to [facility].  - + numbness and tingling in his feet.  Last foot exam 06/09/2021.  He has a spinal cord stimulator.  He also has a history of OSA, obesity class III, hepatic steatosis, right MCA aneurysm, migraine, GERD.  04/28/2022: TSH 2.25  ROS: + see HPI  Past Medical History:  Diagnosis Date   Aneurysm ([place])    Right MCA.    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   Diabetes ([place])    Type II    Diabetic neuropathy ([facility])    GERD (gastroesophageal reflux disease)    History of hiatal hernia    Hypersomnia with sleep apnea, unspecified 09/08/2013   Joint disorder    degenerated   Migraine    Obesity (BMI 35.0-39.9 without comorbidity)    Recurrent sinus infections    Skin cancer    Sleep apnea    saw Dr Brett Fairy in 09/2013; wears CPAP set at 14   TMJ (temporomandibular joint disorder)    Past Surgical History:  Procedure Laterality Date   ARTHOSCOPIC ROTAOR CUFF REPAIR Right 01/07/2018   Procedure: RIGHT SHOULDER ARTHROSCOPY, SUBACROMIAL DECOMPRESSION, OPEN ROTATOR CUFF REPAIR, OPEN DISTAL CLAVICLE EXCISION, BICEPS TENODESIS, SLAP DEBRIDMENT;  Surgeon: Netta Cedars, MD;  Location: [facility];  Service: Orthopedics;  Laterality: Right;   CARPAL TUNNEL RELEASE Right    COLONOSCOPY W/ BIOPSIES AND POLYPECTOMY     IR ANGIO EXTRACRAN SEL COM CAROTID INNOMINATE UNI R MOD SED  05/02/2017   IR GENERIC HISTORICAL  04/24/2016   IR TRANSCATH/EMBOLIZ 04/24/2016 Luanne Bras, MD [facility]   IR GENERIC HISTORICAL  04/24/2016   IR ANGIOGRAM FOLLOW UP STUDY 04/24/2016 Luanne Bras, MD [facility]   IR GENERIC HISTORICAL  04/24/2016   IR NEURO EACH ADD'L AFTER BASIC UNI RIGHT (MS) 04/24/2016 Luanne Bras, MD [facility]   IR GENERIC HISTORICAL  04/24/2016   IR ANGIO INTRA EXTRACRAN SEL INTERNAL CAROTID UNI R MOD SED 04/24/2016 Luanne Bras, MD [facility]   IR GENERIC HISTORICAL  05/19/2016   IR RADIOLOGIST EVAL & MGMT 05/19/2016 [facility]   IR GENERIC HISTORICAL  08/24/2016   IR ANGIO VERTEBRAL SEL SUBCLAVIAN INNOMINATE UNI R MOD SED 08/24/2016 Luanne Bras, MD [facility]   IR GENERIC HISTORICAL  08/24/2016   IR ANGIO INTRA EXTRACRAN SEL COM CAROTID INNOMINATE BILAT MOD SED 08/24/2016 Luanne Bras, MD [facility]   IR RADIOLOGIST EVAL & MGMT  11/29/2017   KNEE ARTHROSCOPY Right    LUMBAR DISC SURGERY     MOHS SURGERY     basal cell face   RADIOLOGY WITH  ANESTHESIA N/A 04/24/2016   Procedure: EMBOLIZATION;  Surgeon: Luanne Bras, MD;  Location: [facility];  Service: Radiology;  Laterality: N/A;   RADIOLOGY WITH ANESTHESIA N/A 05/02/2017   Procedure: RADIOLOGY WITH ANESTHESIA     EMBOLIZATION;  Surgeon: Luanne Bras, MD;  Location: [facility];  Service: Radiology;  Laterality: N/A;   TONSILLECTOMY     Social History   Socioeconomic History   Marital status: Married    Spouse name: elizabeth   Number of children: 2   Years of education: Bach   Highest education level: Not on file  Occupational History   Occupation: [employer]  Tobacco Use   Smoking  status: Former    Types: Cigarettes    Quit date: 05/01/1987    Years since quitting: 35.5   Smokeless tobacco: Never   Tobacco comments:    quit smoking 30 years ago 02/09/16  Vaping Use   Vaping Use: Never used  Substance and Sexual Activity   Alcohol use: Not Currently    Comment: occasional   Drug use: No   Sexual activity: Not on file  Other Topics Concern   Not on file  Social History Narrative   Lives at home with wife    Caffeine use- coffee, 5 cups daily   Social Determinants of Health   Financial Resource Strain: Not on file  Food Insecurity: Not on file  Transportation Needs: Not on file  Physical Activity: Not on file  Stress: Not on file  Social Connections: Not on file  Intimate Partner Violence: Not on file   Current Outpatient Medications on File Prior to Visit  Medication Sig Dispense Refill   aspirin 81 MG tablet Take 81 mg by mouth daily.      calcium carbonate (TUMS - DOSED IN MG ELEMENTAL CALCIUM) 500 MG chewable tablet Chew 1 tablet by mouth daily as needed for indigestion or heartburn.     Continuous Blood Gluc Sensor (DEXCOM G6 SENSOR) MISC 1 Device by Does not apply route See admin instructions. Change every 10 days 9 each 3   Continuous Blood Gluc Sensor (DEXCOM G7 SENSOR) MISC Use as instructed to check blood sugar daily. Change every 10 days. 9 each 3    Continuous Blood Gluc Transmit (DEXCOM G6 TRANSMITTER) MISC Use as instructed to check blood sugar. Change every 90 days. 1 each 3   Dulaglutide (TRULICITY) 3 0000000 SOPN Inject 3 mg into the skin once a week. 6 mL 3   empagliflozin (JARDIANCE) 25 MG TABS tablet Take 1 tablet (25 mg total) by mouth daily before breakfast. 90 tablet 3   Fremanezumab-vfrm (AJOVY) 225 MG/1.5ML SOAJ Inject 225 mg into the skin every 30 (thirty) days.     hydrochlorothiazide (MICROZIDE) 12.5 MG capsule Take 12.5 mg by mouth daily.     metFORMIN (GLUCOPHAGE-XR) 500 MG 24 hr tablet Take 4 tablets (2,000 mg total) by mouth daily. 360 tablet 3   metoprolol tartrate (LOPRESSOR) 25 MG tablet TAKE 1 TABLET (25 MG TOTAL) BY MOUTH 2 (TWO) TIMES DAILY AS NEEDED FOR SUSTAINED PALPITATIONS 180 tablet 3   pregabalin (LYRICA) 200 MG capsule Take 200 mg by mouth 3 (three) times daily.     pyridOXINE (VITAMIN B-6) 100 MG tablet Take 100 mg by mouth daily.     Current Facility-Administered Medications on File Prior to Visit  Medication Dose Route Frequency Provider Last Rate Last Admin   gadopentetate dimeglumine (MAGNEVIST) injection 20 mL  20 mL Intravenous Once PRN Penumalli, Earlean Polka, MD       Allergies  Allergen Reactions   Clopidogrel Other (See Comments)    " bleeding and chest burning" Burning chest and eye started bleeding   Keflex [Cephalexin] Anaphylaxis and Swelling    Throat swelling   Ivp Dye [Iodinated Contrast Media] Hives    Dye used in the 80s    Verapamil Palpitations    AV BLOCK   Glipizide Palpitations and Other (See Comments)    "Makes heart feel funny"    Family History  Problem Relation Age of Onset   Seizures Mother    Schizophrenia Father    Cancer Paternal Grandfather    Diabetes Neg  Hx    PE: BP 126/80 (BP Location: Right Arm, Patient Position: Sitting, Cuff Size: Normal)   Pulse 82   Ht 5' 5.5" (1.664 m)   Wt 227 lb 9.6 oz (103.2 kg)   SpO2 96%   BMI 37.30 kg/m  Wt Readings from  Last 3 Encounters:  11/23/22 227 lb 9.6 oz (103.2 kg)  08/15/22 233 lb 3.2 oz (105.8 kg)  07/25/22 236 lb 3.2 oz (107.1 kg)   Constitutional: overweight, in NAD Eyes: no exophthalmos ENT: no thyromegaly, no cervical lymphadenopathy Cardiovascular: RRR, No MRG Respiratory: CTA B Musculoskeletal: no deformities Skin: no rashes Neurological: no tremor with outstretched hands Diabetic Foot Exam - Simple   Simple Foot Form Diabetic Foot exam was performed with the following findings: Yes 11/23/2022  2:50 PM  Visual Inspection No deformities, no ulcerations, no other skin breakdown bilaterally: Yes Sensation Testing See comments: Yes Pulse Check Posterior Tibialis and Dorsalis pulse intact bilaterally: Yes Comments Patchy filament sensation areas B feet    ASSESSMENT: 1. DM2, non-insulin-dependent, uncontrolled, with long-term complications - PN - s/p spinal cord stimulator placement  2. HL  PLAN:  1. Patient with longstanding, uncontrolled, type 2 diabetes, on oral antidiabetic regimen with metformin and SGLT2 inhibitor and also weekly GLP-1 receptor agonist, with worse control at last visit.  At that time, sugars were higher in the previous 2 weeks most likely due to vacation.  However, he did notice that sugars are higher even before going in vacation we discussed about paying attention to his diet.  I also suggested to increase the Trulicity dose.  Of note, we tried a higher dose of Trulicity in the past but could not tolerated due to nausea.  I recommended using the Lantus to see if this helps.  We continued metformin and Jardiance at the same doses. CGM interpretation: -At today's visit, we reviewed his CGM downloads: It appears that 83% of values are in target range (goal >70%), while 17% are higher than 180 (goal <25%), and 0%.  Are lower than 70 (goal <4%).  The calculated average blood sugar is 154.  The projected HbA1c for the next 3 months (GMI) is 7.0%. -Reviewing the CGM  trends, sugars are much better compared to last visit, fluctuating within the target range with only occasional slightly higher blood sugars after meals especially after the dinner but also occasionally after breakfast.  He is planning to retire and become more active as he is now very sedentary at work.  I do not feel we need to change his regimen for now. - I suggested to:  Patient Instructions  Please start Fish oil 1000 mg 2x a day.  Please continue: - Metformin ER 2000 mg daily - Jardiance 25 mg daily - Trulicity 3 mg weekly   Please return in 4 months.  - we checked his HbA1c: 7.2% (lower) - advised to check sugars at different times of the day - 4x a day, rotating check times - advised for yearly eye exams >> he is UTD - return to clinic in 4 months  2. HL -Reviewed latest lipid panel from 10/2022 per records brought by patient: LDL at goal, triglycerides elevated -he was not on a statin.  He mentioned that his LDL cholesterol was always at goal so he did not have to start one.  We discussed that statins are usually recommended in the setting of diabetes, with guidelines varying between recommending a certain target or a certain percentage decreasing LDL. -He continues without  statins now; in the light of the new LDL, which is quite low, at 29, I did not recommend a statin.  I did recommend to try to lower the triglycerides by using fish oil.  I recommended 1000 mg twice a day.  He agrees to start this.  Philemon Kingdom, MD PhD [facility] Endocr[facility]

## 2023-02-05 ENCOUNTER — Encounter: Payer: Self-pay | Admitting: Internal Medicine

## 2023-02-06 MED ORDER — SEMAGLUTIDE (1 MG/DOSE) 4 MG/3ML ~~LOC~~ SOPN: 1.0000 mg | PEN_INJECTOR | SUBCUTANEOUS | 11 refills | Status: DC

## 2023-03-26 ENCOUNTER — Encounter: Payer: Self-pay | Admitting: Internal Medicine

## 2023-03-26 ENCOUNTER — Ambulatory Visit (INDEPENDENT_AMBULATORY_CARE_PROVIDER_SITE_OTHER): Payer: No Typology Code available for payment source | Admitting: Internal Medicine

## 2023-03-26 VITALS — BP 136/64 | HR 96 | Ht 65.5 in | Wt 219.0 lb

## 2023-03-26 DIAGNOSIS — Z7984 Long term (current) use of oral hypoglycemic drugs: Secondary | ICD-10-CM | POA: Diagnosis not present

## 2023-03-26 DIAGNOSIS — E119 Type 2 diabetes mellitus without complications: Secondary | ICD-10-CM

## 2023-03-26 DIAGNOSIS — Z7985 Long-term (current) use of injectable non-insulin antidiabetic drugs: Secondary | ICD-10-CM

## 2023-03-26 DIAGNOSIS — E1142 Type 2 diabetes mellitus with diabetic polyneuropathy: Secondary | ICD-10-CM | POA: Diagnosis not present

## 2023-03-26 DIAGNOSIS — E785 Hyperlipidemia, unspecified: Secondary | ICD-10-CM | POA: Diagnosis not present

## 2023-03-26 LAB — HEMOGLOBIN A1C: Hemoglobin A1C: 7.8

## 2023-03-26 NOTE — Patient Instructions (Signed)
Please continue: - Metformin ER 2000 mg daily - Jardiance 25 mg daily - Trulicity 3 mg weekly   Continue Fish oil 1000 mg 2x a day.  Please return in 4 months.

## 2023-03-26 NOTE — Progress Notes (Signed)
Patient ID: Nicholas Wheeler, male   DOB: [birth year], 62 y.o.   MRN: [id number]  HPI: Nicholas Wheeler is a 62 y.o.-year-old pleasant male, returning for follow-up for DM2, dx in 2011, non-insulin-dependent, uncontrolled, with complications (peripheral neuropathy). Pt. previously saw Dr. Everardo All, but last visit with me 4 months ago.  Interim history: He has no complaints today. No increased urination, blurry vision, nausea, chest pain. He lost 9 lbs before last visit. He was off Mteofmrin and Jardiance in prep. for an angiogram last week. Also, he was premedicated with steroid. He had a cervical spine steroid  injection 2 weeks ago.  Reviewed HbA1c: Lab Results  Component Value Date   HGBA1C 7.2 (A) 11/23/2022   HGBA1C 7.7 (A) 07/25/2022   HGBA1C 7.0 (A) 03/24/2022   HGBA1C 7.0 (A) 12/19/2021   HGBA1C 7.9 (A) 10/13/2021   HGBA1C 8.1 (A) 08/11/2021   HGBA1C 9.1 (A) 06/09/2021   HGBA1C 6.9 (H) 04/30/2017   HGBA1C 7.9 (H) 04/19/2016   HGBA1C 6.8 (H) 02/09/2016  02/25/2021: HbA1c 8.3%  Pt is on a regimen of: - Metformin ER 2000 mg daily >> 1000-500-500 mg  - Jardiance 25 mg daily - Trulicity 3 >> 1.5 mg weekly (dose prev. decreased because of lows) >> 3 mg weekly >> Ozempic 1 mg weekly since 02/2023 He was on glipizide in the past but this caused palpitations.  Pt checks his sugars >4x a day with his CGM:   Previously:  Previously:    Lowest sugar was 60 >> 80s >> 75; he has hypoglycemia awareness at 100.  Highest sugar was 300 (pancakes) >> 280 >> 250.  Glucometer: Accu-Chek  - no CKD, last BUN/creatinine:  Comp Metabolic Panel Reviewed date:10/30/2022 03:43:21 PM Interpretation: Performing Lab: Notes/Report: Testing Performed at: [facility], [address], [address], [place], [place] [address]  Glucose 180 70-99 mg/dL  BUN 15 5-63 mg/dL  Creatinine 8.75 6.43-3.29 mg/dl  [id number] 84 >06 calc  Sodium 138 136-145 mmol/L  Potassium 3.9 3.5-5.5 mmol/L  Chloride 100  98-107 mmol/L  CO2 30 22-32 mmol/L  Anion Gap 11.9 6.0-20.0 mmol/L  Calcium 9.8 8.6-10.3 mg/dL  CA-corrected 3.01 6.01-09.32 mg/dL  Protein, Total 7.1 3.5-5.7 g/dL  Albumin 4.6 3.2-2.0 g/dL  TBIL 0.4 2.5-4.2 mg/dL  ALP 69 70-623 U/L  AST 23 0-39 U/L  ALT 29 0-52 U/L   Lab Results  Component Value Date   BUN 12 12/30/2018   BUN 9 01/07/2018   CREATININE 1.26 (H) 12/30/2018   CREATININE 0.93 01/07/2018   -+ Mixed HL; last set of lipids: Lipid Panel w/reflex Reviewed date:10/30/2022 03:43:03 PM Interpretation: Performing Lab: Notes/Report: Testing Performed at: [facility], [address], [address], [place], [place] [address]  Cholesterol 127 <200 mg/dL  CHOL/HDL 3.4 1.5-1.7 Ratio  HDLD 37 30-70 mg/dL  Triglyceride 616 0-737 mg/dL  NHDL 90 1-062 mg/dL  LDL Chol Calc (NIH) 29 0-99 mg/dL   69/48/5462: 703/500/93/81 02/25/2021: 162/329/41/69 No results found for: "CHOL", "HDL", "LDLCALC", "LDLDIRECT", "TRIG", "CHOLHDL"  - last eye exam was: 2023. NO DR reportedly. [facility]  - + numbness and tingling in his feet.  Last foot exam 11/2022.  He has a spinal cord stimulator.  He also has a history of OSA, obesity class III, hepatic steatosis, right MCA aneurysm, migraine, GERD.  04/28/2022: TSH 2.25  ROS: + see HPI  Past Medical History:  Diagnosis Date   Aneurysm (HCC)    Right MCA.  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   Diabetes (HCC)    Type II   Diabetic neuropathy (HCC)    GERD (gastroesophageal reflux disease)    History of hiatal hernia    Hypersomnia with sleep apnea, unspecified 09/08/2013   Joint disorder    degenerated   Migraine    Obesity (BMI 35.0-39.9 without comorbidity)    Recurrent sinus infections    Skin cancer    Sleep apnea    saw Dr Vickey Huger in 09/2013; wears CPAP set at 14    TMJ (temporomandibular joint disorder)    Past Surgical History:  Procedure Laterality Date   ARTHOSCOPIC ROTAOR CUFF REPAIR Right 01/07/2018   Procedure: RIGHT SHOULDER ARTHROSCOPY, SUBACROMIAL DECOMPRESSION, OPEN ROTATOR CUFF REPAIR, OPEN DISTAL CLAVICLE EXCISION, BICEPS TENODESIS, SLAP DEBRIDMENT;  Surgeon: Beverely Low, MD;  Location: [facility] OR;  Service: Orthopedics;  Laterality: Right;   CARPAL TUNNEL RELEASE Right    COLONOSCOPY W/ BIOPSIES AND POLYPECTOMY     IR ANGIO EXTRACRAN SEL COM CAROTID INNOMINATE UNI R MOD SED  05/02/2017   IR GENERIC HISTORICAL  04/24/2016   IR TRANSCATH/EMBOLIZ 04/24/2016 Julieanne Cotton, MD [facility]   IR GENERIC HISTORICAL  04/24/2016   IR ANGIOGRAM FOLLOW UP STUDY 04/24/2016 Julieanne Cotton, MD [facility]   IR GENERIC HISTORICAL  04/24/2016   IR NEURO EACH ADD'L AFTER BASIC UNI RIGHT (MS) 04/24/2016 Julieanne Cotton, MD [facility]   IR GENERIC HISTORICAL  04/24/2016   IR ANGIO INTRA EXTRACRAN SEL INTERNAL CAROTID UNI R MOD SED 04/24/2016 Julieanne Cotton, MD [facility]   IR GENERIC HISTORICAL  05/19/2016   IR RADIOLOGIST EVAL & MGMT 05/19/2016 [facility]   IR GENERIC HISTORICAL  08/24/2016   IR ANGIO VERTEBRAL SEL SUBCLAVIAN INNOMINATE UNI R MOD SED 08/24/2016 Julieanne Cotton, MD [facility]   IR GENERIC HISTORICAL  08/24/2016   IR ANGIO INTRA EXTRACRAN SEL COM CAROTID INNOMINATE BILAT MOD SED 08/24/2016 Julieanne Cotton, MD [facility]   IR RADIOLOGIST EVAL & MGMT  11/29/2017   KNEE ARTHROSCOPY Right    LUMBAR DISC SURGERY     MOHS SURGERY     basal cell face   RADIOLOGY WITH ANESTHESIA N/A 04/24/2016   Procedure: EMBOLIZATION;  Surgeon: Julieanne Cotton, MD;  Location: [facility] OR;  Service: Radiology;  Laterality: N/A;   RADIOLOGY WITH ANESTHESIA N/A 05/02/2017   Procedure: RADIOLOGY WITH ANESTHESIA     EMBOLIZATION;  Surgeon: Julieanne Cotton, MD;  Location: [facility] OR;  Service: Radiology;  Laterality: N/A;   TONSILLECTOMY     Social  History   Socioeconomic History   Marital status: Married    Spouse name: elizabeth   Number of children: 2   Years of education: Bach   Highest education level: Not on file  Occupational History   Occupation: [employer]  Tobacco Use   Smoking status: Former    Current packs/day: 0.00    Types: Cigarettes    Quit date: 05/01/1987    Years since quitting: 35.9   Smokeless tobacco: Never   Tobacco comments:    quit smoking 30 years ago 02/09/16  Vaping Use   Vaping status: Never Used  Substance and Sexual Activity   Alcohol use: Not Currently    Comment: occasional   Drug use: No   Sexual activity: Not on file  Other Topics Concern  Not on file  Social History Narrative   Lives at home with wife    Caffeine use- coffee, 5 cups daily   Social Determinants of Health   Financial Resource Strain: Low Risk  (02/12/2023)   Received from [facility], [facility]   Overall Financial Resource Strain (CARDIA)    Difficulty of Paying Living Expenses: Not very hard  Food Insecurity: No Food Insecurity (02/12/2023)   Received from [facility], [facility]   Hunger Vital Sign    Worried About Running Out of Food in the Last Year: Never true    Ran Out of Food in the Last Year: Never true  Transportation Needs: No Transportation Needs (02/12/2023)   Received from [facility], [facility]   PRAPARE - Transportation    Lack of Transportation (Medical): No    Lack of Transportation (Non-Medical): No  Physical Activity: Insufficiently Active (02/12/2023)   Received from [facility], [facility]   Exercise Vital Sign    Days of Exercise per Week: 1 day    Minutes of Exercise per Session: 20 min  Stress: No Stress Concern Present (02/12/2023)   Received from [facility], [facility] - Occupational Stress Questionnaire    Feeling of Stress : Only a little  Social Connections: Somewhat Isolated (02/12/2023)   Received from [facility], [facility]   Social Network    How would you rate your social network (family, work, friends)?: Restricted participation with some degree of social isolation  Intimate Partner Violence: Not At Risk (02/12/2023)   Received from [facility], [facility]   HITS    Over the last 12 months how often did your partner physically hurt you?: 1    Over the last 12 months how often did your partner insult you or talk down to you?: 2    Over the last 12 months how often did your partner threaten you with physical harm?: 1    Over the last 12 months how often did your partner scream or curse at you?: 2   Current Outpatient Medications on File Prior to Visit  Medication Sig Dispense Refill   aspirin 81 MG tablet Take 81 mg by mouth daily.      calcium carbonate (TUMS - DOSED IN MG ELEMENTAL CALCIUM) 500 MG chewable tablet Chew 1 tablet by mouth daily as needed for indigestion or heartburn.     Continuous Blood Gluc Sensor (DEXCOM G7 SENSOR) MISC Use as instructed to check blood sugar daily. Change every 10 days. 9 each 3   Dulaglutide (TRULICITY) 3 MG/0.5ML SOPN Inject 3 mg into the skin once a week. 6 mL 3   empagliflozin (JARDIANCE) 25 MG TABS tablet Take 1 tablet (25 mg total) by mouth daily before breakfast. 90 tablet 3   Fremanezumab-vfrm (AJOVY) 225 MG/1.5ML SOAJ Inject 225 mg into the skin every 30 (thirty) days.     hydrochlorothiazide (MICROZIDE) 12.5 MG capsule Take 12.5 mg by mouth daily.     metFORMIN (GLUCOPHAGE-XR) 500 MG 24 hr tablet Take 4 tablets (2,000 mg total) by mouth daily. 360 tablet 3   metoprolol tartrate (LOPRESSOR) 25 MG tablet TAKE 1 TABLET (25 MG TOTAL) BY MOUTH 2 (TWO) TIMES DAILY AS NEEDED FOR SUSTAINED PALPITATIONS 180 tablet 3   pregabalin (LYRICA) 200 MG capsule Take 200 mg by mouth 3 (three) times daily.     pyridOXINE (VITAMIN B-6) 100 MG tablet Take 100 mg by mouth daily.  Semaglutide, 1 MG/DOSE, 4 MG/3ML SOPN Inject 1 mg as directed once a week. 3 mL  11   Current Facility-Administered Medications on File Prior to Visit  Medication Dose Route Frequency Provider Last Rate Last Admin   gadopentetate dimeglumine (MAGNEVIST) injection 20 mL  20 mL Intravenous Once PRN Penumalli, Glenford Bayley, MD       Allergies  Allergen Reactions   Clopidogrel Other (See Comments)    " bleeding and chest burning" Burning chest and eye started bleeding   Keflex [Cephalexin] Anaphylaxis and Swelling    Throat swelling   Ivp Dye [Iodinated Contrast Media] Hives    Dye used in the 80s    Verapamil Palpitations    AV BLOCK   Glipizide Palpitations and Other (See Comments)    "Makes heart feel funny"    Family History  Problem Relation Age of Onset   Seizures Mother    Schizophrenia Father    Cancer Paternal Grandfather    Diabetes Neg Hx    PE: BP 136/64   Pulse 96   Ht 5' 5.5" (1.664 m)   Wt 219 lb (99.3 kg)   SpO2 95%   BMI 35.89 kg/m  Wt Readings from Last 3 Encounters:  03/26/23 219 lb (99.3 kg)  11/23/22 227 lb 9.6 oz (103.2 kg)  08/15/22 233 lb 3.2 oz (105.8 kg)   Constitutional: overweight, in NAD Eyes: no exophthalmos ENT: no thyromegaly, no cervical lymphadenopathy Cardiovascular: tachycardia, RR, No MRG Respiratory: CTA B Musculoskeletal: no deformities Skin: no rashes Neurological: no tremor with outstretched hands  ASSESSMENT: 1. DM2, non-insulin-dependent, uncontrolled, with long-term complications - PN - s/p spinal cord stimulator placement  2. HL  PLAN:  1. Patient with longstanding, uncontrolled, type 2 diabetes, on oral antidiabetic regimen with metformin and SGLT2 inhibitor and also weekly GLP-1 receptor agonist.  At last visit, HbA1c was better, at 7.2%.  Sugars were fluctuating within the target range with only occasional slightly higher blood sugars after meals, especially after dinner but occasionally after breakfast.  He was planning to retire and become more active as he was very sedentary at work previously.  I  did not recommend a change in regimen at that time. -Of note, he could not tolerate the higher dose of Trulicity due to nausea. CGM interpretation: -At today's visit, we reviewed his CGM downloads: It appears that 39% of values are in target range (goal >70%), while 61% are higher than 180 (goal <25%), and 0% are lower than 70 (goal <4%).  The calculated average blood sugar is 195.  The projected HbA1c for the next 3 months (GMI) is ~8.5%. -Reviewing the CGM trends, sugars are uniformly high, fluctuating above the upper limit of the target range.  He attributes the higher blood sugars to his recent steroid intake in preparation for his angiogram and also his steroid injection in his spine.  He also mentions that he is switched from Trulicity to Ozempic 1 mg weekly as Trulicity was not available, and he is wondering if this may have contributed.  We discussed that Ozempic is stronger than Trulicity so we would be unlikely but I did advise him to continue the same regimen for now and if the sugars do not improve now that he is off steroids, he may need to switch back to Trulicity.  He agrees with the plan.  - I suggested to:  Patient Instructions  Please continue: - Metformin ER 2000 mg daily - Jardiance 25 mg daily - Trulicity 3 mg  weekly   Continue Fish oil 1000 mg 2x a day.  Please return in 4 months.  - we checked his HbA1c: 7.8% (higher) - advised to check sugars at different times of the day - 4x a day, rotating check times - advised for yearly eye exams >> he is UTD - return to clinic in 3-4 months  2. HL -Reviewed latest lipid panel from 10/2022: LDL were at goal, triglycerides elevated -He was mentioning that his LDL was always at goal so he did not have to start a statin.  We did discuss that statins are usually recommended in the setting of diabetes, with guidelines varying between recommending a certain target or a certain percentage decrease in LDL -His LDL was 29, quite low, so I  did not recommend starting a statin at last visit.  I did recommend lowering the triglycerides with fish oil 1000 mg twice a day.  He is taking this now.  -We will recheck his lipid panel at next visit  Carlus Pavlov, MD PhD [facility] Endocrinology

## 2023-04-20 ENCOUNTER — Other Ambulatory Visit: Payer: Self-pay | Admitting: Internal Medicine

## 2023-04-20 DIAGNOSIS — E1142 Type 2 diabetes mellitus with diabetic polyneuropathy: Secondary | ICD-10-CM

## 2023-07-18 ENCOUNTER — Other Ambulatory Visit: Payer: Self-pay | Admitting: Internal Medicine

## 2023-07-27 ENCOUNTER — Ambulatory Visit: Payer: No Typology Code available for payment source | Admitting: Internal Medicine

## 2023-07-31 ENCOUNTER — Ambulatory Visit (INDEPENDENT_AMBULATORY_CARE_PROVIDER_SITE_OTHER): Payer: No Typology Code available for payment source | Admitting: Internal Medicine

## 2023-07-31 ENCOUNTER — Encounter: Payer: Self-pay | Admitting: Internal Medicine

## 2023-07-31 VITALS — BP 124/70 | HR 84 | Ht 65.5 in | Wt 217.4 lb

## 2023-07-31 DIAGNOSIS — E1142 Type 2 diabetes mellitus with diabetic polyneuropathy: Secondary | ICD-10-CM | POA: Diagnosis not present

## 2023-07-31 DIAGNOSIS — Z7985 Long-term (current) use of injectable non-insulin antidiabetic drugs: Secondary | ICD-10-CM | POA: Diagnosis not present

## 2023-07-31 DIAGNOSIS — E785 Hyperlipidemia, unspecified: Secondary | ICD-10-CM

## 2023-07-31 DIAGNOSIS — Z7984 Long term (current) use of oral hypoglycemic drugs: Secondary | ICD-10-CM | POA: Diagnosis not present

## 2023-07-31 LAB — POCT GLYCOSYLATED HEMOGLOBIN (HGB A1C): Hemoglobin A1C: 7.2 % — AB (ref 4.0–5.6)

## 2023-07-31 MED ORDER — SEMAGLUTIDE (1 MG/DOSE) 4 MG/3ML ~~LOC~~ SOPN: 1.0000 mg | PEN_INJECTOR | SUBCUTANEOUS | 3 refills | Status: DC

## 2023-07-31 NOTE — Patient Instructions (Addendum)
Please continue: - Metformin ER 2000 mg daily - Jardiance 25 mg daily - Ozempic 1 mg weekly   Continue Fish oil 1000 mg 2x a day.  Please return in 4 months.

## 2023-07-31 NOTE — Progress Notes (Signed)
Patient ID: Nicholas Wheeler, male   DOB: Jul 25, 1961, 62 y.o.   MRN: [id number]  HPI: Nicholas Wheeler is a 62 y.o.-year-old pleasant male, returning for follow-up for DM2, dx in 2011, non-insulin-dependent, uncontrolled, with complications (peripheral neuropathy). Pt. previously saw Dr. Everardo All, but last visit with me 4 months ago.  Interim history: No blurry vision, nausea, chest pain.  He does have some increased urination, but not changed.  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 He has pain in feet when looking down. He is on disability. He saw pain clinics in the past and was on Methadone in the past.  He has a cord stimulator in the lower spine.  Reviewed HbA1c: Lab Results  Component Value Date   HGBA1C 7.8 03/26/2023   HGBA1C 7.2 (A) 11/23/2022   HGBA1C 7.7 (A) 07/25/2022   HGBA1C 7.0 (A) 03/24/2022   HGBA1C 7.0 (A) 12/19/2021   HGBA1C 7.9 (A) 10/13/2021   HGBA1C 8.1 (A) 08/11/2021   HGBA1C 9.1 (A) 06/09/2021   HGBA1C 6.9 (H) 04/30/2017   HGBA1C 7.9 (H) 04/19/2016  02/25/2021: HbA1c 8.3%  Pt is on a regimen of: - Metformin ER 2000 mg daily >> 1000-500-500 mg  - Jardiance 25 mg daily - Trulicity 3 >> 1.5 mg weekly (dose prev. decreased because of lows) >> 3 mg weekly >> Ozempic 1 mg weekly since 02/2023 He was on glipizide in the past but this caused palpitations.  Pt checks his sugars >4x a day with his CGM:   Prev.:    Previously:  Lowest sugar was 60 >> 80s >> 75; he has hypoglycemia awareness at 100.  Highest sugar was 300 (pancakes) >> 280 >> 250.  Glucometer: Accu-Chek  - no CKD, last BUN/creatinine:   Comp Metabolic Panel Reviewed date:10/30/2022 03:43:21 PM Interpretation: Performing Lab: Notes/Report: Testing Performed at: [facility], [address], [address], [place], [place] [address]  Glucose 180 70-99 mg/dL  BUN 15 6-21 mg/dL  Creatinine 3.08 6.57-8.46 mg/dl  [id number] 84 >13 calc  Sodium 138 136-145 mmol/L  Potassium 3.9 3.5-5.5 mmol/L   Chloride 100 98-107 mmol/L  CO2 30 22-32 mmol/L  Anion Gap 11.9 6.0-20.0 mmol/L  Calcium 9.8 8.6-10.3 mg/dL  CA-corrected 2.44 0.10-27.25 mg/dL  Protein, Total 7.1 3.6-6.4 g/dL  Albumin 4.6 4.0-3.4 g/dL  TBIL 0.4 7.4-2.5 mg/dL  ALP 69 95-638 U/L  AST 23 0-39 U/L  ALT 29 0-52 U/L   Lab Results  Component Value Date   BUN 12 12/30/2018   BUN 9 01/07/2018   CREATININE 1.26 (H) 12/30/2018   CREATININE 0.93 01/07/2018  No results found for: "MICRALBCREAT" -+ Mixed HL; last set of lipids:  Lipid Panel w/reflex Reviewed date:10/30/2022 03:43:03 PM Interpretation: Performing Lab: Notes/Report: Testing Performed at: [facility], [address], [place], [place] [address]  Cholesterol 127 <200 mg/dL  CHOL/HDL 3.4 3.2-9.5 Ratio  HDLD 37 30-70 mg/dL  Triglyceride 188 4-166 mg/dL  NHDL 90 0-630 mg/dL  LDL Chol Calc (NIH) 29 0-99 mg/dL   16/09/930: 355/732/20/25 02/25/2021: 162/329/41/69 No results found for: "CHOL", "HDL", "LDLCALC", "LDLDIRECT", "TRIG", "CHOLHDL"  - last eye exam was: 03/2023. No DR reportedly.   - + numbness and tingling in his feet.  Last foot exam 11/23/2022.  He has a spinal cord stimulator.  He also has a history of OSA, obesity class III, hepatic steatosis, right MCA aneurysm, migraine, GERD.  04/28/2022: TSH 2.25  ROS: + see HPI  Past Medical History:  Diagnosis Date   Aneurysm (  HCC)    Right MCA.    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   Diabetes (HCC)    Type II   Diabetic neuropathy (HCC)    GERD (gastroesophageal reflux disease)    History of hiatal hernia    Hypersomnia with sleep apnea, unspecified 09/08/2013   Joint disorder    degenerated   Migraine    Obesity (BMI 35.0-39.9 without comorbidity)    Recurrent sinus infections    Skin cancer    Sleep apnea    saw Dr Vickey Huger in 09/2013;  wears CPAP set at 14   TMJ (temporomandibular joint disorder)    Past Surgical History:  Procedure Laterality Date   ARTHOSCOPIC ROTAOR CUFF REPAIR Right 01/07/2018   Procedure: RIGHT SHOULDER ARTHROSCOPY, SUBACROMIAL DECOMPRESSION, OPEN ROTATOR CUFF REPAIR, OPEN DISTAL CLAVICLE EXCISION, BICEPS TENODESIS, SLAP DEBRIDMENT;  Surgeon: Beverely Low, MD;  Location: [facility] OR;  Service: Orthopedics;  Laterality: Right;   CARPAL TUNNEL RELEASE Right    COLONOSCOPY W/ BIOPSIES AND POLYPECTOMY     IR ANGIO EXTRACRAN SEL COM CAROTID INNOMINATE UNI R MOD SED  05/02/2017   IR GENERIC HISTORICAL  04/24/2016   IR TRANSCATH/EMBOLIZ 04/24/2016 Julieanne Cotton, MD [facility]   IR GENERIC HISTORICAL  04/24/2016   IR ANGIOGRAM FOLLOW UP STUDY 04/24/2016 Julieanne Cotton, MD [facility]   IR GENERIC HISTORICAL  04/24/2016   IR NEURO EACH ADD'L AFTER BASIC UNI RIGHT (MS) 04/24/2016 Julieanne Cotton, MD [facility]   IR GENERIC HISTORICAL  04/24/2016   IR ANGIO INTRA EXTRACRAN SEL INTERNAL CAROTID UNI R MOD SED 04/24/2016 Julieanne Cotton, MD [facility]   IR GENERIC HISTORICAL  05/19/2016   IR RADIOLOGIST EVAL & MGMT 05/19/2016 [facility]   IR GENERIC HISTORICAL  08/24/2016   IR ANGIO VERTEBRAL SEL SUBCLAVIAN INNOMINATE UNI R MOD SED 08/24/2016 Julieanne Cotton, MD [facility]   IR GENERIC HISTORICAL  08/24/2016   IR ANGIO INTRA EXTRACRAN SEL COM CAROTID INNOMINATE BILAT MOD SED 08/24/2016 Julieanne Cotton, MD [facility]   IR RADIOLOGIST EVAL & MGMT  11/29/2017   KNEE ARTHROSCOPY Right    LUMBAR DISC SURGERY     MOHS SURGERY     basal cell face   RADIOLOGY WITH ANESTHESIA N/A 04/24/2016   Procedure: EMBOLIZATION;  Surgeon: Julieanne Cotton, MD;  Location: [facility] OR;  Service: Radiology;  Laterality: N/A;   RADIOLOGY WITH ANESTHESIA N/A 05/02/2017   Procedure: RADIOLOGY WITH ANESTHESIA     EMBOLIZATION;  Surgeon: Julieanne Cotton, MD;  Location: [facility] OR;  Service: Radiology;  Laterality: N/A;    TONSILLECTOMY     Social History   Socioeconomic History   Marital status: Married    Spouse name: elizabeth   Number of children: 2   Years of education: Bach   Highest education level: Not on file  Occupational History   Occupation: [employer]  Tobacco Use   Smoking status: Former    Current packs/day: 0.00    Types: Cigarettes    Quit date: 05/01/1987    Years since quitting: 36.2   Smokeless tobacco: Never   Tobacco comments:    quit smoking 30 years ago 02/09/16  Vaping Use   Vaping status: Never Used  Substance and Sexual Activity   Alcohol use: Not Currently    Comment: occasional   Drug use: No  Sexual activity: Not on file  Other Topics Concern   Not on file  Social History Narrative   Lives at home with wife    Caffeine use- coffee, 5 cups daily   Social Determinants of Health   Financial Resource Strain: Low Risk  (02/12/2023)   Received from [facility], [facility]   Overall Financial Resource Strain (CARDIA)    Difficulty of Paying Living Expenses: Not very hard  Food Insecurity: No Food Insecurity (02/12/2023)   Received from [facility], [facility]   Hunger Vital Sign    Worried About Running Out of Food in the Last Year: Never true    Ran Out of Food in the Last Year: Never true  Transportation Needs: No Transportation Needs (02/12/2023)   Received from [facility], [facility]   PRAPARE - Transportation    Lack of Transportation (Medical): No    Lack of Transportation (Non-Medical): No  Physical Activity: Insufficiently Active (02/12/2023)   Received from [facility], [facility]   Exercise Vital Sign    Days of Exercise per Week: 1 day    Minutes of Exercise per Session: 20 min  Stress: No Stress Concern Present (02/12/2023)   Received from [facility], [facility] - Occupational Stress Questionnaire    Feeling of Stress : Only a little  Social Connections: Somewhat Isolated (02/12/2023)    Received from [facility], [facility]   Social Network    How would you rate your social network (family, work, friends)?: Restricted participation with some degree of social isolation  Intimate Partner Violence: Not At Risk (02/12/2023)   Received from [facility], [facility]   HITS    Over the last 12 months how often did your partner physically hurt you?: Never    Over the last 12 months how often did your partner insult you or talk down to you?: Rarely    Over the last 12 months how often did your partner threaten you with physical harm?: Never    Over the last 12 months how often did your partner scream or curse at you?: Rarely   Current Outpatient Medications on File Prior to Visit  Medication Sig Dispense Refill   aspirin 81 MG tablet Take 81 mg by mouth daily.      calcium carbonate (TUMS - DOSED IN MG ELEMENTAL CALCIUM) 500 MG chewable tablet Chew 1 tablet by mouth daily as needed for indigestion or heartburn.     Continuous Blood Gluc Sensor (DEXCOM G7 SENSOR) MISC Use as instructed to check blood sugar daily. Change every 10 days. 9 each 3   Fremanezumab-vfrm (AJOVY) 225 MG/1.5ML SOAJ Inject 225 mg into the skin every 30 (thirty) days.     hydrochlorothiazide (MICROZIDE) 12.5 MG capsule Take 12.5 mg by mouth daily.     JARDIANCE 25 MG TABS tablet TAKE 1 TABLET BY MOUTH DAILY BEFORE BREAKFAST. 90 tablet 3   metFORMIN (GLUCOPHAGE-XR) 500 MG 24 hr tablet TAKE 4 TABLETS (2,000 MG TOTAL) BY MOUTH DAILY 360 tablet 1   metoprolol tartrate (LOPRESSOR) 25 MG tablet TAKE 1 TABLET (25 MG TOTAL) BY MOUTH 2 (TWO) TIMES DAILY AS NEEDED FOR SUSTAINED PALPITATIONS 180 tablet 3   pregabalin (LYRICA) 200 MG capsule Take 200 mg by mouth 3 (three) times daily.     pyridOXINE (VITAMIN B-6) 100 MG tablet Take 100 mg by mouth daily.     Semaglutide, 1 MG/DOSE, 4 MG/3ML SOPN Inject 1 mg as  directed once a week. 3 mL 11   Current Facility-Administered Medications on File Prior to Visit   Medication Dose Route Frequency Provider Last Rate Last Admin   gadopentetate dimeglumine (MAGNEVIST) injection 20 mL  20 mL Intravenous Once PRN Penumalli, Glenford Bayley, MD       Allergies  Allergen Reactions   Clopidogrel Other (See Comments)    " bleeding and chest burning" Burning chest and eye started bleeding   Keflex [Cephalexin] Anaphylaxis and Swelling    Throat swelling   Ivp Dye [Iodinated Contrast Media] Hives    Dye used in the 80s    Verapamil Palpitations    AV BLOCK   Glipizide Palpitations and Other (See Comments)    "Makes heart feel funny"    Family History  Problem Relation Age of Onset   Seizures Mother    Schizophrenia Father    Cancer Paternal Grandfather    Diabetes Neg Hx    PE: BP 124/70   Pulse 84   Ht 5' 5.5" (1.664 m)   Wt 217 lb 6.4 oz (98.6 kg)   SpO2 95%   BMI 35.63 kg/m  Wt Readings from Last 3 Encounters:  07/31/23 217 lb 6.4 oz (98.6 kg)  03/26/23 219 lb (99.3 kg)  11/23/22 227 lb 9.6 oz (103.2 kg)   Constitutional: overweight, in NAD Eyes: no exophthalmos ENT: no thyromegaly, no cervical lymphadenopathy Cardiovascular: RRR, No MRG Respiratory: CTA B Musculoskeletal: no deformities Skin: no rashes Neurological: no tremor with outstretched hands  ASSESSMENT: 1. DM2, non-insulin-dependent, uncontrolled, with long-term complications - PN - s/p spinal cord stimulator placement  2. HL  PLAN:  1. Patient with longstanding, uncontrolled, type 2 diabetes, on oral antidiabetic regimen with metformin and SGLT2 inhibitor and also on weekly GLP-1 receptor agonist, with worse control at last visit, when HbA1c returned 7.8%, increased from 7.2%.  At last visit, sugars were uniformly high, fluctuating around the upper limit of the target range.  He attributed the higher blood sugars to steroid intake in preparation for angiogram and also steroid injections in spine.  He was on Ozempic at that time as Trulicity was not available but we discussed  that this was stronger, and likely not the reason for his increased blood sugars.  We did not change the regimen at that time after he came off the steroids to see if sugars would improve.  I did advise him that if they did not, to let me know so we could try Trulicity again. -Of note he could not tolerate higher doses of Trulicity due to nausea. CGM interpretation: -At today's visit, we reviewed his CGM downloads: It appears that 93 of values are in target range (goal >70%), while 7% are higher than 180 (goal <25%), and 0% are lower than 70 (goal <4%).  The calculated average blood sugar is 140.  The projected HbA1c for the next 3 months (GMI) is 6.7%. -Reviewing the CGM trends, sugars are much better.  He has occasional increases in blood sugars after breakfast and after dinner, but with the majority of the blood sugar still at goal, I advised him to continue the current regimen. - I suggested to:  Patient Instructions  Please continue: - Metformin ER 2000 mg daily - Jardiance 25 mg daily - Trulicity 3 mg weekly   Continue Fish oil 1000 mg 2x a day.  Please return in 3-4 months.  - we checked his HbA1c: 7.2% (improved) - advised to check sugars at different times of the  day - 4x a day, rotating check times - advised for yearly eye exams >> he is UTD - will check an ACR today - return to clinic in 3-4 months  2. HL -Reviewed latest lipid panel from 10/2022 and 04/2023: Fractions at goal with exception of high triglycerides -He previously mentions that his LDL is usually at goal and he did not have to use a statin.  We did discuss that statins are usually recommended in the setting of diabetes, with guidelines varying between recommending a certain target or a certain percentage decrease in LDL.  His LDL was 29 at last check so I did not recommend a statin at that time but I did recommend lowering the triglycerides with fish oil 1000 mg twice a day.  He continues this. -At today's visit, he  mentions that he would not want to start on a statin as he read that this can cause neurologic problems.  Carlus Pavlov, MD PhD [facility] Endocrinology

## 2023-08-01 LAB — MICROALBUMIN / CREATININE URINE RATIO
Creatinine, Urine: 62 mg/dL (ref 20–320)
Microalb Creat Ratio: 3 mg/g{creat} (ref <30)
Microalb, Ur: 0.2 mg/dL

## 2023-08-13 ENCOUNTER — Emergency Department (HOSPITAL_BASED_OUTPATIENT_CLINIC_OR_DEPARTMENT_OTHER): Payer: No Typology Code available for payment source

## 2023-08-13 ENCOUNTER — Encounter (HOSPITAL_BASED_OUTPATIENT_CLINIC_OR_DEPARTMENT_OTHER): Payer: Self-pay | Admitting: Emergency Medicine

## 2023-08-13 ENCOUNTER — Emergency Department (HOSPITAL_BASED_OUTPATIENT_CLINIC_OR_DEPARTMENT_OTHER)
Admission: EM | Admit: 2023-08-13 | Discharge: 2023-08-13 | Disposition: A | Discharge status: Home / Self Care | Payer: No Typology Code available for payment source

## 2023-08-13 ENCOUNTER — Other Ambulatory Visit: Payer: Self-pay

## 2023-08-13 DIAGNOSIS — Z794 Long term (current) use of insulin: Secondary | ICD-10-CM | POA: Insufficient documentation

## 2023-08-13 DIAGNOSIS — E1140 Type 2 diabetes mellitus with diabetic neuropathy, unspecified: Secondary | ICD-10-CM | POA: Diagnosis not present

## 2023-08-13 DIAGNOSIS — M7989 Other specified soft tissue disorders: Secondary | ICD-10-CM | POA: Diagnosis present

## 2023-08-13 DIAGNOSIS — L03031 Cellulitis of right toe: Secondary | ICD-10-CM | POA: Insufficient documentation

## 2023-08-13 DIAGNOSIS — Z7982 Long term (current) use of aspirin: Secondary | ICD-10-CM | POA: Diagnosis not present

## 2023-08-13 DIAGNOSIS — Z7984 Long term (current) use of oral hypoglycemic drugs: Secondary | ICD-10-CM | POA: Diagnosis not present

## 2023-08-13 MED ORDER — CLINDAMYCIN HCL 150 MG PO CAPS: 450.0000 mg | ORAL_CAPSULE | Freq: Three times a day (TID) | ORAL | 0 refills | Status: AC

## 2023-08-13 NOTE — ED Provider Notes (Signed)
[place] EMERGENCY DEPARTMENT AT [facility] Provider Note   CSN: [id number] Arrival date & time: 08/13/23  1150     History  Chief Complaint  Patient presents with   Wound Check    Nicholas Wheeler is a 62 y.o. male with past medical history significant for obesity, diabetes, diabetic neuropathy presents to the ED from urgent care due to concern for infection on his right third toe.  Patient reports he was clipping his toe nails Wednesday and cut a part of his toe.  He did not notice swelling until Friday.  He states it was "purple, swollen and angry looking" so he poked it to drain it.  He was able to drain a significant amount of dark blood and pus.  Urgent care sent patient due to concerns for possible osteomyelitis or deep infection given he is diabetic and has severe neuropathy.  Patient reports the toe has been looking better every day.  Denies pain, fever, chills.         Home Medications Prior to Admission medications   Medication Sig Start Date End Date Taking? Authorizing Provider  clindamycin (CLEOCIN) 150 MG capsule Take 3 capsules (450 mg total) by mouth 3 (three) times daily for 7 days. 08/13/23 08/20/23 Yes Gracin Mcpartland R, PA-C  DULoxetine (CYMBALTA) 30 MG capsule Take 30 mg by mouth daily. 06/22/23  Yes [provider]  aspirin 81 MG tablet Take 81 mg by mouth daily.     [provider]  baclofen (LIORESAL) 10 MG tablet Take 10 mg by mouth. 09/05/23 12/04/23  [provider]  calcium carbonate (TUMS - DOSED IN MG ELEMENTAL CALCIUM) 500 MG chewable tablet Chew 1 tablet by mouth daily as needed for indigestion or heartburn.    [provider]  Continuous Blood Gluc Sensor (DEXCOM G7 SENSOR) MISC Use as instructed to check blood sugar daily. Change every 10 days. 07/25/22   Carlus Pavlov, MD  Fremanezumab-vfrm (AJOVY) 225 MG/1.5ML SOAJ Inject 225 mg into the skin every 30 (thirty) days.    [provider]   hydrochlorothiazide (MICROZIDE) 12.5 MG capsule Take 12.5 mg by mouth daily.    [provider]  JARDIANCE 25 MG TABS tablet TAKE 1 TABLET BY MOUTH DAILY BEFORE BREAKFAST. 07/18/23   Carlus Pavlov, MD  metFORMIN (GLUCOPHAGE-XR) 500 MG 24 hr tablet TAKE 4 TABLETS (2,000 MG TOTAL) BY MOUTH DAILY 04/20/23   Carlus Pavlov, MD  metoprolol tartrate (LOPRESSOR) 25 MG tablet TAKE 1 TABLET (25 MG TOTAL) BY MOUTH 2 (TWO) TIMES DAILY AS NEEDED FOR SUSTAINED PALPITATIONS 08/23/22   Rollene Rotunda, MD  pregabalin (LYRICA) 200 MG capsule Take 200 mg by mouth 3 (three) times daily.    [provider]  pyridOXINE (VITAMIN B-6) 100 MG tablet Take 100 mg by mouth daily.    [provider]  Semaglutide, 1 MG/DOSE, 4 MG/3ML SOPN Inject 1 mg as directed once a week. 07/31/23   Carlus Pavlov, MD      Allergies    Clopidogrel, Keflex [cephalexin], Ivp dye [iodinated contrast media], Verapamil, and Glipizide    Review of Systems   Review of Systems  Constitutional:  Negative for chills and fever.  Skin:  Positive for color change and wound.    Physical Exam Updated Vital Signs BP 133/81 (BP Location: Left Arm)   Pulse 72   Temp 97.8 F (36.6 C) (Oral)   Resp 16   Ht 5\' 7"  (1.702 m)   Wt 96.2 kg  SpO2 93%   BMI 33.20 kg/m  Physical Exam Vitals and nursing note reviewed.  Constitutional:      General: He is not in acute distress.    Appearance: Normal appearance. He is not ill-appearing or diaphoretic.  Cardiovascular:     Rate and Rhythm: Normal rate and regular rhythm.     Pulses:          Dorsalis pedis pulses are 2+ on the right side.  Pulmonary:     Effort: Pulmonary effort is normal.  Musculoskeletal:       Feet:  Feet:     Right foot:     Skin integrity: Erythema and warmth present. No ulcer, blister or skin breakdown.     Toenail Condition: Right toenails are abnormally thick.     Comments: Normal ROM of all digits on the right foot.  DP pulse  is 2+.  Minimally increased warmth.   Skin:    General: Skin is warm and dry.     Capillary Refill: Capillary refill takes less than 2 seconds.  Neurological:     Mental Status: He is alert. Mental status is at baseline.  Psychiatric:        Mood and Affect: Mood normal.        Behavior: Behavior normal.     ED Results / Procedures / Treatments   Labs (all labs ordered are listed, but only abnormal results are displayed) Labs Reviewed - No data to display  EKG None  Radiology DG Foot Complete Right  Result Date: 08/13/2023 CLINICAL DATA:  Possible infected wound in third toe of right foot. Patient was clipping toenails 5 days prior and cut part of toe. Drained pus out of toe over weekend. History of diabetes and neuropathy. EXAM: RIGHT FOOT COMPLETE - 3+ VIEW COMPARISON:  None Available. FINDINGS: Normal bone mineralization. Mild-to-moderate plantar calcaneal heel spur. Mild dorsal talonavicular and tarsometatarsal degenerative osteophytosis on lateral view. There are two likely chronic ossicles, each measuring 3 mm, seen overlying the lateral aspect of the second tarsometatarsal joint on frontal view and localized to the plantar aspect of the joint on lateral view. No acute fracture dislocation. No cortical erosion. a mild irregularity of the distal medial aspect of third toe soft tissues may correspond to the reported injury. No subcutaneous air. IMPRESSION: 1. Mild-to-moderate plantar calcaneal heel spur. 2. Mild dorsal talonavicular and tarsometatarsal degenerative osteophytosis. 3. No radiographic evidence of osteomyelitis. Electronically Signed   By: Neita Garnet M.D.   On: 08/13/2023 15:30    Procedures Procedures    Medications Ordered in ED Medications - No data to display  ED Course/ Medical Decision Making/ A&P                                 Medical Decision Making Amount and/or Complexity of Data Reviewed Radiology: ordered.  Risk Prescription drug  management.   This patient presents to the ED with chief complaint(s) of right third toe redness, drainage with pertinent past medical history of diabetes.  The complaint involves an extensive differential diagnosis and also carries with it a high risk of complications and morbidity.    The differential diagnosis includes paronychia, cellulitis, osteomyelitis   Initial Assessment:   Exam significant for a small, open wound without active drainage to the medial side of the right third toe.  There is some dried blood and cellulitis.  Cellulitis does not extend beyond the joint.  No palpable fluctuance.  No drainage with palpation.  DP pulse is 2+.  Toe does not appear significant swollen.  Normal ROM.  No other abnormalities appreciated on exam.   Appears patient likely had paronychia that he was able to drain and now has remaining cellulitis.   Independent interpretation of imaging: No evidence of osteomyelitis on right foot x-ray.  Disposition:   Will send patient home on clindamycin for cellulitis with MRSA coverage.  Advised patient to do warm water foot soaks 2-3 days to help encourage continued drainage.  Recommended follow up with PCP.  The patient has been appropriately medically screened and/or stabilized in the ED. I have low suspicion for any other emergent medical condition which would require further screening, evaluation or treatment in the ED or require inpatient management. At time of discharge the patient is hemodynamically stable and in no acute distress. I have discussed work-up results and diagnosis with patient and answered all questions. Patient is agreeable with discharge plan. We discussed strict return precautions for returning to the emergency department and they verbalized understanding.             Final Clinical Impression(s) / ED Diagnoses Final diagnoses:  Paronychia of third toe, right  Cellulitis of toe of right foot    Rx / DC Orders ED Discharge Orders           Ordered    clindamycin (CLEOCIN) 150 MG capsule  3 times daily        08/13/23 1543              Lenard Simmer, PA-C 08/13/23 1753    Coral Spikes, DO 08/14/23 0945

## 2023-08-13 NOTE — ED Triage Notes (Signed)
Pt arrived POV from UC. Pt c/o possible infected wound in middle toe on R foot. Pt reports he was clipping his toe nails Wednesday and cut a part of his toe and that he drained pus out of the toe this weekend. Pt states he went to UC d/t PMH diabetes and neuropathy with the cut on the toe and they told him to be further seen in the ED. Pt denies pain. Pt denies fever.

## 2023-08-13 NOTE — Discharge Instructions (Addendum)
Thank you for allowing [place] to be a part of your care today. You were evaluated in the ED for infection to your toe. Your x-ray did not show evidence of infection in the bone.   I am sending you home on Clindamycin (this medication also covers MRSA so you only need the 1 antibiotic). Take for the entire 7 days even if your symptoms begin to improve.    Soak your foot in warm water 2-3 times per day for 20 minutes at a time to help encourage the area to keep training.  Schedule a follow up appointment with your primary care doctor.   Return to the ED if you develop sudden worsening of your symptoms or if you have new concerns.

## 2023-08-28 ENCOUNTER — Other Ambulatory Visit: Payer: Self-pay | Admitting: Internal Medicine

## 2023-08-28 DIAGNOSIS — E1142 Type 2 diabetes mellitus with diabetic polyneuropathy: Secondary | ICD-10-CM

## 2023-09-04 ENCOUNTER — Other Ambulatory Visit: Payer: Self-pay | Admitting: Internal Medicine

## 2023-09-04 DIAGNOSIS — E1165 Type 2 diabetes mellitus with hyperglycemia: Secondary | ICD-10-CM

## 2023-10-01 ENCOUNTER — Encounter: Payer: Self-pay | Admitting: Internal Medicine

## 2023-10-03 MED ORDER — TIRZEPATIDE 5 MG/0.5ML ~~LOC~~ SOAJ: 5.0000 mg | SUBCUTANEOUS | 3 refills | Status: DC

## 2023-10-03 NOTE — Addendum Note (Signed)
Addended by: Pollie Meyer on: 10/03/2023 02:30 PM   Modules accepted: Orders

## 2023-10-17 ENCOUNTER — Ambulatory Visit (INDEPENDENT_AMBULATORY_CARE_PROVIDER_SITE_OTHER): Payer: No Typology Code available for payment source | Admitting: Podiatry

## 2023-10-17 ENCOUNTER — Ambulatory Visit (INDEPENDENT_AMBULATORY_CARE_PROVIDER_SITE_OTHER): Payer: No Typology Code available for payment source

## 2023-10-17 ENCOUNTER — Encounter: Payer: Self-pay | Admitting: Podiatry

## 2023-10-17 DIAGNOSIS — M778 Other enthesopathies, not elsewhere classified: Secondary | ICD-10-CM

## 2023-10-17 DIAGNOSIS — M722 Plantar fascial fibromatosis: Secondary | ICD-10-CM | POA: Diagnosis not present

## 2023-10-17 DIAGNOSIS — G629 Polyneuropathy, unspecified: Secondary | ICD-10-CM

## 2023-10-17 MED ORDER — TRIAMCINOLONE ACETONIDE 10 MG/ML IJ SUSP
10.0000 mg | Freq: Once | INTRAMUSCULAR | Status: AC
  Administered 2023-10-17: 10 mg via INTRA_ARTICULAR

## 2023-10-17 NOTE — Progress Notes (Signed)
Subjective:   Patient ID: Nicholas Wheeler, male   DOB: 63 y.o.   MRN: [id number]   HPI Patient presents stating that he is getting a lot of pain in his left heel as problem #1 and also is getting a lot of nerve pain and states that it has been going on for a long time and seems to occur when he moves his head or arms and that they have not been able to figure it out he has been to numerous doctors.  Patient does not smoke likes to be active   Review of Systems  All other systems reviewed and are negative.       Objective:  Physical Exam Vitals and nursing note reviewed.  Constitutional:      Appearance: He is well-developed.  Pulmonary:     Effort: Pulmonary effort is normal.  Musculoskeletal:        General: Normal range of motion.  Skin:    General: Skin is warm.  Neurological:     Mental Status: He is alert.     Neurovascular status intact muscle strength found to be adequate range of motion within normal limits with patient noted to have exquisite discomfort medial fascial band left at the insertional point of the tendon into the calcaneus fluid buildup noted around the area     Assessment:  Acute plantar fasciitis left with a unusual neuropathy condition where patient has been in numerous doctors have not been able to identify the cause     Plan:  H&P reviewed and at this point I did discuss the neuropathy and I thought about Hulda Marin but when discussing this with him he had tried capsaicin wants and had a horrible reaction.  At this point I am focusing on the plantar fasciitis and I went ahead and I injected the left plantar fascia at insertion 3 mg Kenalog 5 mg Xylocaine applied fascial brace I discussed orthotics reappoint in several weeks to reevaluate  X-rays indicate small spur no indication of stress fracture arthritis

## 2023-10-31 ENCOUNTER — Ambulatory Visit (INDEPENDENT_AMBULATORY_CARE_PROVIDER_SITE_OTHER): Payer: No Typology Code available for payment source | Admitting: Podiatry

## 2023-10-31 ENCOUNTER — Encounter: Payer: Self-pay | Admitting: Podiatry

## 2023-10-31 DIAGNOSIS — M722 Plantar fascial fibromatosis: Secondary | ICD-10-CM | POA: Diagnosis not present

## 2023-10-31 DIAGNOSIS — G629 Polyneuropathy, unspecified: Secondary | ICD-10-CM

## 2023-10-31 NOTE — Progress Notes (Signed)
 Subjective:   Patient ID: Marland Kitchen, male   DOB: 63 y.o.   MRN: [id number]   HPI Patient presents stating my heel pain is somewhat better still hurts worse in the morning after sitting and I still have my neurological condition neurovascular   ROS      Objective:  Physical Exam  Status intact with unusual nerve root condition that occurs when he moves his arm and his neck with inflammation pain plantar fascia left     Assessment:  Plantar fasciitis left some improvement but needs to be stretch better with a usual neuropathic condition     Plan:  H&P reviewed still may consider doing nerve biopsies on him but at this point I did reinject the plantar fascia 3 mg Kenalog 5 mg Xylocaine and dispensed night splint with all instructions on usage and will be seen back to recheck 3 weeks

## 2023-11-21 ENCOUNTER — Encounter: Payer: Self-pay | Admitting: Podiatry

## 2023-11-21 ENCOUNTER — Ambulatory Visit (INDEPENDENT_AMBULATORY_CARE_PROVIDER_SITE_OTHER): Payer: No Typology Code available for payment source | Admitting: Podiatry

## 2023-11-21 DIAGNOSIS — M722 Plantar fascial fibromatosis: Secondary | ICD-10-CM

## 2023-11-21 DIAGNOSIS — G629 Polyneuropathy, unspecified: Secondary | ICD-10-CM

## 2023-11-22 NOTE — Progress Notes (Signed)
 Subjective:   Patient ID: Nicholas Wheeler, male   DOB: 63 y.o.   MRN: [id number]   HPI Patient states he is somewhat improved but still has a lot of problems with his left heel and also has significant problems with nerve compression   ROS      Objective:  Physical Exam  Neurovascular status unchanged with the patient found to have continued discomfort plantar fascia left improved with injections and night splint with a very unusual nerve compression that occurs with movement     Assessment:  Fascial inflammation brought down as best we can left with chronic neurological condition     Plan:  H&P reviewed and will continue night splint heat ice therapy and periodic injections to the area.  He will continue to pursue treatments for his neurological condition and I recommended neurosurgeon to be reevaluated

## 2023-11-30 ENCOUNTER — Ambulatory Visit: Payer: No Typology Code available for payment source | Admitting: Internal Medicine

## 2023-11-30 ENCOUNTER — Encounter: Payer: Self-pay | Admitting: Internal Medicine

## 2023-11-30 VITALS — BP 120/70 | HR 79 | Ht 67.0 in | Wt 211.6 lb

## 2023-11-30 DIAGNOSIS — E785 Hyperlipidemia, unspecified: Secondary | ICD-10-CM

## 2023-11-30 DIAGNOSIS — E1165 Type 2 diabetes mellitus with hyperglycemia: Secondary | ICD-10-CM

## 2023-11-30 DIAGNOSIS — Z7984 Long term (current) use of oral hypoglycemic drugs: Secondary | ICD-10-CM

## 2023-11-30 DIAGNOSIS — Z7985 Long-term (current) use of injectable non-insulin antidiabetic drugs: Secondary | ICD-10-CM | POA: Diagnosis not present

## 2023-11-30 DIAGNOSIS — E1142 Type 2 diabetes mellitus with diabetic polyneuropathy: Secondary | ICD-10-CM

## 2023-11-30 LAB — POCT GLYCOSYLATED HEMOGLOBIN (HGB A1C): Hemoglobin A1C: 6.8 % — AB (ref 4.0–5.6)

## 2023-11-30 MED ORDER — METFORMIN HCL ER 500 MG PO TB24: 2000.0000 mg | ORAL_TABLET | Freq: Every day | ORAL | 3 refills | Status: AC

## 2023-11-30 MED ORDER — TIRZEPATIDE 5 MG/0.5ML ~~LOC~~ SOAJ: 5.0000 mg | SUBCUTANEOUS | 3 refills | Status: DC

## 2023-11-30 NOTE — Progress Notes (Signed)
 Patient ID: Nicholas Wheeler, male   DOB: 06/04/1961, 63 y.o.   MRN: [id number]  HPI: Nicholas Wheeler is a 63 y.o.-year-old pleasant male, returning for follow-up for DM2, dx in 2011, non-insulin-dependent, uncontrolled, with complications (peripheral neuropathy). Pt. previously saw Dr. Everardo All, but last visit with me 4 months ago.  Interim history: No blurry vision, increased urination, nausea.  He has occasional gas pains. He has severe DDD in the upper and lower. He has pain in feet when looking down. He is on disability. He saw pain clinics in the past and was on Methadone in the past.  He has a cord stimulator in the lower spine but this is not helping with the pain in his upper back and the pain radiating down his arm and legs.  Insurance is not covering the surgery for him. He had a Kenalog injection for plantar fasciitis 10/17/2023 by Dr. Charlsie Merles. In the last 2 weeks, he has been traveling and ate out more.  Sugars are higher.  Reviewed HbA1c: Lab Results  Component Value Date   HGBA1C 7.2 (A) 07/31/2023   HGBA1C 7.8 03/26/2023   HGBA1C 7.2 (A) 11/23/2022   HGBA1C 7.7 (A) 07/25/2022   HGBA1C 7.0 (A) 03/24/2022   HGBA1C 7.0 (A) 12/19/2021   HGBA1C 7.9 (A) 10/13/2021   HGBA1C 8.1 (A) 08/11/2021   HGBA1C 9.1 (A) 06/09/2021   HGBA1C 6.9 (H) 04/30/2017   HGBA1C 7.9 (H) 04/19/2016   HGBA1C 6.8 (H) 02/09/2016  02/25/2021: HbA1c 8.3%  Pt is on a regimen of: - Metformin ER 2000 mg daily >> 1000-500-500 mg  - Jardiance 25 mg daily - Trulicity 3 >> 1.5 mg weekly (dose prev. decreased because of lows) >> 3 mg weekly >> Ozempic 1 mg weekly >> Mounjaro 5 mg weekly (started 1 mo ago) He was on glipizide in the past but this caused palpitations.  Pt checks his sugars >4x a day with his CGM:   Previously:   Prev.:     Lowest sugar was 60 >> 80s >> 75 >> 82; he has hypoglycemia awareness at 100.  Highest sugar was 300 (pancakes) >> 280 >> 250 >> 280s.  Glucometer: Accu-Chek  - no  CKD, last BUN/creatinine:  11/06/2023: 17/0.91, GFR 94, glucose 98 Lab Results  Component Value Date   BUN 12 12/30/2018   BUN 9 01/07/2018   CREATININE 1.26 (H) 12/30/2018   CREATININE 0.93 01/07/2018   Lab Results  Component Value Date   MICRALBCREAT 3 07/31/2023   -+ Mixed HL; last set of lipids: 11/06/2023: 148/104/43/78  Lipid Panel w/reflex Reviewed date:10/30/2022 03:43:03 PM Interpretation: Performing Lab: Notes/Report: Testing Performed at: [facility], [address], [address], [place], [place] [address]  Cholesterol 127 <200 mg/dL  CHOL/HDL 3.4 1.9-1.4 Ratio  HDLD 37 30-70 mg/dL  Triglyceride 782 9-562 mg/dL  NHDL 90 1-308 mg/dL  LDL Chol Calc (NIH) 29 0-99 mg/dL   65/78/4696: 295/284/13/24 02/25/2021: 162/329/41/69 No results found for: "CHOL", "HDL", "LDLCALC", "LDLDIRECT", "TRIG", "CHOLHDL"  - last eye exam was: 03/2023. No DR reportedly.   - + numbness and tingling in his feet.  Last foot exam 10/17/2023 by Dr. Charlsie Merles.  He has a spinal cord stimulator.  He also has a history of OSA, obesity class III, hepatic steatosis, right MCA aneurysm, migraine, GERD.  04/28/2022: TSH 2.25  ROS: + see HPI  Past Medical History:  Diagnosis Date   Aneurysm (HCC)    Right MCA.    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   Diabetes (HCC)    Type II   Diabetic neuropathy (HCC)    GERD (gastroesophageal reflux disease)    History of hiatal hernia    Hypersomnia with sleep apnea, unspecified 09/08/2013   Joint disorder    degenerated   Migraine    Obesity (BMI 35.0-39.9 without comorbidity)    Recurrent sinus infections    Skin cancer    Sleep apnea    saw Dr Vickey Huger in 09/2013; wears CPAP set at 14   TMJ (temporomandibular joint disorder)    Past Surgical History:  Procedure Laterality Date   ARTHOSCOPIC ROTAOR CUFF REPAIR Right  01/07/2018   Procedure: RIGHT SHOULDER ARTHROSCOPY, SUBACROMIAL DECOMPRESSION, OPEN ROTATOR CUFF REPAIR, OPEN DISTAL CLAVICLE EXCISION, BICEPS TENODESIS, SLAP DEBRIDMENT;  Surgeon: Beverely Low, MD;  Location: [facility] OR;  Service: Orthopedics;  Laterality: Right;   CARPAL TUNNEL RELEASE Right    COLONOSCOPY W/ BIOPSIES AND POLYPECTOMY     IR ANGIO EXTRACRAN SEL COM CAROTID INNOMINATE UNI R MOD SED  05/02/2017   IR GENERIC HISTORICAL  04/24/2016   IR TRANSCATH/EMBOLIZ 04/24/2016 Julieanne Cotton, MD [facility]   IR GENERIC HISTORICAL  04/24/2016   IR ANGIOGRAM FOLLOW UP STUDY 04/24/2016 Julieanne Cotton, MD [facility]   IR GENERIC HISTORICAL  04/24/2016   IR NEURO EACH ADD'L AFTER BASIC UNI RIGHT (MS) 04/24/2016 Julieanne Cotton, MD [facility]   IR GENERIC HISTORICAL  04/24/2016   IR ANGIO INTRA EXTRACRAN SEL INTERNAL CAROTID UNI R MOD SED 04/24/2016 Julieanne Cotton, MD [facility]   IR GENERIC HISTORICAL  05/19/2016   IR RADIOLOGIST EVAL & MGMT 05/19/2016 [facility]   IR GENERIC HISTORICAL  08/24/2016   IR ANGIO VERTEBRAL SEL SUBCLAVIAN INNOMINATE UNI R MOD SED 08/24/2016 Julieanne Cotton, MD [facility]   IR GENERIC HISTORICAL  08/24/2016   IR ANGIO INTRA EXTRACRAN SEL COM CAROTID INNOMINATE BILAT MOD SED 08/24/2016 Julieanne Cotton, MD [facility]   IR RADIOLOGIST EVAL & MGMT  11/29/2017   KNEE ARTHROSCOPY Right    LUMBAR DISC SURGERY     MOHS SURGERY     basal cell face   RADIOLOGY WITH ANESTHESIA N/A 04/24/2016   Procedure: EMBOLIZATION;  Surgeon: Julieanne Cotton, MD;  Location: [facility];  Service: Radiology;  Laterality: N/A;   RADIOLOGY WITH ANESTHESIA N/A 05/02/2017   Procedure: RADIOLOGY WITH ANESTHESIA     EMBOLIZATION;  Surgeon: Julieanne Cotton, MD;  Location: [facility] OR;  Service: Radiology;  Laterality: N/A;   TONSILLECTOMY     Social History   Socioeconomic History   Marital status: Married    Spouse name: elizabeth   Number of children: 2   Years of education:  Bach   Highest education level: Not on file  Occupational History   Occupation: [employer]  Tobacco Use   Smoking status: Former    Current packs/day: 0.00    Types: Cigarettes    Quit date: 05/01/1987    Years since quitting: 36.6   Smokeless tobacco: Never   Tobacco comments:    quit smoking 30 years ago 02/09/16  Vaping Use   Vaping status: Never Used  Substance and Sexual Activity   Alcohol use: Not Currently    Comment: occasional   Drug use: No   Sexual activity: Not on file  Other Topics Concern   Not on file  Social History Narrative   Lives at home with wife    Caffeine use- coffee, 5 cups daily   Social Drivers of Health   Financial Resource Strain: Low Risk  (02/12/2023)   Received from [facility], [facility]   Overall Financial Resource Strain (CARDIA)    Difficulty of Paying Living Expenses: Not very hard  Food Insecurity: No Food Insecurity (02/12/2023)   Received from [facility], [facility]   Hunger Vital Sign    Worried About Running Out of Food in the Last Year: Never true    Ran Out of Food in the Last Year: Never true  Transportation Needs: No Transportation Needs (02/12/2023)   Received from [facility], [facility]   PRAPARE - Transportation    Lack of Transportation (Medical): No    Lack of Transportation (Non-Medical): No  Physical Activity: Insufficiently Active (02/12/2023)   Received from [facility], [facility]   Exercise Vital Sign    Days of Exercise per Week: 1 day    Minutes of Exercise per Session: 20 min  Stress: No Stress Concern Present (02/12/2023)   Received from [facility], [facility] - Occupational Stress Questionnaire    Feeling of Stress : Only a little  Social Connections: Somewhat Isolated (02/12/2023)   Received from [facility], [facility]   Social Network    How would you rate your social network (family, work, friends)?: Restricted participation with some  degree of social isolation  Intimate Partner Violence: Not At Risk (02/12/2023)   Received from [facility], [facility]   HITS    Over the last 12 months how often did your partner physically hurt you?: Never    Over the last 12 months how often did your partner insult you or talk down to you?: Rarely    Over the last 12 months how often did your partner threaten you with physical harm?: Never    Over the last 12 months how often did your partner scream or curse at you?: Rarely   Current Outpatient Medications on File Prior to Visit  Medication Sig Dispense Refill   aspirin 81 MG tablet Take 81 mg by mouth daily.      baclofen (LIORESAL) 10 MG tablet Take 10 mg by mouth.     calcium carbonate (TUMS - DOSED IN MG ELEMENTAL CALCIUM) 500 MG chewable tablet Chew 1 tablet by mouth daily as needed for indigestion or heartburn.     Continuous Glucose Sensor (DEXCOM G7 SENSOR) MISC USE AS INSTRUCTED TO CHECK BLOOD SUGAR DAILY. CHANGE EVERY 10 DAYS. 9 each 3   Fremanezumab-vfrm (AJOVY) 225 MG/1.5ML SOAJ Inject 225 mg into the skin every 30 (thirty) days.     hydrochlorothiazide (MICROZIDE) 12.5 MG capsule Take 12.5 mg by mouth daily.     JARDIANCE 25 MG TABS tablet TAKE 1 TABLET BY MOUTH DAILY BEFORE BREAKFAST. 90 tablet 3   metFORMIN (GLUCOPHAGE-XR) 500 MG 24 hr tablet TAKE 4 TABLETS (2,000 MG TOTAL) BY MOUTH DAILY 360 tablet 1   metoprolol tartrate (LOPRESSOR) 25 MG tablet TAKE 1 TABLET (25 MG TOTAL) BY MOUTH 2 (TWO) TIMES DAILY AS NEEDED FOR SUSTAINED PALPITATIONS 180 tablet 3   pregabalin (LYRICA) 200 MG capsule Take 200 mg by mouth 3 (three) times daily.     pyridOXINE (VITAMIN B-6) 100 MG tablet Take 100 mg by mouth daily.     tirzepatide [facility]) 5 MG/0.5ML Pen Inject 5 mg into the skin once  a week. 2 mL 3   DULoxetine (CYMBALTA) 30 MG capsule Take 30 mg by mouth daily. (Patient not taking: Reported on 11/30/2023)     Current Facility-Administered Medications on File Prior to Visit   Medication Dose Route Frequency Provider Last Rate Last Admin   gadopentetate dimeglumine (MAGNEVIST) injection 20 mL  20 mL Intravenous Once PRN Penumalli, Glenford Bayley, MD       Allergies  Allergen Reactions   Clopidogrel Other (See Comments)    " bleeding and chest burning" Burning chest and eye started bleeding   Keflex [Cephalexin] Anaphylaxis and Swelling    Throat swelling   Ivp Dye [Iodinated Contrast Media] Hives    Dye used in the 80s    Verapamil Palpitations    AV BLOCK   Glipizide Palpitations and Other (See Comments)    "Makes heart feel funny"    Family History  Problem Relation Age of Onset   Seizures Mother    Schizophrenia Father    Cancer Paternal Grandfather    Diabetes Neg Hx    PE: BP 120/70   Pulse 79   Ht 5\' 7"  (1.702 m)   Wt 211 lb 9.6 oz (96 kg)   SpO2 96%   BMI 33.14 kg/m  Wt Readings from Last 3 Encounters:  11/30/23 211 lb 9.6 oz (96 kg)  08/13/23 212 lb (96.2 kg)  07/31/23 217 lb 6.4 oz (98.6 kg)   Constitutional: overweight, in NAD Eyes: no exophthalmos ENT: no thyromegaly, no cervical lymphadenopathy Cardiovascular: RRR, No MRG Respiratory: CTA B Musculoskeletal: no deformities Skin: no rashes Neurological: no tremor with outstretched hands  ASSESSMENT: 1. DM2, non-insulin-dependent, uncontrolled, with long-term complications - PN - s/p spinal cord stimulator placement  2. HL  PLAN:  1. Patient with longstanding, uncontrolled, type 2 diabetes, on oral antidiabetic regimen with metformin and SGLT2 inhibitor along with weekly GLP-1 receptor agonist.  He could not tolerate higher doses of Trulicity due to nausea, so he continued on 3 mg weekly.  However, since last visit, he was able to switch to [facility] 5 mg weekly. - At last visit, sugars were improved with only occasional higher blood sugars after breakfast and after dinner but the majority of the blood sugar still at goal.  We did not change his regimen.  HbA1c at that time was  lower, at 7.2%. CGM interpretation: -At today's visit, we reviewed his CGM downloads: It appears that 79% of values are in target range (goal >70%), while 21% are higher than 180 (goal <25%), and 0% are lower than 70 (goal <4%).  The calculated average blood sugar is 158.  The projected HbA1c for the next 3 months (GMI) is 7.1%. -Reviewing the CGM trends, sugars appear to be fluctuating in the upper half of the target range with hyperglycemic peaks after all 3 meals.  Upon questioning, he has been traveling recently and has been eating out more.  Indeed, reviewing the blood sugars in the 2 weeks prior to the last 2 weeks period, sugars were almost all at target.  Therefore, for now, I did not suggest a change in regimen.  I am hoping that we can continue with [facility], but he is not sure whether this is covered by his insurance or not.  I refilled this for him today and advised him to let me know.  If it is not covered, we may need to go back to Trulicity - I suggested to:  Patient Instructions  Please continue: - Metformin ER 2000  mg daily - Jardiance 25 mg daily  Try to continue: - Mounjaro 5 mg weekly   Continue Fish oil 1000 mg 2x a day.  Please return in 3-4 months.  - we checked his HbA1c: 6.8% (lower) - advised to check sugars at different times of the day - 4x a day, rotating check times - advised for yearly eye exams >> he is UTD - return to clinic in 3-4 months  2. HL -Reviewed the lipid panels from 10/2022 in 04/2023: Fractions at goal with the exception of high triglycerides -He had another lipid panel on 11/06/2023: 148/104/43/78 -LDL only slightly higher than target.  Otherwise fractions at goal. -He previously mentions that his LDL is usually at goal and he did not have to use a statin.  We did discuss that statins are usually recommended in the setting of diabetes, with guidelines varying between recommending a certain target or a certain percentage decrease in LDL. I  recommended to continue fish oil 1000 mg twice a day to help with his high triglycerides -He continues to be adamant about not start a statin as he read that this could cause neurological problems.  Carlus Pavlov, MD PhD [facility] Endocrinology

## 2023-11-30 NOTE — Patient Instructions (Addendum)
 Please continue: - Metformin ER 2000 mg daily - Jardiance 25 mg daily  Try to continue: - Mounjaro 5 mg weekly   Continue Fish oil 1000 mg 2x a day.  Please return in 3-4 months.

## 2024-01-30 ENCOUNTER — Other Ambulatory Visit: Payer: Self-pay | Admitting: Internal Medicine

## 2024-01-30 DIAGNOSIS — E1142 Type 2 diabetes mellitus with diabetic polyneuropathy: Secondary | ICD-10-CM

## 2024-01-30 NOTE — Telephone Encounter (Signed)
 Requested Prescriptions   Pending Prescriptions Disp Refills   Continuous Glucose Sensor (DEXCOM G7 SENSOR) MISC [Pharmacy Med Name: DEXCOM G7 SENSOR] 9 each 3    Sig: USE AS INSTRUCTED TO CHECK BLOOD SUGAR DAILY. CHANGE EVERY 10 DAYS.

## 2024-03-26 ENCOUNTER — Ambulatory Visit: Admitting: Podiatry

## 2024-04-03 ENCOUNTER — Encounter: Payer: Self-pay | Admitting: Internal Medicine

## 2024-04-03 ENCOUNTER — Ambulatory Visit: Admitting: Internal Medicine

## 2024-04-03 VITALS — BP 120/60 | HR 74 | Ht 67.0 in | Wt 207.8 lb

## 2024-04-03 DIAGNOSIS — E1142 Type 2 diabetes mellitus with diabetic polyneuropathy: Secondary | ICD-10-CM

## 2024-04-03 DIAGNOSIS — E785 Hyperlipidemia, unspecified: Secondary | ICD-10-CM

## 2024-04-03 DIAGNOSIS — Z7984 Long term (current) use of oral hypoglycemic drugs: Secondary | ICD-10-CM | POA: Diagnosis not present

## 2024-04-03 DIAGNOSIS — Z7985 Long-term (current) use of injectable non-insulin antidiabetic drugs: Secondary | ICD-10-CM

## 2024-04-03 LAB — POCT GLYCOSYLATED HEMOGLOBIN (HGB A1C): Hemoglobin A1C: 6.5 % — AB (ref 4.0–5.6)

## 2024-04-03 NOTE — Progress Notes (Signed)
 Patient ID: Nicholas Wheeler, male   DOB: 1961-04-15, 63 y.o.   MRN: [id number]  HPI: Nicholas Wheeler is a 63 y.o.-year-old pleasant male, returning for follow-up for DM2, dx in 2011, non-insulin -dependent, uncontrolled, with complications (peripheral neuropathy). Pt. previously saw Dr. Kassie, but last visit with me 4 months ago.  Interim history: No blurry vision, increased urination, nausea.  He has severe DDD in the upper and lower back. He has pain in feet when looking down. He saw pain clinics in the past and was on Methadone in the past.  He has a cord stimulator in the lower spine but this is not helping with the pain in his upper back and the pain radiating down his arm and legs.  Insurance is not covering the surgery for him.  He is on long-term disability. He is now looking for a primary care doctor.  [facility] is not in network anymore.  Reviewed HbA1c: Lab Results  Component Value Date   HGBA1C 6.8 (A) 11/30/2023   HGBA1C 7.2 (A) 07/31/2023   HGBA1C 7.8 03/26/2023   HGBA1C 7.2 (A) 11/23/2022   HGBA1C 7.7 (A) 07/25/2022   HGBA1C 7.0 (A) 03/24/2022   HGBA1C 7.0 (A) 12/19/2021   HGBA1C 7.9 (A) 10/13/2021   HGBA1C 8.1 (A) 08/11/2021   HGBA1C 9.1 (A) 06/09/2021   HGBA1C 6.9 (H) 04/30/2017   HGBA1C 7.9 (H) 04/19/2016   HGBA1C 6.8 (H) 02/09/2016  02/25/2021: HbA1c 8.3%  Pt is on a regimen of: - Metformin  ER 2000 mg daily >> 1000-500-500 mg  - Jardiance  25 mg daily - Trulicity  3 >> 1.5 mg weekly (dose prev. decreased because of lows) >> 3 mg weekly >> Ozempic  1 mg weekly >> Mounjaro  5 mg weekly  He was on glipizide in the past but this caused palpitations.  Pt checks his sugars >4x a day with his CGM:  Previously:  Previously:   Lowest sugar was 75 >> 82 >> 50s (activity); he has hypoglycemia awareness at 100.  Highest sugar was 300 (pancakes) >> ... 280s >>  240.  Glucometer: Accu-Chek  - no CKD, last BUN/creatinine:  11/06/2023: 17/0.91, GFR 94, glucose 98 Lab Results   Component Value Date   BUN 12 12/30/2018   BUN 9 01/07/2018   CREATININE 1.26 (H) 12/30/2018   CREATININE 0.93 01/07/2018   Lab Results  Component Value Date   MICRALBCREAT 3 07/31/2023   -+ Mixed HL; last set of lipids: 11/06/2023: 148/104/43/78   No results found for: CHOL, HDL, LDLCALC, LDLDIRECT, TRIG, CHOLHDL  - last eye exam was: 92/7974. No DR reportedly.   - + numbness and tingling in his feet.  Last foot exam 10/17/2023 by Dr. Magdalen.  He has a spinal cord stimulator.  He also has a history of OSA, obesity class III, hepatic steatosis, right MCA aneurysm, migraine, GERD.  04/28/2022: TSH 2.25  ROS: + see HPI  Past Medical History:  Diagnosis Date   Aneurysm (HCC)    Right MCA.    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   Diabetes (HCC)    Type II   Diabetic neuropathy (HCC)    GERD (gastroesophageal reflux disease)    History of hiatal hernia    Hypersomnia with sleep apnea, unspecified 09/08/2013   Joint disorder    degenerated   Migraine    Obesity (  BMI 35.0-39.9 without comorbidity)    Recurrent sinus infections    Skin cancer    Sleep apnea    saw Dr Chalice in 09/2013; wears CPAP set at 14   TMJ (temporomandibular joint disorder)    Past Surgical History:  Procedure Laterality Date   ARTHOSCOPIC ROTAOR CUFF REPAIR Right 01/07/2018   Procedure: RIGHT SHOULDER ARTHROSCOPY, SUBACROMIAL DECOMPRESSION, OPEN ROTATOR CUFF REPAIR, OPEN DISTAL CLAVICLE EXCISION, BICEPS TENODESIS, SLAP DEBRIDMENT;  Surgeon: Kay Kemps, MD;  Location: [facility] OR;  Service: Orthopedics;  Laterality: Right;   CARPAL TUNNEL RELEASE Right    COLONOSCOPY W/ BIOPSIES AND POLYPECTOMY     IR ANGIO EXTRACRAN SEL COM CAROTID INNOMINATE UNI R MOD SED  05/02/2017   IR GENERIC HISTORICAL  04/24/2016   IR TRANSCATH/EMBOLIZ 04/24/2016 Thyra Nash, MD  [facility]   IR GENERIC HISTORICAL  04/24/2016   IR ANGIOGRAM FOLLOW UP STUDY 04/24/2016 Thyra Nash, MD [facility]   IR GENERIC HISTORICAL  04/24/2016   IR NEURO EACH ADD'L AFTER BASIC UNI RIGHT (MS) 04/24/2016 Thyra Nash, MD [facility]   IR GENERIC HISTORICAL  04/24/2016   IR ANGIO INTRA EXTRACRAN SEL INTERNAL CAROTID UNI R MOD SED 04/24/2016 Thyra Nash, MD [facility]   IR GENERIC HISTORICAL  05/19/2016   IR RADIOLOGIST EVAL & MGMT 05/19/2016 [facility]   IR GENERIC HISTORICAL  08/24/2016   IR ANGIO VERTEBRAL SEL SUBCLAVIAN INNOMINATE UNI R MOD SED 08/24/2016 Thyra Nash, MD [facility]   IR GENERIC HISTORICAL  08/24/2016   IR ANGIO INTRA EXTRACRAN SEL COM CAROTID INNOMINATE BILAT MOD SED 08/24/2016 Thyra Nash, MD [facility]   IR RADIOLOGIST EVAL & MGMT  11/29/2017   KNEE ARTHROSCOPY Right    LUMBAR DISC SURGERY     MOHS SURGERY     basal cell face   RADIOLOGY WITH ANESTHESIA N/A 04/24/2016   Procedure: EMBOLIZATION;  Surgeon: Thyra Nash, MD;  Location: [facility] OR;  Service: Radiology;  Laterality: N/A;   RADIOLOGY WITH ANESTHESIA N/A 05/02/2017   Procedure: RADIOLOGY WITH ANESTHESIA     EMBOLIZATION;  Surgeon: Nash Thyra, MD;  Location: [facility] OR;  Service: Radiology;  Laterality: N/A;   TONSILLECTOMY     Social History   Socioeconomic History   Marital status: Married    Spouse name: elizabeth   Number of children: 2   Years of education: Bach   Highest education level: Not on file  Occupational History   Occupation: [employer]  Tobacco Use   Smoking status: Former    Current packs/day: 0.00    Types: Cigarettes    Quit date: 05/01/1987    Years since quitting: 36.9   Smokeless tobacco: Never   Tobacco comments:    quit smoking 30 years ago 02/09/16  Vaping Use   Vaping status: Never Used  Substance and Sexual Activity   Alcohol use: Not Currently    Comment: occasional   Drug use: No   Sexual activity: Not on file  Other  Topics Concern   Not on file  Social History Narrative   Lives at home with wife    Caffeine use- coffee, 5 cups daily   Social Drivers of Health   Financial Resource Strain: Low Risk  (02/04/2024)   Received from [facility]   Overall Financial Resource Strain (CARDIA)    Difficulty of Paying Living Expenses: Not hard at all  Food Insecurity: No Food Insecurity (02/04/2024)   Received from [facility]   Hunger Vital Sign  Within the past 12 months, you worried that your food would run out before you got the money to buy more.: Never true    Within the past 12 months, the food you bought just didn't last and you didn't have money to get more.: Never true  Transportation Needs: No Transportation Needs (02/04/2024)   Received from [facility]   PRAPARE - Transportation    Lack of Transportation (Medical): No    Lack of Transportation (Non-Medical): No  Physical Activity: Insufficiently Active (02/12/2023)   Received from [facility]   Exercise Vital Sign    On average, how many days per week do you engage in moderate to strenuous exercise (like a brisk walk)?: 1 day    On average, how many minutes do you engage in exercise at this level?: 20 min  Stress: No Stress Concern Present (02/12/2023)   Received from [facility] - Occupational Stress Questionnaire    Feeling of Stress : Only a little  Social Connections: Somewhat Isolated (02/12/2023)   Received from [facility]   Social Network    How would you rate your social network (family, work, friends)?: Restricted participation with some degree of social isolation  Intimate Partner Violence: Not At Risk (02/12/2023)   Received from [facility]   HITS    Over the last 12 months how often did your partner physically hurt you?: Never    Over the last 12 months how often did your partner insult you or talk down to you?: Rarely    Over the last 12 months how often did your partner  threaten you with physical harm?: Never    Over the last 12 months how often did your partner scream or curse at you?: Rarely   Current Outpatient Medications on File Prior to Visit  Medication Sig Dispense Refill   aspirin  81 MG tablet Take 81 mg by mouth daily.      calcium carbonate (TUMS - DOSED IN MG ELEMENTAL CALCIUM) 500 MG chewable tablet Chew 1 tablet by mouth daily as needed for indigestion or heartburn.     Continuous Glucose Sensor (DEXCOM G7 SENSOR) MISC USE AS INSTRUCTED TO CHECK BLOOD SUGAR DAILY. CHANGE EVERY 10 DAYS. 9 each 3   DULoxetine (CYMBALTA) 30 MG capsule Take 30 mg by mouth daily. (Patient not taking: Reported on 11/30/2023)     Fremanezumab-vfrm (AJOVY) 225 MG/1.5ML SOAJ Inject 225 mg into the skin every 30 (thirty) days.     hydrochlorothiazide (MICROZIDE) 12.5 MG capsule Take 12.5 mg by mouth daily.     JARDIANCE  25 MG TABS tablet TAKE 1 TABLET BY MOUTH DAILY BEFORE BREAKFAST. 90 tablet 3   metFORMIN  (GLUCOPHAGE -XR) 500 MG 24 hr tablet Take 4 tablets (2,000 mg total) by mouth daily. 360 tablet 3   metoprolol  tartrate (LOPRESSOR ) 25 MG tablet TAKE 1 TABLET (25 MG TOTAL) BY MOUTH 2 (TWO) TIMES DAILY AS NEEDED FOR SUSTAINED PALPITATIONS 180 tablet 3   pregabalin (LYRICA) 200 MG capsule Take 200 mg by mouth 3 (three) times daily.     pyridOXINE (VITAMIN B-6) 100 MG tablet Take 100 mg by mouth daily.     tirzepatide  (MOUNJARO ) 5 MG/0.5ML Pen Inject 5 mg into the skin once a week. 6 mL 3   Current Facility-Administered Medications on File Prior to Visit  Medication Dose Route Frequency Provider Last Rate Last Admin   gadopentetate dimeglumine  (MAGNEVIST ) injection 20 mL  20 mL Intravenous Once PRN Penumalli, Vikram  R, MD       Allergies  Allergen Reactions   Clopidogrel Other (See Comments)     bleeding and chest burning Burning chest and eye started bleeding   Keflex [Cephalexin] Anaphylaxis and Swelling    Throat swelling   Ivp Dye [Iodinated Contrast Media]  Hives    Dye used in the 80s    Verapamil Palpitations    AV BLOCK   Glipizide Palpitations and Other (See Comments)    Makes heart feel funny    Family History  Problem Relation Age of Onset   Seizures Mother    Schizophrenia Father    Cancer Paternal Grandfather    Diabetes Neg Hx    PE: BP 120/60   Pulse 74   Ht 5' 7 (1.702 m)   Wt 207 lb 12.8 oz (94.3 kg)   SpO2 98%   BMI 32.55 kg/m  Wt Readings from Last 15 Encounters:  04/03/24 207 lb 12.8 oz (94.3 kg)  11/30/23 211 lb 9.6 oz (96 kg)  08/13/23 212 lb (96.2 kg)  07/31/23 217 lb 6.4 oz (98.6 kg)  03/26/23 219 lb (99.3 kg)  11/23/22 227 lb 9.6 oz (103.2 kg)  08/15/22 233 lb 3.2 oz (105.8 kg)  07/25/22 236 lb 3.2 oz (107.1 kg)  03/24/22 233 lb 3.2 oz (105.8 kg)  12/19/21 234 lb (106.1 kg)  10/13/21 240 lb 3.2 oz (109 kg)  08/11/21 243 lb 9.6 oz (110.5 kg)  06/09/21 239 lb 9.6 oz (108.7 kg)  03/17/20 233 lb (105.7 kg)  12/30/18 233 lb (105.7 kg)  2015: 274 lbs  Constitutional: overweight, in NAD Eyes: no exophthalmos ENT: no thyromegaly, no cervical lymphadenopathy Cardiovascular: RRR, No MRG Respiratory: CTA B Musculoskeletal: no deformities Skin: no rashes Neurological: no tremor with outstretched hands  ASSESSMENT: 1. DM2, non-insulin -dependent, uncontrolled, with long-term complications - PN - s/p spinal cord stimulator placement  2. HL  PLAN:  1. Patient with longstanding,, type 2 diabetes, on oral antidiabetic regimen with metformin  and SGLT2 inhibitor along with weekly GLP-1/GIP receptor agonist (Mounjaro ), switched from GLP-1 receptor agonist (Trulicity , Ozempic ) before last visit.  When I last saw him, HbA1c was better, at 6.8%.  Sugars were fluctuating in the upper half of the target range with hyperglycemic peaks after all 3 meals.  This was likely related to traveling more and eating out, as in the 2 weeks prior to our last visit, sugars appear to have already improved.  He was not sure whether  Mounjaro  would be covered by his insurance going forward but at that time, I advised him to try to continue it. CGM interpretation: -At today's visit, we reviewed his CGM downloads: It appears that 92% of values are in target range (goal >70%), while 7% are higher than 180 (goal <25%), and 1% are lower than 70 (goal <4%).  The calculated average blood sugar is 139.  The projected HbA1c for the next 3 months (GMI) is 6.6%. -Reviewing the CGM trends, sugars are fluctuating within the target range with few mild hyperglycemic exceptions after meals.  He has an occasional low blood sugar, especially when active, but not in the last 10 days.  He feels well on the above regimen, without side effects other than mild nausea.  He does report memory problem but he feels that these are related to his neuropathy medications. -For now, we discussed about staying on the same regimen. - I suggested to:  Patient Instructions  Please continue: - Metformin  ER 2000 mg daily -  Jardiance  25 mg daily - Mounjaro  5 mg weekly   Continue Fish oil 1000 mg 2x a day.  Please return in 3-4 months.  - we checked his HbA1c: 6.5% (placed in many years) - advised to check sugars at different times of the day - 4x a day, rotating check times - advised for yearly eye exams >> he is UTD - return to clinic in 3-4 months  2. HL - Latest lipid panel reviewed from 11/2023: LDL slightly higher than target at 78, otherwise fractions at goal: 148/104/43/78  - He previously mentioned that his LDL was usually at goal and he did not have to use a statin.  We did discuss that statins are usually recommended in the setting of diabetes, with guidelines varying between recommending a certain target or a certain percentage decrease in LDL. I recommended to continue fish oil 1000 mg twice a day to help with his high triglycerides - He remains adamant about not started the statin as he read that this could cause neurological problems  Lela Fendt, MD PhD [facility] Endocrinology

## 2024-04-03 NOTE — Patient Instructions (Addendum)
 Please continue: - Metformin  ER 2000 mg daily - Jardiance  25 mg daily - Mounjaro  5 mg weekly   Continue Fish oil 1000 mg 2x a day.  Please return in 3-4 months (before 07/30/2024).

## 2024-04-30 ENCOUNTER — Telehealth: Payer: Self-pay | Admitting: Internal Medicine

## 2024-04-30 MED ORDER — TIRZEPATIDE 5 MG/0.5ML ~~LOC~~ SOAJ: 5.0000 mg | SUBCUTANEOUS | 3 refills | Status: DC

## 2024-04-30 NOTE — Telephone Encounter (Signed)
 I called and spoke with Nicholas Wheeler, she took the verbal and states that it could possibly take 5-7 days to get to the patient.   Requested Prescriptions   Signed Prescriptions Disp Refills   tirzepatide  (MOUNJARO ) 5 MG/0.5ML Pen 6 mL 3    Sig: Inject 5 mg into the skin once a week.    Authorizing Provider: TRIXIE FILE    Ordering User: CLEOTILDE ROLIN RAMAN

## 2024-04-30 NOTE — Telephone Encounter (Signed)
 Patient's mail in pharmacy - [facility]  called to get a verbal RX for 90 day RX for Mounjaro .  Patient is out - Pharmacy advises that retail is $55 per month in a retail store and is $55 per 90 day refill from [facility] mail in prescription.  [facility] rep advises they are able to send expedited shipping when the verbal RX is received. Per same an electronic RX will add 3-5 days to the processing time and patient will need to take shot Friday   Verbal RX for Mounjaro  5 MG - 90 day refill can be called in to [phone]

## 2024-07-26 ENCOUNTER — Other Ambulatory Visit: Payer: Self-pay | Admitting: Internal Medicine

## 2024-07-29 ENCOUNTER — Ambulatory Visit: Admitting: Internal Medicine

## 2024-08-07 ENCOUNTER — Ambulatory Visit: Admitting: Internal Medicine

## 2024-08-15 ENCOUNTER — Ambulatory Visit: Admitting: Internal Medicine

## 2024-09-03 ENCOUNTER — Encounter: Payer: Self-pay | Admitting: Internal Medicine

## 2024-09-09 NOTE — Progress Notes (Unsigned)
 Patient ID: Nicholas Wheeler, male   DOB: 04-17-1961, 64 y.o.   MRN: [id number]  HPI: Nicholas Wheeler is a 64 y.o.-year-old pleasant male, returning for follow-up for DM2, dx in 2011, non-insulin -dependent, uncontrolled, with complications (peripheral neuropathy). Pt. previously saw Dr. Kassie, but last visit with me 5 months ago.  Interim history: No blurry vision, no nausea, + increased urination. He has severe DDD in the upper and lower back. He has pain in feet when looking down. He saw pain clinics in the past and was on Methadone in the past.  He has a cord stimulator in the lower spine.  Insurance is not covering the surgery for him.  He is on long-term disability. He has an increased appetite - off Mounjaro . His last visit, he changed to the diabetes and cardiovascular insurance plan.  They are covering Mounjaro  with the preauthorization.  Reviewed HbA1c: Lab Results  Component Value Date   HGBA1C 6.5 (A) 04/03/2024   HGBA1C 6.8 (A) 11/30/2023   HGBA1C 7.2 (A) 07/31/2023   HGBA1C 7.8 03/26/2023   HGBA1C 7.2 (A) 11/23/2022   HGBA1C 7.7 (A) 07/25/2022   HGBA1C 7.0 (A) 03/24/2022   HGBA1C 7.0 (A) 12/19/2021   HGBA1C 7.9 (A) 10/13/2021   HGBA1C 8.1 (A) 08/11/2021   HGBA1C 9.1 (A) 06/09/2021   HGBA1C 6.9 (H) 04/30/2017   HGBA1C 7.9 (H) 04/19/2016   HGBA1C 6.8 (H) 02/09/2016  02/25/2021: HbA1c 8.3%  Pt is on a regimen of: - Metformin  ER 2000 mg daily >> 1000-500-500 mg  - Jardiance  25 mg daily - Trulicity  3 >> 1.5 mg weekly (dose prev. decreased because of lows) >> 3 mg weekly >> Ozempic  1 mg weekly >> Mounjaro  5 mg weekly  >> off 1.5-2 mo (new insurance, needs  a PA) He was on glipizide in the past but this caused palpitations.  Pt checks his sugars >4x a day with his CGM and manually after he ran out of sensors: - am: 140-160 - 2h after lunch: up to upper 200s  Previously:  Previously:   Lowest sugar was 50s (activity) >> 80; he has hypoglycemia awareness at 100.   Highest sugar was 300 (pancakes) >> ... 280s >> 240 >> upper 200s.  Glucometer: Accu-Chek  - no CKD, last BUN/creatinine:  11/06/2023: 17/0.91, GFR 94, glucose 98 Lab Results  Component Value Date   BUN 12 12/30/2018   BUN 9 01/07/2018   CREATININE 1.26 (H) 12/30/2018   CREATININE 0.93 01/07/2018   Lab Results  Component Value Date   MICRALBCREAT 3 07/31/2023   -+ Mixed HL; last set of lipids: 11/06/2023: 148/104/43/78   No results found for: CHOL, HDL, LDLCALC, LDLDIRECT, TRIG, CHOLHDL  - last eye exam was: 92/7974. No DR reportedly.   - + numbness and tingling in his feet.  Last foot exam 10/17/2023 by Dr. Magdalen.  Has heel spurs. He has a spinal cord stimulator.  He also has a history of OSA, obesity class III, hepatic steatosis, right MCA aneurysm, migraine, GERD.  04/28/2022: TSH 2.25  ROS: + see HPI  Past Medical History:  Diagnosis Date   Aneurysm    Right MCA.    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   Diabetes (HCC)    Type II   Diabetic neuropathy (HCC)    GERD (gastroesophageal reflux disease)  History of hiatal hernia    Hypersomnia with sleep apnea, unspecified 09/08/2013   Joint disorder    degenerated   Migraine    Obesity (BMI 35.0-39.9 without comorbidity)    Recurrent sinus infections    Skin cancer    Sleep apnea    saw Dr Chalice in 09/2013; wears CPAP set at 14   TMJ (temporomandibular joint disorder)    Past Surgical History:  Procedure Laterality Date   ARTHOSCOPIC ROTAOR CUFF REPAIR Right 01/07/2018   Procedure: RIGHT SHOULDER ARTHROSCOPY, SUBACROMIAL DECOMPRESSION, OPEN ROTATOR CUFF REPAIR, OPEN DISTAL CLAVICLE EXCISION, BICEPS TENODESIS, SLAP DEBRIDMENT;  Surgeon: Kay Kemps, MD;  Location: [facility] OR;  Service: Orthopedics;  Laterality: Right;   CARPAL TUNNEL RELEASE Right    COLONOSCOPY W/  BIOPSIES AND POLYPECTOMY     IR ANGIO EXTRACRAN SEL COM CAROTID INNOMINATE UNI R MOD SED  05/02/2017   IR GENERIC HISTORICAL  04/24/2016   IR TRANSCATH/EMBOLIZ 04/24/2016 Thyra Nash, MD [facility]   IR GENERIC HISTORICAL  04/24/2016   IR ANGIOGRAM FOLLOW UP STUDY 04/24/2016 Thyra Nash, MD [facility]   IR GENERIC HISTORICAL  04/24/2016   IR NEURO EACH ADD'L AFTER BASIC UNI RIGHT (MS) 04/24/2016 Thyra Nash, MD [facility]   IR GENERIC HISTORICAL  04/24/2016   IR ANGIO INTRA EXTRACRAN SEL INTERNAL CAROTID UNI R MOD SED 04/24/2016 Thyra Nash, MD [facility]   IR GENERIC HISTORICAL  05/19/2016   IR RADIOLOGIST EVAL & MGMT 05/19/2016 [facility]   IR GENERIC HISTORICAL  08/24/2016   IR ANGIO VERTEBRAL SEL SUBCLAVIAN INNOMINATE UNI R MOD SED 08/24/2016 Thyra Nash, MD [facility]   IR GENERIC HISTORICAL  08/24/2016   IR ANGIO INTRA EXTRACRAN SEL COM CAROTID INNOMINATE BILAT MOD SED 08/24/2016 Thyra Nash, MD [facility]   IR RADIOLOGIST EVAL & MGMT  11/29/2017   KNEE ARTHROSCOPY Right    LUMBAR DISC SURGERY     MOHS SURGERY     basal cell face   RADIOLOGY WITH ANESTHESIA N/A 04/24/2016   Procedure: EMBOLIZATION;  Surgeon: Thyra Nash, MD;  Location: [facility];  Service: Radiology;  Laterality: N/A;   RADIOLOGY WITH ANESTHESIA N/A 05/02/2017   Procedure: RADIOLOGY WITH ANESTHESIA     EMBOLIZATION;  Surgeon: Nash Thyra, MD;  Location: [facility] OR;  Service: Radiology;  Laterality: N/A;   TONSILLECTOMY     Social History   Socioeconomic History   Marital status: Married    Spouse name: elizabeth   Number of children: 2   Years of education: Bach   Highest education level: Not on file  Occupational History   Occupation: [employer]  Tobacco Use   Smoking status: Former    Current packs/day: 0.00    Types: Cigarettes    Quit date: 05/01/1987    Years since quitting: 37.3   Smokeless tobacco: Never   Tobacco comments:    quit smoking 30 years ago  02/09/16  Vaping Use   Vaping status: Never Used  Substance and Sexual Activity   Alcohol use: Not Currently    Comment: occasional   Drug use: No   Sexual activity: Not on file  Other Topics Concern   Not on file  Social History Narrative   Lives at home with wife    Caffeine use- coffee, 5 cups daily   Social Drivers of Health   Tobacco Use: Medium Risk (09/09/2024)   Received from [facility]   Patient History    Smoking Tobacco Use: Former  Smokeless Tobacco Use: Never    Passive Exposure: Not on file  Financial Resource Strain: Medium Risk (09/06/2024)   Received from [facility]   Overall Financial Resource Strain (CARDIA)    How hard is it for you to pay for the very basics like food, housing, medical care, and heating?: Somewhat hard  Food Insecurity: No Food Insecurity (09/06/2024)   Received from [facility]   Epic    Within the past 12 months, you worried that your food would run out before you got the money to buy more.: Never true    Within the past 12 months, the food you bought just didn't last and you didn't have money to get more.: Never true  Transportation Needs: No Transportation Needs (09/06/2024)   Received from [facility]    In the past 12 months, has lack of transportation kept you from medical appointments or from getting medications?: No    In the past 12 months, has lack of transportation kept you from meetings, work, or from getting things needed for daily living?: No  Physical Activity: Insufficiently Active (09/06/2024)   Received from [facility]   Exercise Vital Sign    On average, how many days per week do you engage in moderate to strenuous exercise (like a brisk walk)?: 3 days    On average, how many minutes do you engage in exercise at this level?: 20 min  Stress: Stress Concern Present (09/06/2024)   Received from [facility] - Occupational Stress Questionnaire    Do you feel stress -  tense, restless, nervous, or anxious, or unable to sleep at night because your mind is troubled all the time - these days?: To some extent  Social Connections: Somewhat Isolated (09/06/2024)   Received from [facility]   Social Network    How would you rate your social network (family, work, friends)?: Restricted participation with some degree of social isolation  Intimate Partner Violence: Not At Risk (09/06/2024)   Received from [facility]   HITS    Over the last 12 months how often did your partner physically hurt you?: Never    Over the last 12 months how often did your partner insult you or talk down to you?: Sometimes    Over the last 12 months how often did your partner threaten you with physical harm?: Never    Over the last 12 months how often did your partner scream or curse at you?: Sometimes  Depression (PHQ2-9): Not on file  Alcohol Screen: Not on file  Housing: Low Risk (09/06/2024)   Received from [facility]    In the last 12 months, was there a time when you were not able to pay the mortgage or rent on time?: No    In the past 12 months, how many times have you moved where you were living?: 0    At any time in the past 12 months, were you homeless or living in a shelter (including now)?: No  Utilities: Not At Risk (09/06/2024)   Received from [facility]    In the past 12 months has the electric, gas, oil, or water company threatened to shut off services in your home?: No  Health Literacy: Not on file   Current Outpatient Medications on File Prior to Visit  Medication Sig Dispense Refill   aspirin  81 MG tablet Take 81 mg by mouth daily.  baclofen (LIORESAL) 10 MG tablet Take 10 mg by mouth 3 (three) times daily.     calcium carbonate (TUMS - DOSED IN MG ELEMENTAL CALCIUM) 500 MG chewable tablet Chew 1 tablet by mouth daily as needed for indigestion or heartburn.     Continuous Glucose Sensor (DEXCOM G7 SENSOR) MISC USE AS INSTRUCTED TO CHECK BLOOD  SUGAR DAILY. CHANGE EVERY 10 DAYS. 9 each 3   DULoxetine (CYMBALTA) 30 MG capsule Take 30 mg by mouth daily. (Patient not taking: Reported on 11/30/2023)     Fremanezumab-vfrm (AJOVY) 225 MG/1.5ML SOAJ Inject 225 mg into the skin every 30 (thirty) days.     hydrochlorothiazide (MICROZIDE) 12.5 MG capsule Take 12.5 mg by mouth daily.     JARDIANCE  25 MG TABS tablet TAKE 1 TABLET BY MOUTH EVERY DAY BEFORE BREAKFAST 90 tablet 3   metFORMIN  (GLUCOPHAGE -XR) 500 MG 24 hr tablet Take 4 tablets (2,000 mg total) by mouth daily. 360 tablet 3   metoprolol  tartrate (LOPRESSOR ) 25 MG tablet TAKE 1 TABLET (25 MG TOTAL) BY MOUTH 2 (TWO) TIMES DAILY AS NEEDED FOR SUSTAINED PALPITATIONS 180 tablet 3   OXcarbazepine (TRILEPTAL) 150 MG tablet Take 150 mg by mouth 2 (two) times daily.     pregabalin (LYRICA) 200 MG capsule Take 200 mg by mouth 3 (three) times daily.     pyridOXINE (VITAMIN B-6) 100 MG tablet Take 100 mg by mouth daily.     tirzepatide  (MOUNJARO ) 5 MG/0.5ML Pen Inject 5 mg into the skin once a week. 6 mL 3   Current Facility-Administered Medications on File Prior to Visit  Medication Dose Route Frequency Provider Last Rate Last Admin   gadopentetate dimeglumine  (MAGNEVIST ) injection 20 mL  20 mL Intravenous Once PRN Penumalli, Vikram R, MD       Allergies  Allergen Reactions   Clopidogrel Other (See Comments)     bleeding and chest burning Burning chest and eye started bleeding   Keflex [Cephalexin] Anaphylaxis and Swelling    Throat swelling   Ivp Dye [Iodinated Contrast Media] Hives    Dye used in the 80s    Verapamil Palpitations    AV BLOCK   Glipizide Palpitations and Other (See Comments)    Makes heart feel funny    Family History  Problem Relation Age of Onset   Seizures Mother    Schizophrenia Father    Cancer Paternal Grandfather    Diabetes Neg Hx    PE: BP 124/72   Pulse 77   Resp 18   Ht 5' 7 (1.702 m)   Wt 212 lb 9.6 oz (96.4 kg)   SpO2 98%   BMI 33.30 kg/m   Wt Readings from Last 15 Encounters:  09/10/24 212 lb 9.6 oz (96.4 kg)  04/03/24 207 lb 12.8 oz (94.3 kg)  11/30/23 211 lb 9.6 oz (96 kg)  08/13/23 212 lb (96.2 kg)  07/31/23 217 lb 6.4 oz (98.6 kg)  03/26/23 219 lb (99.3 kg)  11/23/22 227 lb 9.6 oz (103.2 kg)  08/15/22 233 lb 3.2 oz (105.8 kg)  07/25/22 236 lb 3.2 oz (107.1 kg)  03/24/22 233 lb 3.2 oz (105.8 kg)  12/19/21 234 lb (106.1 kg)  10/13/21 240 lb 3.2 oz (109 kg)  08/11/21 243 lb 9.6 oz (110.5 kg)  06/09/21 239 lb 9.6 oz (108.7 kg)  03/17/20 233 lb (105.7 kg)  2015: 274 lbs  Constitutional: overweight, in NAD Eyes: no exophthalmos ENT: no thyromegaly, no cervical lymphadenopathy Cardiovascular: RRR, No MRG Respiratory: CTA  B Musculoskeletal: no deformities Skin: no rashes Neurological: no tremor with outstretched hands Diabetic Foot Exam - Simple   Simple Foot Form Diabetic Foot exam was performed with the following findings: Yes 09/10/2024  8:26 AM  Visual Inspection No deformities, no ulcerations, no other skin breakdown bilaterally: Yes Sensation Testing See comments: Yes Pulse Check Posterior Tibialis and Dorsalis pulse intact bilaterally: Yes Comments Very decreased sensation to monofilament and almost no sensation in B toes    ASSESSMENT: 1. DM2, non-insulin -dependent, uncontrolled, with long-term complications - PN - s/p spinal cord stimulator placement  2. HL  PLAN:  1. Patient with longstanding, previously uncontrolled, type 2 diabetes, on metformin , SGLT2 inhibitor and weekly GLP-1/GIP receptor agonist, with improving control.  At last visit, 5 months ago, his HbA1c was lower, at 6.5% decreased from 6.8%.  Sugars were fluctuating within the target range with few mild hyperglycemic exceptions after meals but also occasional low blood sugars especially when active.  He felt well on the regimen except for occasional mild nausea.  We discussed about continuing the same regimen at that time. CGM  interpretation: -At today's visit, we reviewed his CGM downloads: It appears that 84% of values are in target range (goal >70%), while 15% are higher than 180 (goal <25%), and 1% are lower than 70 (goal <4%).  The calculated average blood sugar is 147.  The projected HbA1c for the next 3 months (GMI) is 6.8%. -Reviewing the CGM trends up to 1 month ago (he ran out of sensors afterwards), sugars were mostly fluctuating within the target range with only occasional hyperglycemic spikes to the breakfast and dinner.  However, around that time, he ran out of Mounjaro  after changing his insurance.  His new insurance is covering it with a PA.  Since sugars are elevated, both in the morning and after meals, I sent a new prescription for the Mounjaro  to his pharmacy and will direct this to the PA team depending on the response.  Judging by the blood sugars from approximately a month ago, his previous regimen was working well so we can just restart the Mounjaro  for now, without making other changes in his regimen. - I also sent a new prescription for his sensor.  If his insurance prefers the freestyle libre 3, we can switch to this, but he would prefer to continue with the Dexcom CGM if possible. - I suggested to:  Patient Instructions  Please continue: - Metformin  ER 2000 mg daily - Jardiance  25 mg daily  Try to restart: - Mounjaro  5 mg weekly   Restart the Dexcom sensor.  Continue Fish oil 1000 mg 2x a day.  Please return in 3-4 months.   - we checked his HbA1c: 7.5% (higher) - advised to check sugars at different times of the day - 4x a day, rotating check times - advised for yearly eye exams >> he is UTD - he was able to get back to seeing Dr. Seabron after he changed his insurance.  He has an appointment coming up.  He will have annual labs at that time. - return to clinic in 3-4 months  2. HL - Reviewed latest lipid panel from 11/2023: LDL slightly higher than target, at 78, otherwise fractions  at goal: 148/104/43/78  No results found for: CHOL, HDL, LDLCALC, LDLDIRECT, TRIG, CHOLHDL -At last visit, he was off statins and was adamant about not starting at them since he read that they could cause neurological problems - We did discuss that statins are usually  recommended in the setting of diabetes, with guidelines varying between recommending a certain target or a certain percentage decrease in LDL.  At last visit I recommended to at least continue fish oil 1000 mg twice a day to help with his high triglycerides.  He continues on this.  Lela Fendt, MD PhD [facility] Endocrinology

## 2024-09-10 ENCOUNTER — Ambulatory Visit (INDEPENDENT_AMBULATORY_CARE_PROVIDER_SITE_OTHER): Admitting: Internal Medicine

## 2024-09-10 ENCOUNTER — Telehealth: Payer: Self-pay

## 2024-09-10 ENCOUNTER — Encounter: Payer: Self-pay | Admitting: Internal Medicine

## 2024-09-10 ENCOUNTER — Other Ambulatory Visit (HOSPITAL_COMMUNITY): Payer: Self-pay

## 2024-09-10 VITALS — BP 124/72 | HR 77 | Resp 18 | Ht 67.0 in | Wt 212.6 lb

## 2024-09-10 DIAGNOSIS — Z7985 Long-term (current) use of injectable non-insulin antidiabetic drugs: Secondary | ICD-10-CM | POA: Diagnosis not present

## 2024-09-10 DIAGNOSIS — E1165 Type 2 diabetes mellitus with hyperglycemia: Secondary | ICD-10-CM | POA: Diagnosis not present

## 2024-09-10 DIAGNOSIS — Z7984 Long term (current) use of oral hypoglycemic drugs: Secondary | ICD-10-CM | POA: Diagnosis not present

## 2024-09-10 DIAGNOSIS — E785 Hyperlipidemia, unspecified: Secondary | ICD-10-CM

## 2024-09-10 DIAGNOSIS — E1142 Type 2 diabetes mellitus with diabetic polyneuropathy: Secondary | ICD-10-CM

## 2024-09-10 LAB — POCT GLYCOSYLATED HEMOGLOBIN (HGB A1C): Hemoglobin A1C: 7.5 % — AB (ref 4.0–5.6)

## 2024-09-10 MED ORDER — TIRZEPATIDE 5 MG/0.5ML ~~LOC~~ SOAJ: 5.0000 mg | SUBCUTANEOUS | 3 refills | Status: AC

## 2024-09-10 MED ORDER — DEXCOM G7 SENSOR MISC: 3 refills | Status: DC

## 2024-09-10 NOTE — Telephone Encounter (Signed)
 Pharmacy Patient Advocate Encounter  Received notification from [facility] [employer]VANT[employer]/RX [employer] that Prior Authorization for Mounjaro  5MG /0.5ML auto-injectors  has been APPROVED from 09/10/24 to 09/10/25. Ran test claim, Copay is $0.00. This test claim was processed through [facility]- copay amounts may vary at other pharmacies due to pharmacy/plan contracts, or as the patient moves through the different stages of their insurance plan.   PA #/Case ID/Reference #: [id number]

## 2024-09-10 NOTE — Telephone Encounter (Signed)
 Pharmacy Patient Advocate Encounter   Received notification from Patient Advice Request messages that prior authorization for Dexcom G7 sensor is required/requested.   Insurance verification completed.   The patient is insured through [facility] ADVANTAGE/RX ADVANCE.   Per test claim:  Freestyle libre 3 plus sensor is preferred by the insurance.  If suggested medication is appropriate, Please send in a new RX and discontinue this one. If not, please advise as to why it's not appropriate so that we may request a Prior Authorization. Please note, some preferred medications may still require a PA.  If the suggested medications have not been trialed and there are no contraindications to their use, the PA will not be submitted, as it will not be approved.   30 day copay for herlene is $0

## 2024-09-10 NOTE — Telephone Encounter (Signed)
 Patient is needing a PA initiated for Dexcom and Mounjaro  regarding his current conditions. Please advise on this!

## 2024-09-10 NOTE — Patient Instructions (Addendum)
 Please continue: - Metformin  ER 2000 mg daily - Jardiance  25 mg daily  Try to restart: - Mounjaro  5 mg weekly   Restart the Dexcom sensor.  Continue Fish oil 1000 mg 2x a day.  Please return in 3-4 months.

## 2024-09-10 NOTE — Addendum Note (Signed)
 Addended by: Tersea Aulds M on: 09/10/2024 08:40 AM   Modules accepted: Orders

## 2024-09-10 NOTE — Telephone Encounter (Signed)
 Pharmacy Patient Advocate Encounter   Received notification from Patient Advice Request messages that prior authorization for Mounjaro  5mg  is required/requested.   Insurance verification completed.   The patient is insured through [facility] ADVANTAGE/RX ADVANCE.   Per test claim: PA required; PA submitted to above mentioned insurance via Latent Key/confirmation #/EOC [facility] Status is pending

## 2024-09-11 MED ORDER — FREESTYLE LIBRE 3 PLUS SENSOR MISC: 1.0000 | 3 refills | Status: AC

## 2024-09-11 NOTE — Addendum Note (Signed)
 Addended by: CLEOTILDE ROLIN RAMAN on: 09/11/2024 10:28 AM   Modules accepted: Orders

## 2024-09-26 ENCOUNTER — Encounter: Payer: Self-pay | Admitting: Internal Medicine

## 2025-01-08 ENCOUNTER — Ambulatory Visit: Admitting: Internal Medicine
